# Patient Record
Sex: Male | Born: 1935
Health system: Southern US, Community
[De-identification: ages and names within clinical notes are randomized; demographics above are authoritative.]

## PROBLEM LIST (undated history)

## (undated) DIAGNOSIS — R351 Nocturia: Secondary | ICD-10-CM

## (undated) DIAGNOSIS — M810 Age-related osteoporosis without current pathological fracture: Secondary | ICD-10-CM

## (undated) DIAGNOSIS — G8929 Other chronic pain: Secondary | ICD-10-CM

## (undated) DIAGNOSIS — N529 Male erectile dysfunction, unspecified: Secondary | ICD-10-CM

## (undated) DIAGNOSIS — R310 Gross hematuria: Secondary | ICD-10-CM

## (undated) DIAGNOSIS — I10 Essential (primary) hypertension: Secondary | ICD-10-CM

## (undated) DIAGNOSIS — I4510 Unspecified right bundle-branch block: Secondary | ICD-10-CM

## (undated) DIAGNOSIS — N400 Enlarged prostate without lower urinary tract symptoms: Secondary | ICD-10-CM

## (undated) DIAGNOSIS — M549 Dorsalgia, unspecified: Secondary | ICD-10-CM

## (undated) DIAGNOSIS — K5730 Diverticulosis of large intestine without perforation or abscess without bleeding: Secondary | ICD-10-CM

## (undated) DIAGNOSIS — K219 Gastro-esophageal reflux disease without esophagitis: Secondary | ICD-10-CM

## (undated) DIAGNOSIS — IMO0001 Reserved for inherently not codable concepts without codable children: Secondary | ICD-10-CM

## (undated) HISTORY — DX: Dorsalgia, unspecified: M54.9

## (undated) HISTORY — DX: Gross hematuria: R31.0

## (undated) HISTORY — DX: Essential (primary) hypertension: I10

## (undated) HISTORY — DX: Other chronic pain: G89.29

## (undated) HISTORY — DX: Gastro-esophageal reflux disease without esophagitis: K21.9

---

## 1999-06-16 HISTORY — PX: VERTEBROPLASTY: SHX113

## 2000-01-20 ENCOUNTER — Encounter: Payer: Self-pay | Admitting: Internal Medicine

## 2000-01-20 ENCOUNTER — Emergency Department (HOSPITAL_COMMUNITY): Admission: EM | Admit: 2000-01-20 | Discharge: 2000-01-20 | Payer: Self-pay | Admitting: Internal Medicine

## 2000-03-09 ENCOUNTER — Ambulatory Visit (HOSPITAL_COMMUNITY): Admission: RE | Admit: 2000-03-09 | Discharge: 2000-03-09 | Payer: Self-pay | Admitting: Specialist

## 2000-03-09 ENCOUNTER — Encounter: Payer: Self-pay | Admitting: Specialist

## 2000-03-10 ENCOUNTER — Encounter: Payer: Self-pay | Admitting: Specialist

## 2000-03-10 ENCOUNTER — Ambulatory Visit (HOSPITAL_COMMUNITY): Admission: RE | Admit: 2000-03-10 | Discharge: 2000-03-10 | Payer: Self-pay | Admitting: Specialist

## 2003-05-22 ENCOUNTER — Encounter: Payer: Self-pay | Admitting: Internal Medicine

## 2003-05-22 ENCOUNTER — Ambulatory Visit (HOSPITAL_COMMUNITY): Admission: RE | Admit: 2003-05-22 | Discharge: 2003-05-22 | Payer: Self-pay | Admitting: Gastroenterology

## 2005-08-11 ENCOUNTER — Encounter: Admission: RE | Admit: 2005-08-11 | Discharge: 2005-08-11 | Payer: Self-pay | Admitting: Family Medicine

## 2005-12-30 ENCOUNTER — Ambulatory Visit: Payer: Self-pay | Admitting: Family Medicine

## 2006-05-24 ENCOUNTER — Ambulatory Visit: Payer: Self-pay | Admitting: Family Medicine

## 2006-05-24 LAB — CONVERTED CEMR LAB
Cholesterol: 173 mg/dL (ref 0–200)
Creatinine, Ser: 1.2 mg/dL (ref 0.4–1.5)
Glomerular Filtration Rate, Af Am: 76 mL/min/{1.73_m2}
HDL: 45.2 mg/dL (ref >39.0)
MCHC: 32.9 g/dL (ref 30.0–36.0)
MCV: 95.6 fL (ref 78.0–100.0)
PSA: 2.67 ng/mL (ref 0.10–4.00)
Platelets: 257 10*3/uL (ref 150–400)
Potassium: 3.4 meq/L — ABNORMAL LOW (ref 3.5–5.1)
RDW: 11.5 % (ref 11.5–14.6)
Sodium: 143 meq/L (ref 135–145)
Triglyceride fasting, serum: 89 mg/dL (ref 0–149)
WBC: 7.5 10*3/uL (ref 4.5–10.5)

## 2006-06-02 ENCOUNTER — Ambulatory Visit: Payer: Self-pay | Admitting: Family Medicine

## 2006-06-25 ENCOUNTER — Ambulatory Visit: Payer: Self-pay | Admitting: Family Medicine

## 2007-05-24 ENCOUNTER — Ambulatory Visit: Payer: Self-pay | Admitting: Family Medicine

## 2007-07-12 ENCOUNTER — Telehealth (INDEPENDENT_AMBULATORY_CARE_PROVIDER_SITE_OTHER): Payer: Self-pay | Admitting: *Deleted

## 2007-07-25 ENCOUNTER — Ambulatory Visit: Payer: Self-pay | Admitting: Family Medicine

## 2007-07-25 DIAGNOSIS — N400 Enlarged prostate without lower urinary tract symptoms: Secondary | ICD-10-CM | POA: Insufficient documentation

## 2007-07-25 DIAGNOSIS — I10 Essential (primary) hypertension: Secondary | ICD-10-CM | POA: Insufficient documentation

## 2007-07-28 ENCOUNTER — Telehealth (INDEPENDENT_AMBULATORY_CARE_PROVIDER_SITE_OTHER): Payer: Self-pay | Admitting: *Deleted

## 2007-07-28 LAB — CONVERTED CEMR LAB
CO2: 30 meq/L (ref 19–32)
Calcium: 9.2 mg/dL (ref 8.4–10.5)
Creatinine, Ser: 1.2 mg/dL (ref 0.4–1.5)
GFR calc Af Amer: 76 mL/min
Glucose, Bld: 88 mg/dL (ref 70–99)
HDL: 37.7 mg/dL — ABNORMAL LOW (ref >39.0)
LDL Cholesterol: 114 mg/dL — ABNORMAL HIGH (ref 0–99)
Triglycerides: 118 mg/dL (ref 0–149)

## 2007-08-11 ENCOUNTER — Encounter (INDEPENDENT_AMBULATORY_CARE_PROVIDER_SITE_OTHER): Payer: Self-pay | Admitting: Family Medicine

## 2007-09-16 ENCOUNTER — Encounter: Payer: Self-pay | Admitting: Internal Medicine

## 2008-03-23 ENCOUNTER — Encounter: Payer: Self-pay | Admitting: Internal Medicine

## 2008-03-23 LAB — CONVERTED CEMR LAB: PSA: 2.12 ng/mL

## 2008-03-29 ENCOUNTER — Encounter: Payer: Self-pay | Admitting: Internal Medicine

## 2008-05-02 ENCOUNTER — Ambulatory Visit: Payer: Self-pay | Admitting: Family Medicine

## 2008-06-15 HISTORY — PX: CATARACT EXTRACTION W/ INTRAOCULAR LENS  IMPLANT, BILATERAL: SHX1307

## 2008-08-27 ENCOUNTER — Telehealth (INDEPENDENT_AMBULATORY_CARE_PROVIDER_SITE_OTHER): Payer: Self-pay | Admitting: *Deleted

## 2008-11-02 ENCOUNTER — Emergency Department (HOSPITAL_COMMUNITY): Admission: EM | Admit: 2008-11-02 | Discharge: 2008-11-02 | Payer: Self-pay | Admitting: Emergency Medicine

## 2008-11-07 ENCOUNTER — Telehealth (INDEPENDENT_AMBULATORY_CARE_PROVIDER_SITE_OTHER): Payer: Self-pay | Admitting: *Deleted

## 2008-12-11 ENCOUNTER — Ambulatory Visit: Payer: Self-pay | Admitting: Internal Medicine

## 2008-12-14 LAB — CONVERTED CEMR LAB
CO2: 29 meq/L (ref 19–32)
Chloride: 105 meq/L (ref 96–112)
Glucose, Bld: 101 mg/dL — ABNORMAL HIGH (ref 70–99)
Potassium: 4.1 meq/L (ref 3.5–5.1)
Sodium: 142 meq/L (ref 135–145)

## 2009-03-15 ENCOUNTER — Ambulatory Visit: Payer: Self-pay | Admitting: Internal Medicine

## 2009-05-20 ENCOUNTER — Ambulatory Visit: Payer: Self-pay | Admitting: Internal Medicine

## 2009-05-20 DIAGNOSIS — R972 Elevated prostate specific antigen [PSA]: Secondary | ICD-10-CM | POA: Insufficient documentation

## 2009-05-20 DIAGNOSIS — M1990 Unspecified osteoarthritis, unspecified site: Secondary | ICD-10-CM | POA: Insufficient documentation

## 2009-05-22 ENCOUNTER — Encounter (INDEPENDENT_AMBULATORY_CARE_PROVIDER_SITE_OTHER): Payer: Self-pay | Admitting: *Deleted

## 2009-05-23 ENCOUNTER — Telehealth (INDEPENDENT_AMBULATORY_CARE_PROVIDER_SITE_OTHER): Payer: Self-pay | Admitting: *Deleted

## 2009-05-23 ENCOUNTER — Ambulatory Visit: Payer: Self-pay | Admitting: Internal Medicine

## 2009-05-24 ENCOUNTER — Ambulatory Visit: Payer: Self-pay | Admitting: Internal Medicine

## 2009-05-24 LAB — CONVERTED CEMR LAB
BUN: 16 mg/dL (ref 6–23)
CO2: 32 meq/L (ref 19–32)
Chloride: 101 meq/L (ref 96–112)
Cholesterol: 158 mg/dL (ref 0–200)
Creatinine, Ser: 1.2 mg/dL (ref 0.4–1.5)
Eosinophils Absolute: 0.2 10*3/uL (ref 0.0–0.7)
Eosinophils Relative: 3.3 % (ref 0.0–5.0)
Glucose, Bld: 105 mg/dL — ABNORMAL HIGH (ref 70–99)
HCT: 42.3 % (ref 39.0–52.0)
LDL Cholesterol: 95 mg/dL (ref 0–99)
Lymphs Abs: 2.4 10*3/uL (ref 0.7–4.0)
MCHC: 32.9 g/dL (ref 30.0–36.0)
MCV: 88.6 fL (ref 78.0–100.0)
Monocytes Absolute: 0.7 10*3/uL (ref 0.1–1.0)
Neutrophils Relative %: 49.7 % (ref 43.0–77.0)
PSA: 2.72 ng/mL (ref 0.10–4.00)
Platelets: 222 10*3/uL (ref 150.0–400.0)
Potassium: 3.5 meq/L (ref 3.5–5.1)
Total CHOL/HDL Ratio: 4
Triglycerides: 102 mg/dL (ref 0.0–149.0)

## 2009-05-31 ENCOUNTER — Ambulatory Visit: Payer: Self-pay | Admitting: Internal Medicine

## 2009-06-04 ENCOUNTER — Encounter (INDEPENDENT_AMBULATORY_CARE_PROVIDER_SITE_OTHER): Payer: Self-pay | Admitting: *Deleted

## 2009-06-04 LAB — CONVERTED CEMR LAB: Fecal Occult Bld: NEGATIVE

## 2010-03-14 ENCOUNTER — Ambulatory Visit: Payer: Self-pay | Admitting: Internal Medicine

## 2010-03-24 ENCOUNTER — Emergency Department (HOSPITAL_COMMUNITY): Admission: EM | Admit: 2010-03-24 | Discharge: 2010-03-24 | Payer: Self-pay | Admitting: Emergency Medicine

## 2010-03-24 ENCOUNTER — Telehealth: Payer: Self-pay | Admitting: Internal Medicine

## 2010-03-25 ENCOUNTER — Ambulatory Visit: Payer: Self-pay | Admitting: Internal Medicine

## 2010-03-25 ENCOUNTER — Encounter: Payer: Self-pay | Admitting: Internal Medicine

## 2010-04-25 ENCOUNTER — Ambulatory Visit: Payer: Self-pay | Admitting: Internal Medicine

## 2010-06-18 ENCOUNTER — Other Ambulatory Visit: Payer: Self-pay | Admitting: Internal Medicine

## 2010-06-18 ENCOUNTER — Ambulatory Visit
Admission: RE | Admit: 2010-06-18 | Discharge: 2010-06-18 | Payer: Self-pay | Source: Home / Self Care | Attending: Internal Medicine | Admitting: Internal Medicine

## 2010-06-18 DIAGNOSIS — M25519 Pain in unspecified shoulder: Secondary | ICD-10-CM | POA: Insufficient documentation

## 2010-06-18 DIAGNOSIS — L919 Hypertrophic disorder of the skin, unspecified: Secondary | ICD-10-CM

## 2010-06-18 DIAGNOSIS — L909 Atrophic disorder of skin, unspecified: Secondary | ICD-10-CM | POA: Insufficient documentation

## 2010-06-18 LAB — CBC WITH DIFFERENTIAL/PLATELET
Basophils Absolute: 0 10*3/uL (ref 0.0–0.1)
Basophils Relative: 0.2 % (ref 0.0–3.0)
Eosinophils Absolute: 0.1 10*3/uL (ref 0.0–0.7)
Eosinophils Relative: 1.9 % (ref 0.0–5.0)
HCT: 44.9 % (ref 39.0–52.0)
Hemoglobin: 15.2 g/dL (ref 13.0–17.0)
Lymphocytes Relative: 25.6 % (ref 12.0–46.0)
Lymphs Abs: 1.4 10*3/uL (ref 0.7–4.0)
MCHC: 33.9 g/dL (ref 30.0–36.0)
MCV: 96.4 fl (ref 78.0–100.0)
Monocytes Absolute: 0.5 10*3/uL (ref 0.1–1.0)
Monocytes Relative: 8.4 % (ref 3.0–12.0)
Neutro Abs: 3.5 10*3/uL (ref 1.4–7.7)
Neutrophils Relative %: 63.9 % (ref 43.0–77.0)
Platelets: 195 10*3/uL (ref 150.0–400.0)
RBC: 4.66 Mil/uL (ref 4.22–5.81)
RDW: 12.6 % (ref 11.5–14.6)
WBC: 5.5 10*3/uL (ref 4.5–10.5)

## 2010-06-18 LAB — ALT: ALT: 16 U/L (ref 0–53)

## 2010-06-18 LAB — PSA: PSA: 4.03 ng/mL — ABNORMAL HIGH (ref 0.10–4.00)

## 2010-06-18 LAB — BASIC METABOLIC PANEL
BUN: 16 mg/dL (ref 6–23)
CO2: 31 mEq/L (ref 19–32)
Calcium: 9.3 mg/dL (ref 8.4–10.5)
Chloride: 104 mEq/L (ref 96–112)
Creatinine, Ser: 1.1 mg/dL (ref 0.4–1.5)
GFR: 67.39 mL/min (ref >60.00)
Glucose, Bld: 99 mg/dL (ref 70–99)
Potassium: 3.4 mEq/L — ABNORMAL LOW (ref 3.5–5.1)
Sodium: 142 mEq/L (ref 135–145)

## 2010-06-18 LAB — LIPID PANEL
Cholesterol: 161 mg/dL (ref 0–200)
HDL: 40.3 mg/dL (ref >39.00)
LDL Cholesterol: 110 mg/dL — ABNORMAL HIGH (ref 0–99)
Total CHOL/HDL Ratio: 4
Triglycerides: 52 mg/dL (ref 0.0–149.0)
VLDL: 10.4 mg/dL (ref 0.0–40.0)

## 2010-06-18 LAB — AST: AST: 17 U/L (ref 0–37)

## 2010-06-20 ENCOUNTER — Encounter (INDEPENDENT_AMBULATORY_CARE_PROVIDER_SITE_OTHER): Payer: Self-pay | Admitting: *Deleted

## 2010-07-04 ENCOUNTER — Other Ambulatory Visit: Payer: Self-pay | Admitting: Internal Medicine

## 2010-07-04 ENCOUNTER — Ambulatory Visit
Admission: RE | Admit: 2010-07-04 | Discharge: 2010-07-04 | Payer: Self-pay | Source: Home / Self Care | Attending: Internal Medicine | Admitting: Internal Medicine

## 2010-07-04 LAB — FECAL OCCULT BLOOD, IMMUNOCHEMICAL: Fecal Occult Bld: NEGATIVE

## 2010-07-07 ENCOUNTER — Encounter (INDEPENDENT_AMBULATORY_CARE_PROVIDER_SITE_OTHER): Payer: Self-pay | Admitting: *Deleted

## 2010-07-09 ENCOUNTER — Encounter: Payer: Self-pay | Admitting: Internal Medicine

## 2010-07-15 ENCOUNTER — Encounter: Payer: Self-pay | Admitting: Internal Medicine

## 2010-07-15 NOTE — Assessment & Plan Note (Signed)
Summary: 1 MONTH FOLLOWUP///SPH   Vital Signs:  Patient profile:   77 year old male Weight:      171.50 pounds Pulse rate:   64 / minute Pulse rhythm:   regular BP sitting:   126 / 82  (left arm) Cuff size:   large  Vitals Entered By: [name] CMA (April 25, 2010 1:21 PM) CC: 1 month f/u- not fasting  Comments No complaints.  [facility] [address]   History of Present Illness: followup from the last visit No further syncope or presyncope feelings  Review of systems Feeling well Doing his normal exercises without any problems Denies chest pain, shortness of breath No fever or cough For few days had some abdominal discomfort but  specifically denies abdominal pain ( bloating?) Appetite is normal no heartburn No blood in the stools  Current Medications (verified): 1)  Hydrochlorothiazide 25 Mg  Tabs (Hydrochlorothiazide) .... Take One Tablet Daily 2)  Adult Aspirin Ec Low Strength 81 Mg  Tbec (Aspirin) .... Take One Tablet Daily 3)  Mvi .... Daily  Allergies (verified): 1)  ! * Bee Stings  Past History:  Past Medical History: Reviewed history from 05/20/2009 and no changes required. Hypertension Benign prostatic hypertrophy, elevated PSA  chronic LBP   Past Surgical History: Reviewed history from 12/11/2008 and no changes required. Cataract extraction B , last 2010 Spine Fx after a injury per pt, Vertebroplasty 2001  (reports several DEXAs in the past, results?)  Social History: Retired Married > 50 years, lost wife aprox 5-11. to get married again in few days Never Smoked Alcohol use-no Drug use-no Regular exercise-yes  Physical Exam  General:  alert, well-developed, and well-nourished.   Lungs:  normal respiratory effort, no intercostal retractions, no accessory muscle use, and normal breath sounds.   Heart:  normal rate, regular rhythm, and no murmur.   Abdomen:  soft, non-tender, no distention, no masses, no guarding, and no rigidity.     Extremities:  no edema Psych:  Oriented X3, memory intact for recent and remote, normally interactive, good eye contact, not anxious appearing, and not depressed appearing.     Impression & Recommendations:  Problem # 1:  SYNCOPE AND COLLAPSE (ICD-780.2) resolved asx   Problem # 2:  dyspepsia observation, will call if anything changes  Problem # 3:  due for a  physical, will come back January 2012  Complete Medication List: 1)  Hydrochlorothiazide 25 Mg Tabs (Hydrochlorothiazide) .... Take one tablet daily 2)  Adult Aspirin Ec Low Strength 81 Mg Tbec (Aspirin) .... Take one tablet daily 3)  Mvi  .... Daily  Patient Instructions: 1)  Please schedule a follow-up appointment in 2 months.    Orders Added: 1)  Est. Patient Level III [16109]

## 2010-07-15 NOTE — Assessment & Plan Note (Signed)
Summary: near syncope//fd   Vital Signs:  Patient profile:   77 year old male Height:      66.5 inches Weight:      173.13 pounds BMI:     27.62 Pulse rate:   74 / minute Pulse rhythm:   regular BP sitting:   128 / 82  (left arm) Cuff size:   large  Vitals Entered By: [name] CMA (March 25, 2010 2:57 PM) CC: Pt here went to ER yesterday for chest tightness and dizziness.  Comments had a "black out" spell Sunday. Feeling better today Pharm- [facility] [address]    History of Present Illness: 3 days ago , after a busy day, he felt extremely weak and "about to pass out". Symptoms lasted few minutes and went away without any action. His face was red, his vision was  slightly blurred and he felt slightly nauseous.  He was sitting in his car. this is the first time something like this happens.  he went to the emergency room yesterday, records reviewed: cardiac enzymes negative x2 urinalysis negative Potassium 3.5, creatinine 1.1,blood  sugar 114 CBC showed a white count 7.5, hemoglobin 15.9, platelets 203 Chest x-ray unremarkable EKG or note from the physician --->  not found   review of systems since then episode  3 days ago,  he is feeling essentially well Denies chest pain , palpitations  no vomiting or diarrhea Very mild  headache located at the maxillary sinuses Denies a slurred speech, motor deficits Is not taking any new medicines, he got a flu shot 10 days ago He lost his wife few months ago to cancer, he is still dealing with that problem emotionally but is not extremely anxious or depressed   Current Medications (verified): 1)  Hydrochlorothiazide 25 Mg  Tabs (Hydrochlorothiazide) .... Take One Tablet Daily 2)  Adult Aspirin Ec Low Strength 81 Mg  Tbec (Aspirin) .... Take One Tablet Daily 3)  Mvi .... Daily  Allergies (verified): 1)  ! * Bee Stings  Past History:  Past Medical History: Reviewed history from 05/20/2009 and no changes  required. Hypertension Benign prostatic hypertrophy, elevated PSA  chronic LBP   Past Surgical History: Reviewed history from 12/11/2008 and no changes required. Cataract extraction B , last 2010 Spine Fx after a injury per pt, Vertebroplasty 2001  (reports several DEXAs in the past, results?)  Social History: Reviewed history from 12/11/2008 and no changes required. Retired Married > 50 years, lost wife aprox 5-11 Never Smoked Alcohol use-no Drug use-no Regular exercise-yes  Physical Exam  General:  alert, well-developed, and well-nourished.   Neck:  no masses and normal carotid upstroke.   Lungs:  normal respiratory effort, no intercostal retractions, no accessory muscle use, and normal breath sounds.   Heart:  normal rate, regular rhythm, and no murmur.   Abdomen:  soft, non-tender, no distention, no masses, no guarding, and no rigidity.  no bruit Extremities:  lower extremities without edema, calves symmetric and nontender   Impression & Recommendations:  Problem # 1:  SYNCOPE AND COLLAPSE (ICD-780.2) near syncope few days ago Etiology unclear, ddx includes a TIA, vaso-vagal episode, arrhythmia, etc. EKG today showing sinus rhythm with a RBBB which is not new plan: Echo Carotid ultrasound MRI/MRA Cards referral TSH for completeness ADDENDUM:  states he likes to "wait and see", in no unclear terms I told the patient I disagree and that in my opinion he needs further w/u; he may have a MI-stroke in the future and  we could potentially find out something that we could treat if he goes for further w/u . also told the patient that the fact that his cardiac enzymes were negative at the ER is not a  reassurance that " the heart is okay".  He states he understood but still likes to wait .I asked him to call me if something changes.  Orders: Venipuncture (13086) TLB-TSH (Thyroid Stimulating Hormone) (84443-TSH) EKG w/ Interpretation (93000)  Complete Medication List: 1)   Hydrochlorothiazide 25 Mg Tabs (Hydrochlorothiazide) .... Take one tablet daily 2)  Adult Aspirin Ec Low Strength 81 Mg Tbec (Aspirin) .... Take one tablet daily 3)  Mvi  .... Daily  Patient Instructions: 1)  Please schedule a follow-up appointment in 2 months.

## 2010-07-15 NOTE — Assessment & Plan Note (Signed)
Summary: FLU SHOT///SPH  Nurse Visit   Allergies: 1)  ! * Bee Stings  Orders Added: 1)  Flu Vaccine 39yrs + MEDICARE PATIENTS [Q2039] 2)  Administration Flu vaccine - MCR [G0008] Flu Vaccine Consent Questions     Do you have a history of severe allergic reactions to this vaccine? no    Any prior history of allergic reactions to egg and/or gelatin? no    Do you have a sensitivity to the preservative Thimersol? no    Do you have a past history of Guillan-Barre Syndrome? no    Do you currently have an acute febrile illness? no    Have you ever had a severe reaction to latex? no    Vaccine information given and explained to patient? yes    Are you currently pregnant? no    Lot Number:[id number]   Exp Date:12/13/2010   Site Given  Left Deltoid IM

## 2010-07-15 NOTE — Progress Notes (Signed)
Summary: [facility] REFUSED ED  Phone Note Call from Patient Call back at Home Phone [phone]   Caller: Patient Summary of Call: Pt called c/o of chest tightness and states that it is not a emergency but would like to see dr Drue Novel. Pt denies any sob, numbness or tingling. pt advise ED but refused would prefer to see dr Drue Novel. PT has pending appt for 2:40pm tomorrow. pt advises if symptoms worsen or change in any way he needs to be seen in ED prior to appt ......................Marland KitchenFelecia Deloach CMA  March 24, 2010 11:31 AM   left message to call office to have pt come in today for appt at 2pm.........Marland KitchenFelecia Deloach CMA  March 24, 2010 12:05 PM  left message to call office....................Marland KitchenFelecia Deloach CMA  March 24, 2010 2:40 PM   Follow-up for Phone Call        Pt return call and states that he will keep pending appt for tomorrow...............Marland KitchenFelecia Deloach CMA  March 24, 2010 4:13 PM

## 2010-07-15 NOTE — Miscellaneous (Signed)
Summary: Living Will  Living Will   Imported By: Lanelle Bal 07/25/2009 15:28:35  _____________________________________________________________________  External Attachment:    Type:   Image     Comment:   External Document

## 2010-07-17 NOTE — Letter (Signed)
Summary: [facility]: Immunoassay Fecal Occult Blood (iFOB) Order Form  [facility] [place]  [address] [place], [place] [address]   Phone: [phone]  Fax: [phone]      [facility]: Immunoassay Fecal Occult Blood (iFOB) Order Form   June 20, 2010 MRN: [id number]   Cory Zavala 1931/03/25   Physicican Name:___jose paz,md______________________  Diagnosis Code:______v76.51____________________      [name] CMA

## 2010-07-17 NOTE — Letter (Signed)
Summary: Results Follow up Letter  [facility] [place]  [address] [place], [place] [address]   Phone: [phone]  Fax: [phone]    07/07/2010 MRN: [id number]  Cory Zavala [address] [place], [place]  [address]  Dear Cory Zavala,  The following are the results of your recent test(s):  Test         Result    Pap Smear:        Normal _____  Not Normal _____ Comments: ______________________________________________________ Cholesterol: LDL(Bad cholesterol):         Your goal is less than:         HDL (Good cholesterol):       Your goal is more than: Comments:  ______________________________________________________ Mammogram:        Normal _____  Not Normal _____ Comments:  ___________________________________________________________________ Hemoccult:        Normal __X___  Not normal _______ Comments:    _____________________________________________________________________ Other Tests:    We routinely do not discuss normal results over the telephone.  If you desire a copy of the results, or you have any questions about this information we can discuss them at your next office visit.   Sincerely,

## 2010-07-17 NOTE — Assessment & Plan Note (Signed)
Summary: CPX AND FASTING LABS///SPH   Vital Signs:  Patient profile:   77 year old male Height:      66.5 inches Weight:      167.50 pounds Pulse rate:   79 / minute Pulse rhythm:   regular BP sitting:   126 / 82  (left arm) Cuff size:   large  Vitals Entered By: [name] CMA (June 18, 2010 8:50 AM) CC: CPX, fasting. no complaints  Comments [facility] [address]    History of Present Illness: Here for Medicare AWV:  1.   Risk factors based on Past M, S, F history: reviewed  2.   Physical Activities: walks 2 miles a day, yard work 3.   Depression/mood: denies , no problems noted  4.   Hearing: normal per patient, no problems noted  5.   ADL's: totally independent  6.   Fall Risk: low risk, no h/o falls  7.   Home Safety: does feel safe at home 8.   Height, weight, &visual acuity: see VS, due for yearly eye check next month, h/o cataract                surgery  9.   Counseling: yes 10.   Labs ordered based on risk factors: yes  11.           Referral Coordination, if needed  12.           Care Plan, see a/p  13.            Cognitive Assessment: cognition, motor skills and memory seem appropriate  in addition, we discussed the following  has several skin tags, likes referal to Dr Terri Piedra L shoulder "cracks", some pain, x 6-8 months  Hypertension-- ambulatory BPs wnl , states he checks regulalrly  Benign prostatic hypertrophy, elevated PSA -- asx , has not seen urology since 2009    Preventive Screening-Counseling & Management  Caffeine-Diet-Exercise     Times/week: 7  Current Medications (verified): 1)  Hydrochlorothiazide 25 Mg  Tabs (Hydrochlorothiazide) .... Take One Tablet Daily 2)  Adult Aspirin Ec Low Strength 81 Mg  Tbec (Aspirin) .... Take One Tablet Daily 3)  Mvi .... Daily  Allergies (verified): 1)  ! * Bee Stings  Past History:  Past Medical History: Hypertension Benign prostatic hypertrophy, elevated PSA , last visit to Dr Vernie Ammons 2009 chronic  LBP   Past Surgical History: Reviewed history from 12/11/2008 and no changes required. Cataract extraction B , last 2010 Spine Fx after a injury per pt, Vertebroplasty 2001  (reports several DEXAs in the past, results?)  Family History: Reviewed history from 12/11/2008 and no changes required. colon ca-- no prostate ca--no MI--no   Social History: Retired Married > 50 years, lost wife aprox 5-11. remarried  patient is a Optician, dispensing , Musician Never Smoked Alcohol use-no Drug use-no Regular exercise-yes  Review of Systems CV:  Denies chest pain or discomfort, near fainting, and swelling of feet. Resp:  Denies cough and shortness of breath. GI:  Denies bloody stools, diarrhea, nausea, and vomiting. GU:  Denies dysuria, hematuria, urinary frequency, and urinary hesitancy.  Physical Exam  General:  alert, well-developed, and well-nourished.   Neck:  no masses and no thyromegaly.   Lungs:  normal respiratory effort, no intercostal retractions, no accessory muscle use, and normal breath sounds.   Heart:  normal rate, regular rhythm, and no murmur.   Abdomen:  soft, non-tender, no distention, no masses, no guarding, and no rigidity.  Rectal:  + ext hemorrhoid . Normal sphincter tone. No rectal masses or tenderness. brown stools,hemocult neg  Prostate:  prostate gland  is not tender,it is symmetrically enlarged without nodules. Msk:  right shoulder normal Left shoulder slightly tender at the a.c. joint, range of motion not limited Extremities:  no edema Skin:  several skin tags, has a large wound in the anterior left chest and a small one closed with a left eye   Impression & Recommendations:  Problem # 1:  PREVENTIVE HEALTH CARE (ICD-V70.0)  Td 2001 pneumonia shot 1998 and 05-2009 had a flu shot  shingles shot-- had it already   Cscope 2004 per patient, was told normal, repeat in 2014 Hemoccult negative today provided iFOB  discussed DEXA---not interested   diet  exercise discussed  Orders: Medicare -1st Annual Wellness Visit [phone])  Problem # 2:  SKIN TAG (ICD-701.9)  skin tags Refer to dermatology dr Terri Piedra  Orders: Dermatology Referral (Derma)  Problem # 3:  SHOULDER PAIN (ICD-719.41)  Recommend low dose of Advil or Motrin, GI side effects discussed, as instructions. refer to ortho   His updated medication list for this problem includes:    Adult Aspirin Ec Low Strength 81 Mg Tbec (Aspirin) .Marland Kitchen... Take one tablet daily  Orders: Orthopedic Referral (Ortho)  Problem # 4:  BENIGN PROSTATIC HYPERTROPHY (ICD-600.00)  asymptomatic DRE consistent with enlarged prostate. Labs  Orders: TLB-PSA (Prostate Specific Antigen) (84153-PSA) Specimen Handling (28413)  Problem # 5:  HYPERTENSION (ICD-401.9) at goal  His updated medication list for this problem includes:    Hydrochlorothiazide 25 Mg Tabs (Hydrochlorothiazide) .Marland Kitchen... Take one tablet daily  BP today: 126/82 Prior BP: 126/82 (04/25/2010)  Labs Reviewed: K+: 3.5 (05/23/2009) Creat: : 1.2 (05/23/2009)   Chol: 158 (05/23/2009)   HDL: 42.70 (05/23/2009)   LDL: 95 (05/23/2009)   TG: 102.0 (05/23/2009)  Orders: Venipuncture (24401) TLB-BMP (Basic Metabolic Panel-BMET) (80048-METABOL) TLB-CBC Platelet - w/Differential (85025-CBCD) TLB-ALT (SGPT) (84460-ALT) TLB-AST (SGOT) (84450-SGOT) Specimen Handling (02725)  Complete Medication List: 1)  Hydrochlorothiazide 25 Mg Tabs (Hydrochlorothiazide) .... Take one tablet daily 2)  Adult Aspirin Ec Low Strength 81 Mg Tbec (Aspirin) .... Take one tablet daily 3)  Mvi  .... Daily  Other Orders: TLB-Lipid Panel (80061-LIPID)  Patient Instructions: 1)  ibuprofen 200 mg one or 2 tablets every 6 hours as needed for shoulder pain. Watch for GI side effects. Take it with food 2)  Please schedule a follow-up appointment in 6 months .    Orders Added: 1)  Venipuncture [36415] 2)  TLB-BMP (Basic Metabolic Panel-BMET) [80048-METABOL] 3)   TLB-CBC Platelet - w/Differential [85025-CBCD] 4)  TLB-Lipid Panel [80061-LIPID] 5)  TLB-PSA (Prostate Specific Antigen) [84153-PSA] 6)  TLB-ALT (SGPT) [84460-ALT] 7)  TLB-AST (SGOT) [84450-SGOT] 8)  Specimen Handling [99000] 9)  Dermatology Referral [Derma] 10)  Est. Patient Level III [36644] 11)  Orthopedic Referral [Ortho] 12)  Medicare -1st Annual Wellness Visit [G0438]     Risk Factors:  Exercise:  yes    Times per week:  7

## 2010-08-06 NOTE — Letter (Signed)
Summary: trial rapaflo-levitra[facility]  [facility]   Imported By: Maryln Gottron 07/21/2010 11:05:19  _____________________________________________________________________  External Attachment:    Type:   Image     Comment:   External Document

## 2010-08-12 NOTE — Consult Note (Signed)
Summary: [facility]  [facility]   Imported By: Maryln Gottron 08/06/2010 13:04:12  _____________________________________________________________________  External Attachment:    Type:   Image     Comment:   External Document

## 2010-08-28 LAB — CBC
Platelets: 203 10*3/uL (ref 150–400)
RBC: 4.95 MIL/uL (ref 4.22–5.81)
RDW: 13.6 % (ref 11.5–15.5)
WBC: 7.5 10*3/uL (ref 4.0–10.5)

## 2010-08-28 LAB — POCT CARDIAC MARKERS
CKMB, poc: <1 ng/mL — ABNORMAL LOW (ref 1.0–8.0)
CKMB, poc: <1 ng/mL — ABNORMAL LOW (ref 1.0–8.0)
Myoglobin, poc: 91.1 ng/mL (ref 12–200)
Troponin i, poc: <0.05 ng/mL (ref 0.00–0.09)

## 2010-08-28 LAB — URINALYSIS, ROUTINE W REFLEX MICROSCOPIC
Bilirubin Urine: NEGATIVE
Ketones, ur: NEGATIVE mg/dL
Nitrite: NEGATIVE
Protein, ur: NEGATIVE mg/dL
Urobilinogen, UA: 1 mg/dL (ref 0.0–1.0)

## 2010-08-28 LAB — DIFFERENTIAL
Basophils Absolute: 0 10*3/uL (ref 0.0–0.1)
Lymphocytes Relative: 24 % (ref 12–46)
Lymphs Abs: 1.8 10*3/uL (ref 0.7–4.0)
Neutro Abs: 4.9 10*3/uL (ref 1.7–7.7)

## 2010-08-28 LAB — BASIC METABOLIC PANEL
BUN: 18 mg/dL (ref 6–23)
Chloride: 105 mEq/L (ref 96–112)
Creatinine, Ser: 1.14 mg/dL (ref 0.4–1.5)
GFR calc Af Amer: >60 mL/min (ref >60)
GFR calc non Af Amer: >60 mL/min (ref >60)
Potassium: 3.5 mEq/L (ref 3.5–5.1)

## 2010-09-23 LAB — TROPONIN I: Troponin I: <0.01 ng/mL (ref 0.00–0.06)

## 2010-09-23 LAB — DIFFERENTIAL
Basophils Relative: 0 % (ref 0–1)
Eosinophils Absolute: 0.1 10*3/uL (ref 0.0–0.7)
Lymphs Abs: 1.7 10*3/uL (ref 0.7–4.0)
Neutrophils Relative %: 68 % (ref 43–77)

## 2010-09-23 LAB — CBC
HCT: 41.4 % (ref 39.0–52.0)
Hemoglobin: 13.5 g/dL (ref 13.0–17.0)
MCHC: 32.5 g/dL (ref 30.0–36.0)
RBC: 4.9 MIL/uL (ref 4.22–5.81)

## 2010-09-23 LAB — CK TOTAL AND CKMB (NOT AT ARMC): CK, MB: 1.8 ng/mL (ref 0.3–4.0)

## 2010-09-23 LAB — COMPREHENSIVE METABOLIC PANEL
ALT: 18 U/L (ref 0–53)
Alkaline Phosphatase: 62 U/L (ref 39–117)
BUN: 17 mg/dL (ref 6–23)
CO2: 27 mEq/L (ref 19–32)
Calcium: 9.4 mg/dL (ref 8.4–10.5)
GFR calc non Af Amer: >60 mL/min (ref >60)
Glucose, Bld: 117 mg/dL — ABNORMAL HIGH (ref 70–99)
Sodium: 140 mEq/L (ref 135–145)

## 2010-09-23 LAB — PROTIME-INR
INR: 1.1 (ref 0.00–1.49)
Prothrombin Time: 14.3 seconds (ref 11.6–15.2)

## 2010-09-23 LAB — POCT I-STAT, CHEM 8
BUN: 19 mg/dL (ref 6–23)
Calcium, Ion: 1.17 mmol/L (ref 1.12–1.32)
Chloride: 105 mEq/L (ref 96–112)
Glucose, Bld: 114 mg/dL — ABNORMAL HIGH (ref 70–99)

## 2010-10-28 NOTE — Consult Note (Signed)
NAMEHENDRICKS, SCHWANDT NO.:  [id number]   MEDICAL RECORD NO.:  [id number]          PATIENT TYPE:  EMS   LOCATION:  ED                           FACILITY:  [facility]   PHYSICIAN:  Antony Contras, MD     DATE OF BIRTH:  June 03, 1931   DATE OF CONSULTATION:  11/02/2008  DATE OF DISCHARGE:                                 CONSULTATION   CHIEF COMPLAINT:  Oral bleeding.   HISTORY OF PRESENT ILLNESS:  Patient is a 77 year old white male who  woke up 2 nights ago with blood on his pillow and blood in his mouth.  He has had 2 occasions per night for the last couple of nights as well.  Bleeding is not severe and stops with cold-water rinsing.  He does not  sense any blood in his throat or in his nose when this occurs.  It seems  to be occurring from the mouth.  It has occurred here in the emergency  department.  He denies throat, nose, or mouth pain.   PAST MEDICAL HISTORY:  Hypertension.   PAST SURGICAL HISTORY:  Cataract surgery recently.   FAMILY HISTORY:  None.   SOCIAL HISTORY:  Nonsmoker, nondrinker.  He is a Programmer, multimedia.   REVIEW OF SYSTEMS:  Negative except as listed above.   MEDICATIONS:  Aspirin 81 mg daily.   ALLERGIES:  No known drug allergies.   PHYSICAL EXAMINATION:  Temperature 98.2, blood pressure 176/106, pulse  77, respirations 18.  GENERAL:  Patient is in no acute distress.  He is pleasant and  cooperative.  EYES:  Extraocular movements are intact.  Pupils are equal, round and  reactive to light.  EARS:  External ears are normal.  External canals are patent.  Tympanic  membranes are intact.  Middle ears are aerated.  NOSE:  External nose is normal.  Nasal passages are patent.  The septum  deviates to the right anteriorly at the caudal septum.  There is no dry  blood or fresh blood in the nasal passages anteriorly.  ORAL CAVITY/OROPHARYNX:  The lips are normal.  The teeth are in decent  shape.  There is receding gum of the inferior incisors with  significant  plaque at the root.  There was a little bit of clotted blood around the  bottom of each tooth that was removed.  There is no obvious site of  bleeding.  The tongue and floor of the mouth are normal.  The buccal  mucosa is normal.  The oropharynx is normal.  No evidence of bleeding or  bleeding source.  VOICE:  Normal.  NECK:  Normal to palpation with no mass or tenderness.  LYMPHATIC:  Cervical lymph nodes are normal.  THYROID:  Normal to palpation.  SALIVARY GLANDS:  Normal to palpation.  Cranial nerves II-XII are grossly intact.   LABS:  Hemoglobin is 13.5, PT is 14.3.  PTT is 29.   ASSESSMENT:  Oral cavity bleeding.   PLAN:  I do not see an obvious source of bleeding today.  The patient is  convinced that bleeding is coming from  his oral cavity as opposed to  from his throat or his nose, and this is reasonable.  Since the bleeding  stops so readily with ice water rinses, I recommend continuing that if  it were to recur.  I recommended a dental followup next to evaluate the  teeth more carefully.  I will have the patient follow up with me in 1 to  2 weeks also to see if bleeding continues, and if it does, we may  proceed with fiberoptic laryngopharyngoscopy.      Antony Contras, MD  Electronically Signed     DDB/MEDQ  D:  11/02/2008  T:  11/02/2008  Job:  [id number]

## 2010-10-31 NOTE — Op Note (Signed)
Cory Zavala, Cory Zavala                          ACCOUNT NO.:  [id number]   MEDICAL RECORD NO.:  [id number]                   PATIENT TYPE:  AMB   LOCATION:  ENDO                                 FACILITY:  [facility]   PHYSICIAN:  Graylin Shiver, M.D.                DATE OF BIRTH:  [birth year]   DATE OF PROCEDURE:  05/22/2003  DATE OF DISCHARGE:                                 OPERATIVE REPORT   PROCEDURE:  Screening colonoscopy.   INDICATIONS FOR PROCEDURE:  Screening.   Informed consent was obtained after explanation of the risks of bleeding,  infection and perforation.   PREMEDICATION:  Fentanyl 50 mcg IV, Versed 4 mg IV.   DESCRIPTION OF PROCEDURE:  With the patient in the left lateral decubitus  position, a rectal exam was performed and no masses were felt. The Olympus  colonoscope was inserted into the rectum and advanced around the colon to  the cecum. Cecal landmarks were identified. The cecum and ascending colon  were normal. The transverse colon was normal.  The descending colon showed a  few scattered diverticula.  The sigmoid showed numerous diverticula, the  rectum was normal.  He tolerated the procedure well without complications.   IMPRESSION:  Diverticulosis of the left colon otherwise normal colonoscopy  to the cecum.                                               Graylin Shiver, M.D.    SFG/MEDQ  D:  05/22/2003  T:  05/22/2003  Job:  [id number]   cc:   Thelma Barge P. Modesto Charon, M.D.  [address]  [place]  [place] [address]  Fax: [phone]

## 2010-12-18 ENCOUNTER — Other Ambulatory Visit: Payer: Self-pay | Admitting: Internal Medicine

## 2010-12-24 ENCOUNTER — Emergency Department (HOSPITAL_COMMUNITY)
Admission: EM | Admit: 2010-12-24 | Discharge: 2010-12-24 | Disposition: A | Discharge status: Home / Self Care | Payer: Medicare Other | Attending: Emergency Medicine | Admitting: Emergency Medicine

## 2010-12-24 DIAGNOSIS — T63461A Toxic effect of venom of wasps, accidental (unintentional), initial encounter: Secondary | ICD-10-CM | POA: Insufficient documentation

## 2010-12-24 DIAGNOSIS — T6391XA Toxic effect of contact with unspecified venomous animal, accidental (unintentional), initial encounter: Secondary | ICD-10-CM | POA: Insufficient documentation

## 2010-12-24 DIAGNOSIS — L298 Other pruritus: Secondary | ICD-10-CM | POA: Insufficient documentation

## 2010-12-24 DIAGNOSIS — L2989 Other pruritus: Secondary | ICD-10-CM | POA: Insufficient documentation

## 2010-12-24 DIAGNOSIS — R21 Rash and other nonspecific skin eruption: Secondary | ICD-10-CM | POA: Insufficient documentation

## 2010-12-24 DIAGNOSIS — I10 Essential (primary) hypertension: Secondary | ICD-10-CM | POA: Insufficient documentation

## 2011-03-18 ENCOUNTER — Ambulatory Visit (INDEPENDENT_AMBULATORY_CARE_PROVIDER_SITE_OTHER): Payer: Medicare Other

## 2011-03-18 DIAGNOSIS — Z23 Encounter for immunization: Secondary | ICD-10-CM

## 2011-06-13 ENCOUNTER — Other Ambulatory Visit: Payer: Self-pay | Admitting: Internal Medicine

## 2011-09-01 ENCOUNTER — Encounter: Payer: Self-pay | Admitting: Internal Medicine

## 2011-09-01 ENCOUNTER — Ambulatory Visit (INDEPENDENT_AMBULATORY_CARE_PROVIDER_SITE_OTHER): Payer: Medicare Other | Admitting: Internal Medicine

## 2011-09-01 VITALS — BP 142/86 | HR 60 | Temp 98.0°F | Ht 69.0 in | Wt 168.0 lb

## 2011-09-01 DIAGNOSIS — N529 Male erectile dysfunction, unspecified: Secondary | ICD-10-CM

## 2011-09-01 DIAGNOSIS — Z23 Encounter for immunization: Secondary | ICD-10-CM | POA: Diagnosis not present

## 2011-09-01 DIAGNOSIS — I10 Essential (primary) hypertension: Secondary | ICD-10-CM | POA: Diagnosis not present

## 2011-09-01 DIAGNOSIS — Z0000 Encounter for general adult medical examination without abnormal findings: Secondary | ICD-10-CM

## 2011-09-01 DIAGNOSIS — N400 Enlarged prostate without lower urinary tract symptoms: Secondary | ICD-10-CM

## 2011-09-01 LAB — LIPID PANEL
Cholesterol: 165 mg/dL (ref 0–200)
HDL: 47.6 mg/dL (ref >39.00)
Total CHOL/HDL Ratio: 3
Triglycerides: 64 mg/dL (ref 0.0–149.0)

## 2011-09-01 LAB — BASIC METABOLIC PANEL
CO2: 32 mEq/L (ref 19–32)
Calcium: 9.4 mg/dL (ref 8.4–10.5)
Chloride: 103 mEq/L (ref 96–112)
Sodium: 143 mEq/L (ref 135–145)

## 2011-09-01 NOTE — Assessment & Plan Note (Addendum)
Patient is intolerant to Viagra, not interested on trying other medications. We discussed a vacuum device, not interested either. He plans to discuss further with his urologist. Request a Testosterone level ADDENDUM: at the end liked to try Cialis, a sample of 20 mg provided, rec 1/2 tab qod, do not keep taking if s/e.

## 2011-09-01 NOTE — Progress Notes (Signed)
  Subjective:    Patient ID: Cory Zavala, male    DOB: 1930-07-01, 78 y.o.   MRN: [id number]  HPI Here for Medicare AWV: 1. Risk factors based on Past M, S, F history: reviewed  2. Physical Activities: walks 2 miles a day, yard work 3. Depression/mood: denies , no problems noted  4. Hearing: normal per patient, no problems noted  5. ADL's: totally independent  6. Fall Risk: low risk, prevention discussed  7. Home Safety: does feel safe at home 8. Height, weight, &visual acuity: see VS, h/o cataract surgery , vision good  9. Counseling: yes 10. Labs ordered based on risk factors: yes  11.           Referral Coordination, if needed  12.           Care Plan, see a/p  13.            Cognitive Assessment: cognition, motor skills and memory seem appropriate  in addition, we discussed the following  ED--tried Viagra before, had visual (color) disturbances. He would like his testosterone checked Hypertension-- ambulatory BPs wnl , states he checks regulalrly  Benign prostatic hypertrophy, saw urology 06-2010  , request a PSA  Past Medical History: Hypertension Benign prostatic hypertrophy, elevated PSA , last visit to Dr Vernie Ammons 2009 chronic LBP   Past Surgical History: Cataract extraction B , last 2010 Spine Fx after a injury per pt, Vertebroplasty 2001  (reports several DEXAs in the past)  Family History: colon ca-- no prostate ca--no MI--no  DM-- no Strokes-- no Social History: Retired, patient was a Optician, dispensing still Camera operator, Musician Married > 50 years, lost wife aprox 5-11. remarried  Never Smoked Alcohol use-no Drug use-no Regular exercise-yes  Review of Systems No chest pain or shortness of breath No nausea, vomiting, diarrhea or blood in the stools No difficulty urinating, dysuria or gross hematuria.     Objective:   Physical Exam  General:  alert, well-developed, and well-nourished.   Neck:  no masses and no thyromegaly.  Normal carotid pulses Lungs:   normal respiratory effort, no intercostal retractions, no accessory muscle use, and normal breath sounds.   Heart:  normal rate, regular rhythm, and no murmur.   Abdomen:  soft, non-tender, no distention, no masses, no guarding, and no rigidity.   Extremities:  Trace edema bilaterally, left is about 1 cm larger than the right, a chronic finding according to the patient. Neurologic-- alert & oriented X3 and strength normal in all extremities. Psych-- Cognition and judgment appear intact. Alert and cooperative with normal attention span and concentration.  not anxious appearing and not depressed appearing.       Assessment & Plan:

## 2011-09-01 NOTE — Assessment & Plan Note (Signed)
Labs, no change  

## 2011-09-01 NOTE — Patient Instructions (Signed)
Please see Dr Phoebe Perch, urology , at your earliest convenience

## 2011-09-01 NOTE — Assessment & Plan Note (Signed)
Plans to see Dr Phoebe Perch, will check a PSA per pt request and forward to urology

## 2011-09-01 NOTE — Assessment & Plan Note (Addendum)
Td 2001 and today pneumonia shot 1998 and 05-2009 shingles shot-- 2010  Cscope 2004 per patient, was told normal, repeat in 2014 Neg  iFOB 2-12 , iFOB provided today  H/o a vertebroplasty, discussed DEXA---not interested  At this time  diet exercise discussed

## 2011-09-02 ENCOUNTER — Telehealth: Payer: Self-pay | Admitting: *Deleted

## 2011-09-02 LAB — TESTOSTERONE, FREE, TOTAL, SHBG
Sex Hormone Binding: 41 nmol/L (ref 13–71)
Testosterone-% Free: 1.7 % (ref 1.6–2.9)

## 2011-09-04 MED ORDER — POTASSIUM CHLORIDE ER 10 MEQ PO TBCR: 10.0000 meq | EXTENDED_RELEASE_TABLET | Freq: Two times a day (BID) | ORAL | Status: DC

## 2011-09-04 NOTE — Progress Notes (Signed)
Addended by: Edwena Felty T on: 09/04/2011 04:57 PM   Modules accepted: Orders

## 2011-09-12 ENCOUNTER — Other Ambulatory Visit: Payer: Self-pay | Admitting: Internal Medicine

## 2011-09-14 NOTE — Telephone Encounter (Signed)
Refill done.  

## 2011-09-30 ENCOUNTER — Ambulatory Visit: Payer: Medicare Other | Admitting: *Deleted

## 2011-09-30 DIAGNOSIS — K921 Melena: Secondary | ICD-10-CM

## 2011-09-30 DIAGNOSIS — Z1211 Encounter for screening for malignant neoplasm of colon: Secondary | ICD-10-CM

## 2011-09-30 LAB — FECAL OCCULT BLOOD, IMMUNOCHEMICAL: Fecal Occult Bld: POSITIVE

## 2011-10-20 NOTE — Telephone Encounter (Signed)
Error

## 2011-10-26 ENCOUNTER — Other Ambulatory Visit (INDEPENDENT_AMBULATORY_CARE_PROVIDER_SITE_OTHER): Payer: PRIVATE HEALTH INSURANCE

## 2011-10-26 DIAGNOSIS — I10 Essential (primary) hypertension: Secondary | ICD-10-CM | POA: Diagnosis not present

## 2011-10-26 LAB — POTASSIUM: Potassium: 3.2 mEq/L — ABNORMAL LOW (ref 3.5–5.1)

## 2011-10-26 NOTE — Progress Notes (Signed)
Labs only

## 2011-10-27 ENCOUNTER — Telehealth: Payer: Self-pay

## 2011-10-27 NOTE — Telephone Encounter (Signed)
Left message on voicemail for patient to return call, copy of labs mailed to patient

## 2011-10-27 NOTE — Telephone Encounter (Signed)
Message copied by Maurice Small on Tue Oct 27, 2011  4:31 PM ------      Message from: Cory Zavala      Created: Mon Oct 26, 2011  5:10 PM       Increase KCl from 10 mEq to 20 meq--->  1 by mouth daily #30 and 5 refills.      Check a potassium in 6 weeks, dx htn

## 2011-10-28 MED ORDER — POTASSIUM CHLORIDE CRYS ER 20 MEQ PO TBCR: 20.0000 meq | EXTENDED_RELEASE_TABLET | Freq: Every day | ORAL | Status: DC

## 2011-10-28 NOTE — Telephone Encounter (Signed)
Refill done. Mailed pt lab results.

## 2011-12-02 DIAGNOSIS — H26499 Other secondary cataract, unspecified eye: Secondary | ICD-10-CM | POA: Diagnosis not present

## 2011-12-02 DIAGNOSIS — H43819 Vitreous degeneration, unspecified eye: Secondary | ICD-10-CM | POA: Diagnosis not present

## 2011-12-09 DIAGNOSIS — H43819 Vitreous degeneration, unspecified eye: Secondary | ICD-10-CM | POA: Diagnosis not present

## 2011-12-15 ENCOUNTER — Other Ambulatory Visit: Payer: Self-pay | Admitting: Internal Medicine

## 2011-12-15 NOTE — Telephone Encounter (Signed)
Refill done.  

## 2011-12-18 DIAGNOSIS — R972 Elevated prostate specific antigen [PSA]: Secondary | ICD-10-CM | POA: Diagnosis not present

## 2011-12-24 DIAGNOSIS — R972 Elevated prostate specific antigen [PSA]: Secondary | ICD-10-CM | POA: Diagnosis not present

## 2011-12-24 DIAGNOSIS — N138 Other obstructive and reflux uropathy: Secondary | ICD-10-CM | POA: Diagnosis not present

## 2011-12-24 DIAGNOSIS — N401 Enlarged prostate with lower urinary tract symptoms: Secondary | ICD-10-CM | POA: Diagnosis not present

## 2011-12-24 DIAGNOSIS — N529 Male erectile dysfunction, unspecified: Secondary | ICD-10-CM | POA: Diagnosis not present

## 2011-12-29 ENCOUNTER — Encounter: Payer: Self-pay | Admitting: Internal Medicine

## 2011-12-29 ENCOUNTER — Ambulatory Visit (INDEPENDENT_AMBULATORY_CARE_PROVIDER_SITE_OTHER): Payer: Medicare Other | Admitting: Internal Medicine

## 2011-12-29 VITALS — BP 138/76 | HR 72 | Wt 164.0 lb

## 2011-12-29 DIAGNOSIS — I10 Essential (primary) hypertension: Secondary | ICD-10-CM | POA: Diagnosis not present

## 2011-12-29 DIAGNOSIS — R109 Unspecified abdominal pain: Secondary | ICD-10-CM | POA: Diagnosis not present

## 2011-12-29 NOTE — Assessment & Plan Note (Signed)
Potassium has been low, I thought we increase KCl from 10 mEq to 20 mEq however he was already   taking 10 mEq twice a day. Plan: Check another potassium level, consider switch to Maxzide

## 2011-12-29 NOTE — Assessment & Plan Note (Addendum)
Pain is most likely muscular in nature; he does have a midline supraumbilical hernia but that seems to be asymptomatic. Plan is observation. We discussed the symptoms of hernia incarceration.

## 2011-12-29 NOTE — Progress Notes (Signed)
  Subjective:    Patient ID: Cory Zavala, male    DOB: 11-Jul-1930, 78 y.o.   MRN: [id number]  HPI Here for the evaluation of abdominal pain. The last 3 times he uses a push lawnmower, he developed pain at the left side of the abdomen. The pain starts 30 minutes into the job and stops after he finishes. There is no bulging area at the site of pain. Also, likes his potassium rechecked.  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 remarried   Never Smoked Alcohol use-no Drug use-no Regular exercise-yes   Review of Systems No nausea, vomiting, diarrhea. No thoracic of lumbar spine pain. No testicular pain    Objective:   Physical Exam  Constitutional: He is oriented to person, place, and time. He appears well-developed and well-nourished. No distress.  Abdominal:         Abdomen is soft, not distended, not tender, good bowel sounds. He has a supraumbilical midline hernia which is large but reducible. The area of the perceived pain is normal to palpation. There is no inguinal hernias.  Musculoskeletal: He exhibits no edema.  Neurological: He is alert and oriented to person, place, and time.  Skin: He is not diaphoretic.  Psychiatric: He has a normal mood and affect. His behavior is normal. Judgment and thought content normal.       Assessment & Plan:

## 2012-01-04 MED ORDER — TRIAMTERENE-HCTZ 37.5-25 MG PO TABS: 1.0000 | ORAL_TABLET | Freq: Every day | ORAL | Status: DC

## 2012-01-04 NOTE — Addendum Note (Signed)
Addended by: Edwena Felty T on: 01/04/2012 02:48 PM   Modules accepted: Orders

## 2012-01-11 DIAGNOSIS — H26499 Other secondary cataract, unspecified eye: Secondary | ICD-10-CM | POA: Diagnosis not present

## 2012-01-11 DIAGNOSIS — H43819 Vitreous degeneration, unspecified eye: Secondary | ICD-10-CM | POA: Diagnosis not present

## 2012-02-11 ENCOUNTER — Telehealth: Payer: Self-pay | Admitting: Internal Medicine

## 2012-02-11 NOTE — Telephone Encounter (Signed)
Pt called stated he has been having terrible pain in his back and wanted to know whom you would recommend him to. I advised pt with medicare he would probably need a referral and he would have to have been seen within the past six months for that particular condition. Can you recommend someone or will pt need to come in here first?  CB [phone]

## 2012-02-12 ENCOUNTER — Ambulatory Visit (INDEPENDENT_AMBULATORY_CARE_PROVIDER_SITE_OTHER): Payer: Medicare Other | Admitting: Internal Medicine

## 2012-02-12 ENCOUNTER — Other Ambulatory Visit: Payer: Self-pay | Admitting: Internal Medicine

## 2012-02-12 ENCOUNTER — Ambulatory Visit (INDEPENDENT_AMBULATORY_CARE_PROVIDER_SITE_OTHER)
Admission: RE | Admit: 2012-02-12 | Discharge: 2012-02-12 | Disposition: A | Discharge status: Home / Self Care | Payer: Medicare Other | Source: Ambulatory Visit | Attending: Internal Medicine | Admitting: Internal Medicine

## 2012-02-12 VITALS — BP 118/72 | HR 58 | Temp 98.1°F | Wt 164.0 lb

## 2012-02-12 DIAGNOSIS — M549 Dorsalgia, unspecified: Secondary | ICD-10-CM

## 2012-02-12 DIAGNOSIS — I10 Essential (primary) hypertension: Secondary | ICD-10-CM

## 2012-02-12 DIAGNOSIS — M5137 Other intervertebral disc degeneration, lumbosacral region: Secondary | ICD-10-CM | POA: Diagnosis not present

## 2012-02-12 LAB — BASIC METABOLIC PANEL
BUN: 21 mg/dL (ref 6–23)
Calcium: 9.2 mg/dL (ref 8.4–10.5)
Creatinine, Ser: 1.3 mg/dL (ref 0.4–1.5)
GFR: 56.5 mL/min — ABNORMAL LOW (ref >60.00)
Glucose, Bld: 118 mg/dL — ABNORMAL HIGH (ref 70–99)
Potassium: 3.7 mEq/L (ref 3.5–5.1)

## 2012-02-12 MED ORDER — PREDNISONE 10 MG PO TABS: ORAL_TABLET | ORAL | Status: DC

## 2012-02-12 MED ORDER — HYDROCODONE-ACETAMINOPHEN 7.5-750 MG PO TABS: 1.0000 | ORAL_TABLET | Freq: Three times a day (TID) | ORAL | Status: AC | PRN

## 2012-02-12 NOTE — Progress Notes (Signed)
  Subjective:    Patient ID: Cory Zavala, male    DOB: November 19, 1930, 78 y.o.   MRN: [id number]  HPI Acute visit 5 days ago he was trying to crank up his lawnmower and he had an acute onset of back pain, since then the pain is on and off, sometimes as severe as 10/10. No radiation, Tylenol is not helping much. He denies any fever or chills. No bladder or bowel incontinence or tingling in his toes He reports that on a recent urology checkup was told his PSA was okay and he needed to followup as needed.  Also, we recently changed his BP medications do to hypoK+, currently on Maxzide, good compliance and tolerance, reports normal ambulatory BPs. Due for a BMP.  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 remarried   Never Smoked Alcohol use-no Drug use-no Regular exercise-yes   Review of Systems See HPI     Objective:   Physical Exam  Musculoskeletal:       Arms:  General -- alert, well-developed, antalgic posture are noted particularly when he tries to lay down on the table  Abdomen--soft, non-tender, no distention, no masses, no HSM, no guarding, and no rigidity.   Extremities-- no pretibial edema bilaterally  Neurologic-- alert & oriented X3 ; motor strength: Slightly decreased leg elevation, I think related to pain. Knee jerks symmetric, ankle jerk slightly decreased on the left. Straight leg test negative. Psych-- Cognition and judgment appear intact. Alert and cooperative with normal attention span and concentration.  not anxious appearing and not  depressed appearing.      Assessment & Plan:

## 2012-02-12 NOTE — Patient Instructions (Addendum)
Please get your x-ray at the other [facility]  office located at: [address] [place], across from [facility].  Please go to the basement, this is a walk-in facility, they are open from 8:30 to 5:30 PM. Phone number [phone]. ---- Prednisone as prescribed  For pain use Tylenol 500 mg 2 tablets 3 times a day. If the pain is severe, instead of Tylenol take Vicodin (which has Tylenol on it already) Call anytime if you get worse Call in 10 days if you're not improving.

## 2012-02-12 NOTE — Assessment & Plan Note (Addendum)
due to hypoK+, we discontinue potassium supplements and HCTZ, currently on Maxzide, seems to be well-controlled Plan: Continue Maxzide, BMP

## 2012-02-12 NOTE — Assessment & Plan Note (Addendum)
Acute onset of back pain in a 78 year old gentleman with a history of a vertebral compression fracture. Neurological exam show a slightly decreased left ankle jerk but fortunately he denies bladder or bowel incontinence or fevers. Reports that recent PSA has been okay. DDX includes sprain versus vertebral fracture versus others. Plan: antiinflammatory treatment with prednisone Pain control with Tylenol or hydrocodone. Warned about side effects. X-rays If not better, will need further eval.

## 2012-02-17 DIAGNOSIS — M5137 Other intervertebral disc degeneration, lumbosacral region: Secondary | ICD-10-CM | POA: Diagnosis not present

## 2012-02-17 DIAGNOSIS — M999 Biomechanical lesion, unspecified: Secondary | ICD-10-CM | POA: Diagnosis not present

## 2012-02-18 DIAGNOSIS — M5137 Other intervertebral disc degeneration, lumbosacral region: Secondary | ICD-10-CM | POA: Diagnosis not present

## 2012-02-18 DIAGNOSIS — M999 Biomechanical lesion, unspecified: Secondary | ICD-10-CM | POA: Diagnosis not present

## 2012-02-22 DIAGNOSIS — M5137 Other intervertebral disc degeneration, lumbosacral region: Secondary | ICD-10-CM | POA: Diagnosis not present

## 2012-02-22 DIAGNOSIS — M999 Biomechanical lesion, unspecified: Secondary | ICD-10-CM | POA: Diagnosis not present

## 2012-02-23 DIAGNOSIS — M999 Biomechanical lesion, unspecified: Secondary | ICD-10-CM | POA: Diagnosis not present

## 2012-02-23 DIAGNOSIS — M5137 Other intervertebral disc degeneration, lumbosacral region: Secondary | ICD-10-CM | POA: Diagnosis not present

## 2012-02-24 DIAGNOSIS — M999 Biomechanical lesion, unspecified: Secondary | ICD-10-CM | POA: Diagnosis not present

## 2012-02-24 DIAGNOSIS — M5137 Other intervertebral disc degeneration, lumbosacral region: Secondary | ICD-10-CM | POA: Diagnosis not present

## 2012-02-29 DIAGNOSIS — M5137 Other intervertebral disc degeneration, lumbosacral region: Secondary | ICD-10-CM | POA: Diagnosis not present

## 2012-02-29 DIAGNOSIS — M999 Biomechanical lesion, unspecified: Secondary | ICD-10-CM | POA: Diagnosis not present

## 2012-03-01 DIAGNOSIS — M5137 Other intervertebral disc degeneration, lumbosacral region: Secondary | ICD-10-CM | POA: Diagnosis not present

## 2012-03-01 DIAGNOSIS — M999 Biomechanical lesion, unspecified: Secondary | ICD-10-CM | POA: Diagnosis not present

## 2012-03-02 DIAGNOSIS — M999 Biomechanical lesion, unspecified: Secondary | ICD-10-CM | POA: Diagnosis not present

## 2012-03-02 DIAGNOSIS — M5137 Other intervertebral disc degeneration, lumbosacral region: Secondary | ICD-10-CM | POA: Diagnosis not present

## 2012-03-07 DIAGNOSIS — M5137 Other intervertebral disc degeneration, lumbosacral region: Secondary | ICD-10-CM | POA: Diagnosis not present

## 2012-03-07 DIAGNOSIS — M999 Biomechanical lesion, unspecified: Secondary | ICD-10-CM | POA: Diagnosis not present

## 2012-03-08 DIAGNOSIS — M5137 Other intervertebral disc degeneration, lumbosacral region: Secondary | ICD-10-CM | POA: Diagnosis not present

## 2012-03-08 DIAGNOSIS — M999 Biomechanical lesion, unspecified: Secondary | ICD-10-CM | POA: Diagnosis not present

## 2012-03-09 DIAGNOSIS — M5137 Other intervertebral disc degeneration, lumbosacral region: Secondary | ICD-10-CM | POA: Diagnosis not present

## 2012-03-09 DIAGNOSIS — M999 Biomechanical lesion, unspecified: Secondary | ICD-10-CM | POA: Diagnosis not present

## 2012-03-14 DIAGNOSIS — M5137 Other intervertebral disc degeneration, lumbosacral region: Secondary | ICD-10-CM | POA: Diagnosis not present

## 2012-03-14 DIAGNOSIS — M999 Biomechanical lesion, unspecified: Secondary | ICD-10-CM | POA: Diagnosis not present

## 2012-03-15 DIAGNOSIS — M999 Biomechanical lesion, unspecified: Secondary | ICD-10-CM | POA: Diagnosis not present

## 2012-03-15 DIAGNOSIS — M5137 Other intervertebral disc degeneration, lumbosacral region: Secondary | ICD-10-CM | POA: Diagnosis not present

## 2012-03-16 DIAGNOSIS — M5137 Other intervertebral disc degeneration, lumbosacral region: Secondary | ICD-10-CM | POA: Diagnosis not present

## 2012-03-16 DIAGNOSIS — M999 Biomechanical lesion, unspecified: Secondary | ICD-10-CM | POA: Diagnosis not present

## 2012-03-17 ENCOUNTER — Telehealth: Payer: Self-pay

## 2012-03-17 ENCOUNTER — Ambulatory Visit (INDEPENDENT_AMBULATORY_CARE_PROVIDER_SITE_OTHER): Payer: Medicare Other

## 2012-03-17 DIAGNOSIS — Z23 Encounter for immunization: Secondary | ICD-10-CM

## 2012-03-17 NOTE — Telephone Encounter (Signed)
While pt was here for flu shot today during flu clinic he asked lab results and xrays to be mailed. I am mailing them per request.    MW

## 2012-03-22 DIAGNOSIS — M9981 Other biomechanical lesions of cervical region: Secondary | ICD-10-CM | POA: Diagnosis not present

## 2012-03-23 DIAGNOSIS — M9981 Other biomechanical lesions of cervical region: Secondary | ICD-10-CM | POA: Diagnosis not present

## 2012-03-28 ENCOUNTER — Other Ambulatory Visit: Payer: Self-pay | Admitting: Internal Medicine

## 2012-03-28 DIAGNOSIS — M9981 Other biomechanical lesions of cervical region: Secondary | ICD-10-CM | POA: Diagnosis not present

## 2012-03-28 NOTE — Telephone Encounter (Signed)
Refill done.  

## 2012-03-30 DIAGNOSIS — M9981 Other biomechanical lesions of cervical region: Secondary | ICD-10-CM | POA: Diagnosis not present

## 2012-04-04 DIAGNOSIS — M9981 Other biomechanical lesions of cervical region: Secondary | ICD-10-CM | POA: Diagnosis not present

## 2012-04-12 DIAGNOSIS — M9981 Other biomechanical lesions of cervical region: Secondary | ICD-10-CM | POA: Diagnosis not present

## 2012-04-19 DIAGNOSIS — M9981 Other biomechanical lesions of cervical region: Secondary | ICD-10-CM | POA: Diagnosis not present

## 2012-05-02 ENCOUNTER — Other Ambulatory Visit: Payer: Self-pay | Admitting: Internal Medicine

## 2012-05-02 NOTE — Telephone Encounter (Signed)
Spoke with pharmacy & pt still has refills left on file. Refill request sent in error.

## 2012-05-03 DIAGNOSIS — M9981 Other biomechanical lesions of cervical region: Secondary | ICD-10-CM | POA: Diagnosis not present

## 2012-05-17 DIAGNOSIS — M9981 Other biomechanical lesions of cervical region: Secondary | ICD-10-CM | POA: Diagnosis not present

## 2012-05-31 DIAGNOSIS — A088 Other specified intestinal infections: Secondary | ICD-10-CM | POA: Diagnosis not present

## 2012-06-29 DIAGNOSIS — M9981 Other biomechanical lesions of cervical region: Secondary | ICD-10-CM | POA: Diagnosis not present

## 2012-09-15 ENCOUNTER — Encounter: Payer: Self-pay | Admitting: Internal Medicine

## 2012-09-15 ENCOUNTER — Ambulatory Visit (INDEPENDENT_AMBULATORY_CARE_PROVIDER_SITE_OTHER): Payer: Medicare Other | Admitting: Internal Medicine

## 2012-09-15 VITALS — BP 112/70 | HR 61 | Temp 98.0°F | Ht 68.5 in | Wt 163.0 lb

## 2012-09-15 DIAGNOSIS — N400 Enlarged prostate without lower urinary tract symptoms: Secondary | ICD-10-CM | POA: Diagnosis not present

## 2012-09-15 DIAGNOSIS — Z0000 Encounter for general adult medical examination without abnormal findings: Secondary | ICD-10-CM

## 2012-09-15 DIAGNOSIS — I10 Essential (primary) hypertension: Secondary | ICD-10-CM | POA: Diagnosis not present

## 2012-09-15 DIAGNOSIS — R739 Hyperglycemia, unspecified: Secondary | ICD-10-CM

## 2012-09-15 DIAGNOSIS — R7309 Other abnormal glucose: Secondary | ICD-10-CM

## 2012-09-15 DIAGNOSIS — K219 Gastro-esophageal reflux disease without esophagitis: Secondary | ICD-10-CM

## 2012-09-15 DIAGNOSIS — R972 Elevated prostate specific antigen [PSA]: Secondary | ICD-10-CM | POA: Diagnosis not present

## 2012-09-15 DIAGNOSIS — R079 Chest pain, unspecified: Secondary | ICD-10-CM | POA: Diagnosis not present

## 2012-09-15 HISTORY — DX: Gastro-esophageal reflux disease without esophagitis: K21.9

## 2012-09-15 LAB — CBC WITH DIFFERENTIAL/PLATELET
Basophils Absolute: 0 10*3/uL (ref 0.0–0.1)
HCT: 45.8 % (ref 39.0–52.0)
Hemoglobin: 15.4 g/dL (ref 13.0–17.0)
Lymphs Abs: 2 10*3/uL (ref 0.7–4.0)
MCHC: 33.5 g/dL (ref 30.0–36.0)
MCV: 94.3 fl (ref 78.0–100.0)
Monocytes Absolute: 0.4 10*3/uL (ref 0.1–1.0)
Monocytes Relative: 7.5 % (ref 3.0–12.0)
Neutro Abs: 3.4 10*3/uL (ref 1.4–7.7)
Platelets: 203 10*3/uL (ref 150.0–400.0)
RDW: 12.9 % (ref 11.5–14.6)

## 2012-09-15 LAB — LIPID PANEL
Cholesterol: 160 mg/dL (ref 0–200)
HDL: 41.6 mg/dL (ref >39.00)
Triglycerides: 65 mg/dL (ref 0.0–149.0)

## 2012-09-15 LAB — HEMOGLOBIN A1C: Hgb A1c MFr Bld: 5.9 % (ref 4.6–6.5)

## 2012-09-15 LAB — BASIC METABOLIC PANEL
CO2: 28 mEq/L (ref 19–32)
Calcium: 9.1 mg/dL (ref 8.4–10.5)
Creatinine, Ser: 1.1 mg/dL (ref 0.4–1.5)
GFR: 66.32 mL/min (ref >60.00)
Sodium: 137 mEq/L (ref 135–145)

## 2012-09-15 MED ORDER — NITROGLYCERIN 0.4 MG SL SUBL: 0.4000 mg | SUBLINGUAL_TABLET | SUBLINGUAL | Status: DC | PRN

## 2012-09-15 NOTE — Assessment & Plan Note (Addendum)
Td 2013 pneumonia shot 1998 and 05-2009 shingles shot-- 2010  Cscope 2004 per patient, was told normal  + iFOB last year was referred to GI, did not go ("i don't think there is anything wrong"), explained patient that a + IFOB may be due to colon cancer: plan to send him to GI again once cardiac eval is completed, see CP H/o a vertebroplasty, will discuss after cards eval and GI eval  Mild hyperglycemia per chart review--- check a A1C diet exercise discussed

## 2012-09-15 NOTE — Assessment & Plan Note (Signed)
Well-controlled,  check a BMP 

## 2012-09-15 NOTE — Assessment & Plan Note (Addendum)
Chest pain for one year, needs further evaluation likely a stress test and echocardiogram given presyncopal episode. EKG today: RBBB, not new. Plan:  Refer to cardiology. If chest visit here were last more than 5 minutes some nitroglycerin and go to the ER. Pt quite reluctant to have further eval, eventually agreed to se cards

## 2012-09-15 NOTE — Assessment & Plan Note (Signed)
Asx, labs  

## 2012-09-15 NOTE — Patient Instructions (Signed)
If chest pain take nitroglyderine up to 3 times every 5 minutes If the pain is severe or not better with nitro, cal 911   Fall Prevention and Home Safety Falls cause injuries and can affect all age groups. It is possible to use preventive measures to significantly decrease the likelihood of falls. There are many simple measures which can make your home safer and prevent falls. OUTDOORS  Repair cracks and edges of walkways and driveways.  Remove high doorway thresholds.  Trim shrubbery on the main path into your home.  Have good outside lighting.  Clear walkways of tools, rocks, debris, and clutter.  Check that handrails are not broken and are securely fastened. Both sides of steps should have handrails.  Have leaves, snow, and ice cleared regularly.  Use sand or salt on walkways during winter months.  In the garage, clean up grease or oil spills. BATHROOM  Install night lights.  Install grab bars by the toilet and in the tub and shower.  Use non-skid mats or decals in the tub or shower.  Place a plastic non-slip stool in the shower to sit on, if needed.  Keep floors dry and clean up all water on the floor immediately.  Remove soap buildup in the tub or shower on a regular basis.  Secure bath mats with non-slip, double-sided rug tape.  Remove throw rugs and tripping hazards from the floors. BEDROOMS  Install night lights.  Make sure a bedside light is easy to reach.  Do not use oversized bedding.  Keep a telephone by your bedside.  Have a firm chair with side arms to use for getting dressed.  Remove throw rugs and tripping hazards from the floor. KITCHEN  Keep handles on pots and pans turned toward the center of the stove. Use back burners when possible.  Clean up spills quickly and allow time for drying.  Avoid walking on wet floors.  Avoid hot utensils and knives.  Position shelves so they are not too high or low.  Place commonly used objects within  easy reach.  If necessary, use a sturdy step stool with a grab bar when reaching.  Keep electrical cables out of the way.  Do not use floor polish or wax that makes floors slippery. If you must use wax, use non-skid floor wax.  Remove throw rugs and tripping hazards from the floor. STAIRWAYS  Never leave objects on stairs.  Place handrails on both sides of stairways and use them. Fix any loose handrails. Make sure handrails on both sides of the stairways are as long as the stairs.  Check carpeting to make sure it is firmly attached along stairs. Make repairs to worn or loose carpet promptly.  Avoid placing throw rugs at the top or bottom of stairways, or properly secure the rug with carpet tape to prevent slippage. Get rid of throw rugs, if possible.  Have an electrician put in a light switch at the top and bottom of the stairs. OTHER FALL PREVENTION TIPS  Wear low-heel or rubber-soled shoes that are supportive and fit well. Wear closed toe shoes.  When using a stepladder, make sure it is fully opened and both spreaders are firmly locked. Do not climb a closed stepladder.  Add color or contrast paint or tape to grab bars and handrails in your home. Place contrasting color strips on first and last steps.  Learn and use mobility aids as needed. Install an electrical emergency response system.  Turn on lights to avoid dark  areas. Replace light bulbs that burn out immediately. Get light switches that glow.  Arrange furniture to create clear pathways. Keep furniture in the same place.  Firmly attach carpet with non-skid or double-sided tape.  Eliminate uneven floor surfaces.  Select a carpet pattern that does not visually hide the edge of steps.  Be aware of all pets. OTHER HOME SAFETY TIPS  Set the water temperature for 120 F (48.8 C).  Keep emergency numbers on or near the telephone.  Keep smoke detectors on every level of the home and near sleeping areas. Document  Released: 05/22/2002 Document Revised: 12/01/2011 Document Reviewed: 08/21/2011 [facility] Patient Information 2013 [place], [place].

## 2012-09-15 NOTE — Progress Notes (Signed)
Subjective:    Patient ID: Cory Zavala, male    DOB: 09-Oct-1930, 79 y.o.   MRN: [id number]  HPI  Here for Medicare AWV: 1.         Risk factors based on Past M, S, F history: reviewed   2.         Physical Activities: walks 2 miles a day, yard work 3.         Depression/mood: (-) screening   4.         Hearing: normal per patient, no problems noted   5.         ADL's: totally independent   6.         Fall Risk: low risk, prevention discussed , see instructions   7.         Home Safety: does feel safe at home 8.         Height, weight, &visual acuity: see VS, h/o cataract surgery , vision good   9.         Counseling: yes 10.       Labs ordered based on risk factors: yes   11.       Referral Coordination, if needed   12.       Care Plan, see a/p   13.        Cognitive Assessment: cognition, motor skills and memory seem appropriate  in addition, we discussed the following Hypertension, good medication compliance, ambulatory BPs around 117/77. BPH, asymptomatic, denies dysuria, difficulty urinating or hematuria. Back pain, not a major issue at this point, sees a chiropractor from time to time. He also reports chest pain: Pain is described as a tightness in the left anterior chest, symptoms going on for one year, happened on average every 2 weeks, usually late at night, no associated shortness or breath, no radiation, last a few minutes. Otherwise able to take walks w/o CP. Also, a month ago had episode of near-syncope: Was sitting, talking with a friend, he fell he "was about to pass out"; did not experience slurred speech, facial numbness, chest pain, palpitations. Symptoms lasted 20 seconds, he stood up started walking and felt better.    Past Medical History  Diagnosis Date  . HTN (hypertension)   . BPH (benign prostatic hyperplasia)   . Back pain, chronic   . GERD (gastroesophageal reflux disease) 09/15/2012   Past Surgical History  Procedure Laterality Date  . Cataract  extraction Bilateral 2010  . Vertebroplasty  2001   History   Social History  . Marital Status: Married    Spouse Name: N/A    Number of Children: 2  . Years of Education: N/A   Occupational History  . pastor, semi retired     Social History Main Topics  . Smoking status: Never Smoker   . Smokeless tobacco: Never Used  . Alcohol Use: No  . Drug Use: No  . Sexually Active: Not on file   Other Topics Concern  . Not on file   Social History Narrative   Retired, patient was a Optician, dispensing still preachs, Musician   Married > 50 years, lost wife aprox 5-11. remarried         Family History  Problem Relation Age of Onset  . CAD Neg Hx   . Diabetes Neg Hx   . Colon cancer Neg Hx   . Prostate cancer Neg Hx     Review of Systems Denies fever, chills. No cough or wheezing. GERD  symptoms are mild, he takes Prilosec daily and is essentially asymptomatic. No nausea, vomiting, diarrhea or blood in the stools.     Objective:   Physical Exam BP 112/70  Pulse 61  Temp(Src) 98 F (36.7 C) (Oral)  Ht 5' 8.5" (1.74 m)  Wt 163 lb (73.936 kg)  BMI 24.42 kg/m2  SpO2 94%  General -- alert, well-developed, no apparent distress.  Not pale or jaundice. Neck --no thyromegaly , normal carotid pulse.  Lungs -- normal respiratory effort, no intercostal retractions, no accessory muscle use, and normal breath sounds.   Heart-- normal rate, regular rhythm, no murmur, and no gallop.   Abdomen--soft, non-tender, no distention, no masses, no HSM, no guarding, and no rigidity.  No bruit Extremities-- no pretibial edema bilaterally Rectal-- No external abnormalities noted. Normal sphincter tone. No rectal masses or tenderness. No stools found Prostate:  Prostate gland firm and smooth, definitely enlarged, no nodularity or tenderness. Neurologic-- alert & oriented X3 and strength normal in all extremities. Psych-- Cognition and judgment appear intact. Alert and cooperative with normal attention  span and concentration.  not anxious appearing and not depressed appearing.      Assessment & Plan:

## 2012-10-01 ENCOUNTER — Other Ambulatory Visit: Payer: Self-pay | Admitting: Internal Medicine

## 2012-10-03 NOTE — Telephone Encounter (Signed)
Refill done.  

## 2012-10-04 ENCOUNTER — Ambulatory Visit: Payer: 59 | Admitting: Cardiovascular Disease

## 2012-10-12 ENCOUNTER — Encounter: Payer: Self-pay | Admitting: Cardiovascular Disease

## 2012-10-12 ENCOUNTER — Encounter: Payer: Self-pay | Admitting: *Deleted

## 2012-10-13 ENCOUNTER — Encounter: Payer: Self-pay | Admitting: Cardiovascular Disease

## 2012-10-13 ENCOUNTER — Ambulatory Visit (INDEPENDENT_AMBULATORY_CARE_PROVIDER_SITE_OTHER): Payer: Medicare Other | Admitting: Cardiovascular Disease

## 2012-10-13 VITALS — BP 148/86 | HR 61 | Wt 164.0 lb

## 2012-10-13 DIAGNOSIS — I10 Essential (primary) hypertension: Secondary | ICD-10-CM | POA: Diagnosis not present

## 2012-10-13 DIAGNOSIS — R079 Chest pain, unspecified: Secondary | ICD-10-CM | POA: Diagnosis not present

## 2012-10-13 NOTE — Assessment & Plan Note (Signed)
Well controlled.  Continue current medications and low sodium Dash type diet.    

## 2012-10-13 NOTE — Patient Instructions (Signed)

## 2012-10-13 NOTE — Assessment & Plan Note (Signed)
Atypical normal exam chronic RBBB  F/U ETT

## 2012-10-13 NOTE — Progress Notes (Signed)
Patient ID: Cory Zavala, male   DOB: March 25, 1931, 79 y.o.   MRN: [id number] 79 yo with HTN referred by Dr Drue Novel for chest pain  Pain is atypical only a few episodes last few weeks Has some gi overtones with burnign ? Indigestion He walks over 3 miles daily with no pain. No history of heart disease. 2 months ago felt lightheaded sitting in a wheel chair visiting a NH. Actually felt normal standing up. No other focal neuro signs Had normal stress test 20 years ago or so.  No palpitations, dyspnea, arthritis . No recent change in meds. Denies excess ETOH  ROS: Denies fever, malais, weight loss, blurry vision, decreased visual acuity, cough, sputum, SOB, hemoptysis, pleuritic pain, palpitaitons, heartburn, abdominal pain, melena, lower extremity edema, claudication, or rash.  All other systems reviewed and negative   General: Affect appropriate Healthy:  appears stated age HEENT: normal Neck supple with no adenopathy JVP normal no bruits no thyromegaly Lungs clear with no wheezing and good diaphragmatic motion Heart:  S1/S2 no murmur,rub, gallop or click PMI normal Abdomen: benighn, BS positve, no tenderness, no AAA no bruit.  No HSM or HJR Distal pulses intact with no bruits No edema Neuro non-focal Skin warm and dry No muscular weakness  Medications Current Outpatient Prescriptions  Medication Sig Dispense Refill  . aspirin 81 MG tablet Take 81 mg by mouth daily.      . Multiple Vitamin (MULTIVITAMIN) tablet Take 1 tablet by mouth daily.      . nitroGLYCERIN (NITROSTAT) 0.4 MG SL tablet Place 1 tablet (0.4 mg total) under the tongue every 5 (five) minutes as needed for chest pain.  30 tablet  1  . omeprazole (PRILOSEC OTC) 20 MG tablet Take 20 mg by mouth daily.      Marland Kitchen triamterene-hydrochlorothiazide (MAXZIDE-25) 37.5-25 MG per tablet TAKE 1 TABLET BY MOUTH EVERY DAY  90 tablet  1   No current facility-administered medications for this visit.    Allergies Review of patient's allergies  indicates no known allergies.  Family History: Family History  Problem Relation Age of Onset  . CAD Neg Hx   . Diabetes Neg Hx   . Colon cancer Neg Hx   . Prostate cancer Neg Hx     Social History: History   Social History  . Marital Status: Married    Spouse Name: N/A    Number of Children: 2  . Years of Education: N/A   Occupational History  . pastor, semi retired     Social History Main Topics  . Smoking status: Never Smoker   . Smokeless tobacco: Never Used  . Alcohol Use: No  . Drug Use: No  . Sexually Active: Not on file   Other Topics Concern  . Not on file   Social History Narrative   Retired, patient was a Optician, dispensing still preachs, Musician   Married > 50 years, lost wife aprox 5-11. remarried          Electrocardiogram:  4/3  SR chronic RBBB otherwise normal  Assessment and Plan

## 2012-10-27 ENCOUNTER — Encounter: Payer: 59 | Admitting: Physician Assistant

## 2012-11-04 ENCOUNTER — Ambulatory Visit (INDEPENDENT_AMBULATORY_CARE_PROVIDER_SITE_OTHER): Payer: Medicare Other | Admitting: Physician Assistant

## 2012-11-04 DIAGNOSIS — R079 Chest pain, unspecified: Secondary | ICD-10-CM

## 2012-11-04 NOTE — Progress Notes (Signed)
Exercise Treadmill Test  Pre-Exercise Testing Evaluation Rhythm: normal sinus  Rate: 67     Test  Exercise Tolerance Test Ordering MD: Charlton Haws, MD  Interpreting MD: Tereso Newcomer, PA-C  Unique Test No: 1  Treadmill:  1  Indication for ETT: chest pain - rule out ischemia  Contraindication to ETT: No   Stress Modality: exercise - treadmill  Cardiac Imaging Performed: non   Protocol: standard Bruce - maximal  Max BP:  197/114  Max MPHR (bpm):  140 85% MPR (bpm):  119  MPHR obtained (bpm):  142 % MPHR obtained:  101  Reached 85% MPHR (min:sec):  3:19 Total Exercise Time (min-sec):  6:00  Workload in METS:  7.0 Borg Scale: 11  Reason ETT Terminated:  exaggerated hypertensive response    ST Segment Analysis At Rest: normal ST segments - no evidence of significant ST depression With Exercise: non-specific ST changes  Other Information Arrhythmia:  Yes Angina during ETT:  absent (0) Quality of ETT:  diagnostic  ETT Interpretation:  normal - no evidence of ischemia by ST analysis  Comments: Good exercise tolerance. No chest pain. Hypertensive BP response to exercise. Baseline RBBB.  No significant ST-T changes from baseline to suggest ischemia (up-sloping ST depression).  Patient had several short bursts of SVT (3-4 beats) during exercise.  No symptoms.  No sustained arrhythmias.   Recommendations: F/u with Dr. Charlton Haws as directed. Signed,  Tereso Newcomer, PA-C   11/04/2012 10:51 AM

## 2012-11-16 ENCOUNTER — Encounter: Payer: Self-pay | Admitting: Internal Medicine

## 2012-11-16 ENCOUNTER — Ambulatory Visit (INDEPENDENT_AMBULATORY_CARE_PROVIDER_SITE_OTHER): Payer: Medicare Other | Admitting: Internal Medicine

## 2012-11-16 VITALS — BP 124/80 | HR 56 | Temp 98.4°F | Wt 164.0 lb

## 2012-11-16 DIAGNOSIS — I10 Essential (primary) hypertension: Secondary | ICD-10-CM

## 2012-11-16 DIAGNOSIS — R079 Chest pain, unspecified: Secondary | ICD-10-CM | POA: Diagnosis not present

## 2012-11-16 NOTE — Patient Instructions (Addendum)
Stretching 3 times a day Use ice at night Call if the heel pain is not better --- Check the  blood pressure 2 or 3 times a week, be sure it is between 110/60 and 140/85. If it is consistently higher or lower, let me know --- Next visit by October 2014 -- Plantar Fasciitis Plantar fasciitis is a common condition that causes foot pain. It is soreness (inflammation) of the band of tough fibrous tissue on the bottom of the foot that runs from the heel bone (calcaneus) to the ball of the foot. The cause of this soreness may be from excessive standing, poor fitting shoes, running on hard surfaces, being overweight, having an abnormal walk, or overuse (this is common in runners) of the painful foot or feet. It is also common in aerobic exercise dancers and ballet dancers. SYMPTOMS  Most people with plantar fasciitis complain of:  Severe pain in the morning on the bottom of their foot especially when taking the first steps out of bed. This pain recedes after a few minutes of walking.  Severe pain is experienced also during walking following a long period of inactivity.  Pain is worse when walking barefoot or up stairs DIAGNOSIS   Your caregiver will diagnose this condition by examining and feeling your foot.  Special tests such as X-rays of your foot, are usually not needed. PREVENTION   Consult a sports medicine professional before beginning a new exercise program.  Walking programs offer a good workout. With walking there is a lower chance of overuse injuries common to runners. There is less impact and less jarring of the joints.  Begin all new exercise programs slowly. If problems or pain develop, decrease the amount of time or distance until you are at a comfortable level.  Wear good shoes and replace them regularly.  Stretch your foot and the heel cords at the back of the ankle (Achilles tendon) both before and after exercise.  Run or exercise on even surfaces that are not hard. For  example, asphalt is better than pavement.  Do not run barefoot on hard surfaces.  If using a treadmill, vary the incline.  Do not continue to workout if you have foot or joint problems. Seek professional help if they do not improve. HOME CARE INSTRUCTIONS   Avoid activities that cause you pain until you recover.  Use ice or cold packs on the problem or painful areas after working out.  Only take over-the-counter or prescription medicines for pain, discomfort, or fever as directed by your caregiver.  Soft shoe inserts or athletic shoes with air or gel sole cushions may be helpful.  If problems continue or become more severe, consult a sports medicine caregiver or your own health care provider. Cortisone is a potent anti-inflammatory medication that may be injected into the painful area. You can discuss this treatment with your caregiver. MAKE SURE YOU:   Understand these instructions.  Will watch your condition.  Will get help right away if you are not doing well or get worse. Document Released: 02/24/2001 Document Revised: 08/24/2011 Document Reviewed: 04/25/2008 [facility] Patient Information 2014 [place], [place].

## 2012-11-16 NOTE — Progress Notes (Signed)
  Subjective:    Patient ID: Cory Zavala, male    DOB: 1930-07-01, 79 y.o.   MRN: [id number]  HPI Acute visit Several weeks history of pain in the left heel, worse after he walked on the treadmill. The pain is sharp and triggered by putting pressure on the heel, walking .   Past Medical History  Diagnosis Date  . HTN (hypertension)   . BPH (benign prostatic hyperplasia)   . Back pain, chronic   . GERD (gastroesophageal reflux disease) 09/15/2012   Past Surgical History  Procedure Laterality Date  . Cataract extraction Bilateral 2010  . Vertebroplasty  2001    History   Social History  . Marital Status: Married    Spouse Name: N/A    Number of Children: 2  . Years of Education: N/A   Occupational History  . pastor, semi retired     Social History Main Topics  . Smoking status: Never Smoker   . Smokeless tobacco: Never Used  . Alcohol Use: No  . Drug Use: No  . Sexually Active: Not on file   Other Topics Concern  . Not on file   Social History Narrative   Retired, patient was a Optician, dispensing still preachs, Musician   Married > 50 years, lost wife aprox 5-11. remarried           Review of Systems Denies any heel injury or swelling. Has a callus at the second right toe that cause some discomfort from time to time. Was seen recently with chest pain, no further symptoms. Ambulatory BPs reportedly normal    Objective:   Physical Exam General -- alert, well-developed, NAD.   Marland Kitchen   Extremities--  no pretibial edema bilaterally Normal pedal pulses bilaterally. Left heel tender to palpation at the plantar aspect. Medial aspect of the second right toe has a callus with no redness swelling or tenderness. Psych-- Cognition and judgment appear intact. Alert and cooperative with normal attention span and concentration.  not anxious appearing and not depressed appearing.       Assessment & Plan:   Heel pain, Likely plantar fasciitis, discuss stretching 3 times a day and  icing the area at night. He  has already has a shoe  insole, continue using it.  Let me know if he is not improving gradually. Callous at 2th R toe, use a OTC cushion, podiatry if no better

## 2012-11-16 NOTE — Assessment & Plan Note (Addendum)
Since the last time he was here, had a stress test ----> was negative except for elevated BP and a short burst of SVT .(d/w cards, short burst of SVT --> no further eval needed) He is now asymptomatic, ambulatory BPs well-controlled. ----- Treadmill test 11/04/2012. Good exercise tolerance.  No chest pain.  Hypertensive BP response to exercise.  Baseline RBBB. No significant ST-T changes from baseline to suggest ischemia (up-sloping ST depression).  Patient had several short bursts of SVT (3-4 beats) during exercise. No symptoms. No sustained arrhythmias.

## 2012-11-16 NOTE — Assessment & Plan Note (Signed)
Well-controlled in the ambulatory setting, continue checking ambulatory BPs.  BP was noted to be elevated during the stress test.

## 2012-12-22 DIAGNOSIS — H26499 Other secondary cataract, unspecified eye: Secondary | ICD-10-CM | POA: Diagnosis not present

## 2012-12-22 DIAGNOSIS — H35369 Drusen (degenerative) of macula, unspecified eye: Secondary | ICD-10-CM | POA: Diagnosis not present

## 2012-12-22 DIAGNOSIS — H11159 Pinguecula, unspecified eye: Secondary | ICD-10-CM | POA: Diagnosis not present

## 2012-12-22 DIAGNOSIS — H43819 Vitreous degeneration, unspecified eye: Secondary | ICD-10-CM | POA: Diagnosis not present

## 2013-03-03 DIAGNOSIS — R350 Frequency of micturition: Secondary | ICD-10-CM | POA: Diagnosis not present

## 2013-03-13 DIAGNOSIS — N3941 Urge incontinence: Secondary | ICD-10-CM | POA: Diagnosis not present

## 2013-03-13 DIAGNOSIS — R350 Frequency of micturition: Secondary | ICD-10-CM | POA: Diagnosis not present

## 2013-03-13 DIAGNOSIS — N318 Other neuromuscular dysfunction of bladder: Secondary | ICD-10-CM | POA: Diagnosis not present

## 2013-03-20 ENCOUNTER — Encounter: Payer: Self-pay | Admitting: Internal Medicine

## 2013-03-20 ENCOUNTER — Ambulatory Visit (INDEPENDENT_AMBULATORY_CARE_PROVIDER_SITE_OTHER): Payer: Medicare Other | Admitting: Internal Medicine

## 2013-03-20 VITALS — BP 158/78 | HR 80 | Temp 98.5°F | Wt 164.0 lb

## 2013-03-20 DIAGNOSIS — Z13820 Encounter for screening for osteoporosis: Secondary | ICD-10-CM | POA: Diagnosis not present

## 2013-03-20 DIAGNOSIS — N400 Enlarged prostate without lower urinary tract symptoms: Secondary | ICD-10-CM | POA: Diagnosis not present

## 2013-03-20 DIAGNOSIS — I10 Essential (primary) hypertension: Secondary | ICD-10-CM

## 2013-03-20 DIAGNOSIS — Z0000 Encounter for general adult medical examination without abnormal findings: Secondary | ICD-10-CM

## 2013-03-20 DIAGNOSIS — Z23 Encounter for immunization: Secondary | ICD-10-CM | POA: Diagnosis not present

## 2013-03-20 NOTE — Progress Notes (Signed)
  Subjective:    Patient ID: Cory Zavala, male    DOB: [birth year], 79 y.o.   MRN: [id number]  HPI Routine office visit History of positive Hemoccult, here to discuss GI referral History of vertebroplasty remotely, no recent bone density test History of chest pain, symptoms resolved Since the last time he was here he had nocturia, taking 2 new medications, see medication list, f/u by urology  Past Medical History  Diagnosis Date  . HTN (hypertension)   . BPH (benign prostatic hyperplasia)   . Back pain, chronic   . GERD (gastroesophageal reflux disease) 09/15/2012   Past Surgical History  Procedure Laterality Date  . Cataract extraction Bilateral 2010  . Vertebroplasty  2001   History   Social History  . Marital Status: Married    Spouse Name: N/A    Number of Children: 2  . Years of Education: N/A   Occupational History  . pastor, semi retired     Social History Main Topics  . Smoking status: Never Smoker   . Smokeless tobacco: Never Used  . Alcohol Use: No  . Drug Use: No  . Sexual Activity: Not on file   Other Topics Concern  . Not on file   Social History Narrative   Retired, patient was a Optician, dispensing still preachs, Musician   Married > 50 years, lost wife aprox 5-11. remarried           Review of Systems Very active, feeling well. Nausea, vomiting, diarrhea or blood in the stools. No abdominal pain.    Objective:   Physical Exam  BP 158/78  Pulse 80  Temp(Src) 98.5 F (36.9 C)  Wt 164 lb (74.39 kg)  BMI 24.57 kg/m2  SpO2 98% General -- alert, well-developed, NAD.   Lungs -- normal respiratory effort, no intercostal retractions, no accessory muscle use, and normal breath sounds.  Heart-- normal rate, regular rhythm, no murmur.   Psych-- Cognition and judgment appear intact. Cooperative with normal attention span and concentration. No anxious appearing , no depressed appearing.        Assessment & Plan:

## 2013-03-20 NOTE — Assessment & Plan Note (Addendum)
Recently saw urology due to nocturia. Doing a trial w/ Myberteq and rapaflo

## 2013-03-20 NOTE — Assessment & Plan Note (Signed)
Systolic BP slightly elevated today but usually okay, no change

## 2013-03-20 NOTE — Assessment & Plan Note (Signed)
History of + iFOB, quite reluctant to be refer to GI, explained that that test could be an early sign of cancer. Patient likes to repeat the iFOB, a new kit  was provided  History of vertebroplasty, testosterone in the last year normal, check a bone density test

## 2013-04-03 ENCOUNTER — Other Ambulatory Visit (INDEPENDENT_AMBULATORY_CARE_PROVIDER_SITE_OTHER): Payer: Medicare Other

## 2013-04-03 DIAGNOSIS — Z0000 Encounter for general adult medical examination without abnormal findings: Secondary | ICD-10-CM | POA: Diagnosis not present

## 2013-04-04 DIAGNOSIS — N401 Enlarged prostate with lower urinary tract symptoms: Secondary | ICD-10-CM | POA: Diagnosis not present

## 2013-04-04 DIAGNOSIS — N3941 Urge incontinence: Secondary | ICD-10-CM | POA: Diagnosis not present

## 2013-04-04 DIAGNOSIS — N138 Other obstructive and reflux uropathy: Secondary | ICD-10-CM | POA: Diagnosis not present

## 2013-04-05 ENCOUNTER — Ambulatory Visit (INDEPENDENT_AMBULATORY_CARE_PROVIDER_SITE_OTHER)
Admission: RE | Admit: 2013-04-05 | Discharge: 2013-04-05 | Disposition: A | Discharge status: Home / Self Care | Payer: Medicare Other | Source: Ambulatory Visit | Attending: Internal Medicine | Admitting: Internal Medicine

## 2013-04-05 ENCOUNTER — Other Ambulatory Visit: Payer: Self-pay | Admitting: Internal Medicine

## 2013-04-05 DIAGNOSIS — Z8781 Personal history of (healed) traumatic fracture: Secondary | ICD-10-CM | POA: Diagnosis not present

## 2013-04-05 DIAGNOSIS — Z13820 Encounter for screening for osteoporosis: Secondary | ICD-10-CM | POA: Diagnosis not present

## 2013-04-28 DIAGNOSIS — N138 Other obstructive and reflux uropathy: Secondary | ICD-10-CM | POA: Diagnosis not present

## 2013-04-28 DIAGNOSIS — N139 Obstructive and reflux uropathy, unspecified: Secondary | ICD-10-CM | POA: Diagnosis not present

## 2013-04-28 DIAGNOSIS — N3941 Urge incontinence: Secondary | ICD-10-CM | POA: Diagnosis not present

## 2013-04-28 DIAGNOSIS — N401 Enlarged prostate with lower urinary tract symptoms: Secondary | ICD-10-CM | POA: Diagnosis not present

## 2013-05-12 ENCOUNTER — Telehealth: Payer: Self-pay | Admitting: Internal Medicine

## 2013-05-12 NOTE — Telephone Encounter (Signed)
Bone density test with T score -3.3. Abnormality at L3-4 (X-rays last year shows severe DJD in that area).  Advise patient,  has severe osteoporosis, recommend to start medication. There is many alternatives, please arrange a office visit to discuss. Will also rx a repeated x-ray of his back when he comes back.

## 2013-05-12 NOTE — Telephone Encounter (Signed)
lmovm

## 2013-05-15 NOTE — Telephone Encounter (Signed)
Pt notified. DJR  

## 2013-05-15 NOTE — Telephone Encounter (Signed)
Patient calling back about results. Please follow-up.

## 2013-05-29 ENCOUNTER — Ambulatory Visit: Payer: Medicare Other | Admitting: Internal Medicine

## 2013-06-02 ENCOUNTER — Ambulatory Visit (INDEPENDENT_AMBULATORY_CARE_PROVIDER_SITE_OTHER): Payer: Medicare Other | Admitting: Internal Medicine

## 2013-06-02 ENCOUNTER — Encounter: Payer: Self-pay | Admitting: Internal Medicine

## 2013-06-02 VITALS — BP 144/83 | HR 65 | Temp 98.0°F | Wt 164.0 lb

## 2013-06-02 DIAGNOSIS — R109 Unspecified abdominal pain: Secondary | ICD-10-CM | POA: Diagnosis not present

## 2013-06-02 DIAGNOSIS — I10 Essential (primary) hypertension: Secondary | ICD-10-CM

## 2013-06-02 DIAGNOSIS — M810 Age-related osteoporosis without current pathological fracture: Secondary | ICD-10-CM

## 2013-06-02 MED ORDER — LOSARTAN POTASSIUM 50 MG PO TABS: 50.0000 mg | ORAL_TABLET | Freq: Every day | ORAL | Status: DC

## 2013-06-02 NOTE — Assessment & Plan Note (Signed)
Bone density test November 2014 --->  T score -3.3 Has a history of  vertebroplasty. The diagnosis of osteoporosis was discussed, risks, treatment options (from oral to sq to  IV discussed) Patient declined all medication except calcium and vitamin D. we'll reassess periodically

## 2013-06-02 NOTE — Assessment & Plan Note (Signed)
Blood pressure is well-controlled but he strongly request to discontinue Maxzide, he is certain may affect his osteoporosis, I'm not convinced.  we will start losartan, BMP in 2 week

## 2013-06-02 NOTE — Patient Instructions (Signed)
Stop Maxzide, dhe diuretic Start losartan 50 mg one tablet daily Arrange for a lab to be done in  2 or 3 weeks from today: BMP--- hypertension  Check the  blood pressure 2 or 3 times a   week be sure it is between 110/60 and 140/85. Ideal blood pressure is 120/80. If it is consistently higher or lower, let me know  Take calcium 1 g and vitamin D 3000 units every day Stay active

## 2013-06-02 NOTE — Progress Notes (Signed)
Pre visit review using our clinic review tool, if applicable. No additional management support is needed unless otherwise documented below in the visit note. 

## 2013-06-02 NOTE — Progress Notes (Signed)
   Subjective:    Patient ID: Cory Zavala, male    DOB: 1931-01-04, 79 y.o.   MRN: [id number]  HPI We discussed the following Osteoporosis, see assessment and plan One week ago developed abdominal pain, left-sided, sharp like a knife, now is much better. Worse with torso movement?Marland Kitchen No lump Hypertension--likes to stop the diuretic, he feels that that may be affecting his osteoporosis. Definitely likes to try something different.  Past Medical History  Diagnosis Date  . HTN (hypertension)   . BPH (benign prostatic hyperplasia)   . Back pain, chronic   . GERD (gastroesophageal reflux disease) 09/15/2012   Past Surgical History  Procedure Laterality Date  . Cataract extraction Bilateral 2010  . Vertebroplasty  2001    Review of Systems Denies nausea, vomiting, diarrhea or blood in the stools. No fever or chills. No rash in the abdomen.     Objective:   Physical Exam  Abdominal:     BP 144/83  Pulse 65  Temp(Src) 98 F (36.7 C)  Wt 164 lb (74.39 kg)  SpO2 97% General -- alert, well-developed, NAD.    Abdomen-- Not distended, good bowel sounds,soft, non-tender. No rebound or rigidity. No mass,organomegaly. No abd wall hernia Psych-- Cognition and judgment appear intact. Cooperative with normal attention span and concentration. No anxious appearing , no depressed appearing.       Assessment & Plan:   Abdominal pain, abdominal wall pain? Plan: Observation

## 2013-06-19 ENCOUNTER — Other Ambulatory Visit (INDEPENDENT_AMBULATORY_CARE_PROVIDER_SITE_OTHER): Payer: Medicare Other

## 2013-06-19 DIAGNOSIS — I10 Essential (primary) hypertension: Secondary | ICD-10-CM

## 2013-06-19 LAB — BASIC METABOLIC PANEL
BUN: 16 mg/dL (ref 6–23)
CO2: 31 meq/L (ref 19–32)
Calcium: 9 mg/dL (ref 8.4–10.5)
Chloride: 106 mEq/L (ref 96–112)
Creatinine, Ser: 1.1 mg/dL (ref 0.4–1.5)
GFR: 65.53 mL/min (ref >60.00)
Glucose, Bld: 103 mg/dL — ABNORMAL HIGH (ref 70–99)
POTASSIUM: 4 meq/L (ref 3.5–5.1)
SODIUM: 141 meq/L (ref 135–145)

## 2013-06-21 ENCOUNTER — Encounter: Payer: Self-pay | Admitting: *Deleted

## 2013-06-30 ENCOUNTER — Other Ambulatory Visit: Payer: Self-pay | Admitting: Internal Medicine

## 2013-06-30 NOTE — Telephone Encounter (Signed)
Losartan refilled per protocol. JG//CMA 

## 2013-08-28 DIAGNOSIS — H1130 Conjunctival hemorrhage, unspecified eye: Secondary | ICD-10-CM | POA: Diagnosis not present

## 2013-08-28 DIAGNOSIS — H26499 Other secondary cataract, unspecified eye: Secondary | ICD-10-CM | POA: Diagnosis not present

## 2013-08-28 DIAGNOSIS — H43819 Vitreous degeneration, unspecified eye: Secondary | ICD-10-CM | POA: Diagnosis not present

## 2013-11-30 DIAGNOSIS — R310 Gross hematuria: Secondary | ICD-10-CM | POA: Diagnosis not present

## 2013-11-30 DIAGNOSIS — R972 Elevated prostate specific antigen [PSA]: Secondary | ICD-10-CM | POA: Diagnosis not present

## 2013-11-30 DIAGNOSIS — N390 Urinary tract infection, site not specified: Secondary | ICD-10-CM | POA: Diagnosis not present

## 2013-12-01 DIAGNOSIS — C680 Malignant neoplasm of urethra: Secondary | ICD-10-CM | POA: Diagnosis not present

## 2013-12-01 DIAGNOSIS — R310 Gross hematuria: Secondary | ICD-10-CM | POA: Diagnosis not present

## 2013-12-12 ENCOUNTER — Other Ambulatory Visit: Payer: Self-pay | Admitting: Urology

## 2013-12-12 DIAGNOSIS — R310 Gross hematuria: Secondary | ICD-10-CM | POA: Diagnosis not present

## 2013-12-12 DIAGNOSIS — N138 Other obstructive and reflux uropathy: Secondary | ICD-10-CM | POA: Diagnosis not present

## 2013-12-12 DIAGNOSIS — Q619 Cystic kidney disease, unspecified: Secondary | ICD-10-CM | POA: Diagnosis not present

## 2013-12-12 DIAGNOSIS — N401 Enlarged prostate with lower urinary tract symptoms: Secondary | ICD-10-CM | POA: Diagnosis not present

## 2013-12-12 DIAGNOSIS — D4959 Neoplasm of unspecified behavior of other genitourinary organ: Secondary | ICD-10-CM | POA: Diagnosis not present

## 2013-12-19 ENCOUNTER — Encounter (HOSPITAL_BASED_OUTPATIENT_CLINIC_OR_DEPARTMENT_OTHER): Payer: Self-pay | Admitting: *Deleted

## 2013-12-20 ENCOUNTER — Encounter (HOSPITAL_BASED_OUTPATIENT_CLINIC_OR_DEPARTMENT_OTHER): Payer: Self-pay | Admitting: *Deleted

## 2013-12-20 NOTE — Progress Notes (Signed)
NPO AFTER MN. ARRIVE AT 0900. NEEDS ISTAT AND EKG. WILL TAKE COZAAR AND PRILOSEC AM DOS W/ SIPS OF WATER.

## 2013-12-25 ENCOUNTER — Other Ambulatory Visit: Payer: Self-pay

## 2013-12-25 ENCOUNTER — Encounter (HOSPITAL_BASED_OUTPATIENT_CLINIC_OR_DEPARTMENT_OTHER): Payer: Medicare Other | Admitting: Anesthesiology

## 2013-12-25 ENCOUNTER — Encounter (HOSPITAL_BASED_OUTPATIENT_CLINIC_OR_DEPARTMENT_OTHER)
Admission: RE | Disposition: A | Discharge status: Home / Self Care | Payer: Self-pay | Source: Ambulatory Visit | Attending: Urology

## 2013-12-25 ENCOUNTER — Ambulatory Visit (HOSPITAL_BASED_OUTPATIENT_CLINIC_OR_DEPARTMENT_OTHER)
Admission: RE | Admit: 2013-12-25 | Discharge: 2013-12-25 | Disposition: A | Discharge status: Home / Self Care | Payer: Medicare Other | Source: Ambulatory Visit | Attending: Urology | Admitting: Urology

## 2013-12-25 ENCOUNTER — Ambulatory Visit (HOSPITAL_BASED_OUTPATIENT_CLINIC_OR_DEPARTMENT_OTHER): Payer: Medicare Other | Admitting: Anesthesiology

## 2013-12-25 ENCOUNTER — Encounter (HOSPITAL_BASED_OUTPATIENT_CLINIC_OR_DEPARTMENT_OTHER): Payer: Self-pay | Admitting: *Deleted

## 2013-12-25 DIAGNOSIS — R319 Hematuria, unspecified: Secondary | ICD-10-CM | POA: Diagnosis not present

## 2013-12-25 DIAGNOSIS — N138 Other obstructive and reflux uropathy: Secondary | ICD-10-CM | POA: Diagnosis not present

## 2013-12-25 DIAGNOSIS — N401 Enlarged prostate with lower urinary tract symptoms: Secondary | ICD-10-CM | POA: Diagnosis not present

## 2013-12-25 DIAGNOSIS — D303 Benign neoplasm of bladder: Secondary | ICD-10-CM | POA: Diagnosis not present

## 2013-12-25 DIAGNOSIS — N139 Obstructive and reflux uropathy, unspecified: Secondary | ICD-10-CM | POA: Diagnosis not present

## 2013-12-25 DIAGNOSIS — Z79899 Other long term (current) drug therapy: Secondary | ICD-10-CM | POA: Insufficient documentation

## 2013-12-25 DIAGNOSIS — R310 Gross hematuria: Secondary | ICD-10-CM | POA: Diagnosis not present

## 2013-12-25 DIAGNOSIS — N320 Bladder-neck obstruction: Secondary | ICD-10-CM | POA: Insufficient documentation

## 2013-12-25 DIAGNOSIS — N329 Bladder disorder, unspecified: Secondary | ICD-10-CM | POA: Diagnosis not present

## 2013-12-25 DIAGNOSIS — N318 Other neuromuscular dysfunction of bladder: Secondary | ICD-10-CM | POA: Diagnosis not present

## 2013-12-25 DIAGNOSIS — K219 Gastro-esophageal reflux disease without esophagitis: Secondary | ICD-10-CM | POA: Diagnosis not present

## 2013-12-25 DIAGNOSIS — I10 Essential (primary) hypertension: Secondary | ICD-10-CM | POA: Insufficient documentation

## 2013-12-25 DIAGNOSIS — N529 Male erectile dysfunction, unspecified: Secondary | ICD-10-CM | POA: Diagnosis not present

## 2013-12-25 DIAGNOSIS — D4100 Neoplasm of uncertain behavior of unspecified kidney: Secondary | ICD-10-CM | POA: Diagnosis not present

## 2013-12-25 DIAGNOSIS — Z7982 Long term (current) use of aspirin: Secondary | ICD-10-CM | POA: Insufficient documentation

## 2013-12-25 HISTORY — DX: Age-related osteoporosis without current pathological fracture: M81.0

## 2013-12-25 HISTORY — DX: Nocturia: R35.1

## 2013-12-25 HISTORY — DX: Unspecified right bundle-branch block: I45.10

## 2013-12-25 HISTORY — DX: Benign prostatic hyperplasia without lower urinary tract symptoms: N40.0

## 2013-12-25 HISTORY — DX: Diverticulosis of large intestine without perforation or abscess without bleeding: K57.30

## 2013-12-25 HISTORY — DX: Reserved for inherently not codable concepts without codable children: IMO0001

## 2013-12-25 HISTORY — DX: Male erectile dysfunction, unspecified: N52.9

## 2013-12-25 HISTORY — PX: CYSTOSCOPY WITH RETROGRADE PYELOGRAM, URETEROSCOPY AND STENT PLACEMENT: SHX5789

## 2013-12-25 LAB — POCT I-STAT 4, (NA,K, GLUC, HGB,HCT)
Glucose, Bld: 108 mg/dL — ABNORMAL HIGH (ref 70–99)
HCT: 42 % (ref 39.0–52.0)
Hemoglobin: 14.3 g/dL (ref 13.0–17.0)
Potassium: 3.9 mEq/L (ref 3.7–5.3)
Sodium: 142 mEq/L (ref 137–147)

## 2013-12-25 SURGERY — CYSTOURETEROSCOPY, WITH RETROGRADE PYELOGRAM AND STENT INSERTION
Anesthesia: General | Site: Ureter | Laterality: Right

## 2013-12-25 MED ORDER — LIDOCAINE HCL (CARDIAC) 20 MG/ML IV SOLN
INTRAVENOUS | Status: DC | PRN
  Administered 2013-12-25: 70 mg via INTRAVENOUS

## 2013-12-25 MED ORDER — PHENAZOPYRIDINE HCL 200 MG PO TABS
200.0000 mg | ORAL_TABLET | Freq: Once | ORAL | Status: AC
  Administered 2013-12-25: 200 mg via ORAL
  Filled 2013-12-25: qty 1

## 2013-12-25 MED ORDER — TAMSULOSIN HCL 0.4 MG PO CAPS
0.4000 mg | ORAL_CAPSULE | Freq: Once | ORAL | Status: AC
  Administered 2013-12-25: 0.4 mg via ORAL
  Filled 2013-12-25: qty 1

## 2013-12-25 MED ORDER — PHENAZOPYRIDINE HCL 200 MG PO TABS: 200.0000 mg | ORAL_TABLET | Freq: Three times a day (TID) | ORAL | Status: DC | PRN

## 2013-12-25 MED ORDER — BELLADONNA ALKALOIDS-OPIUM 16.2-60 MG RE SUPP
RECTAL | Status: AC
  Filled 2013-12-25: qty 1

## 2013-12-25 MED ORDER — ONDANSETRON HCL 4 MG/2ML IJ SOLN
INTRAMUSCULAR | Status: DC | PRN
  Administered 2013-12-25: 4 mg via INTRAVENOUS

## 2013-12-25 MED ORDER — FENTANYL CITRATE 0.05 MG/ML IJ SOLN
INTRAMUSCULAR | Status: AC
  Filled 2013-12-25: qty 2

## 2013-12-25 MED ORDER — LACTATED RINGERS IV SOLN
INTRAVENOUS | Status: DC
  Administered 2013-12-25: 09:00:00 via INTRAVENOUS
  Filled 2013-12-25: qty 1000

## 2013-12-25 MED ORDER — FENTANYL CITRATE 0.05 MG/ML IJ SOLN
INTRAMUSCULAR | Status: AC
  Filled 2013-12-25: qty 4

## 2013-12-25 MED ORDER — MEPERIDINE HCL 25 MG/ML IJ SOLN
6.2500 mg | INTRAMUSCULAR | Status: DC | PRN
  Filled 2013-12-25: qty 1

## 2013-12-25 MED ORDER — PHENAZOPYRIDINE HCL 100 MG PO TABS
ORAL_TABLET | ORAL | Status: AC
  Filled 2013-12-25: qty 2

## 2013-12-25 MED ORDER — ACETAMINOPHEN 10 MG/ML IV SOLN
INTRAVENOUS | Status: DC | PRN
  Administered 2013-12-25: 1000 mg via INTRAVENOUS

## 2013-12-25 MED ORDER — PROMETHAZINE HCL 25 MG/ML IJ SOLN
6.2500 mg | INTRAMUSCULAR | Status: DC | PRN
  Filled 2013-12-25: qty 1

## 2013-12-25 MED ORDER — PROPOFOL 10 MG/ML IV BOLUS
INTRAVENOUS | Status: DC | PRN
  Administered 2013-12-25: 150 mg via INTRAVENOUS

## 2013-12-25 MED ORDER — HYDROCODONE-ACETAMINOPHEN 5-325 MG PO TABS
1.0000 | ORAL_TABLET | Freq: Four times a day (QID) | ORAL | Status: AC | PRN
  Administered 2013-12-25: 1 via ORAL
  Filled 2013-12-25: qty 1

## 2013-12-25 MED ORDER — BELLADONNA ALKALOIDS-OPIUM 16.2-60 MG RE SUPP
1.0000 | Freq: Every day | RECTAL | Status: DC
  Administered 2013-12-25: 1 via RECTAL
  Filled 2013-12-25: qty 1

## 2013-12-25 MED ORDER — IOHEXOL 350 MG/ML SOLN
INTRAVENOUS | Status: DC | PRN
  Administered 2013-12-25: 10 mL

## 2013-12-25 MED ORDER — CIPROFLOXACIN IN D5W 200 MG/100ML IV SOLN
200.0000 mg | INTRAVENOUS | Status: AC
  Administered 2013-12-25: 200 mg via INTRAVENOUS
  Filled 2013-12-25: qty 100

## 2013-12-25 MED ORDER — FENTANYL CITRATE 0.05 MG/ML IJ SOLN
INTRAMUSCULAR | Status: DC | PRN
  Administered 2013-12-25: 50 ug via INTRAVENOUS
  Administered 2013-12-25: 25 ug via INTRAVENOUS
  Administered 2013-12-25 (×2): 12.5 ug via INTRAVENOUS

## 2013-12-25 MED ORDER — DEXAMETHASONE SODIUM PHOSPHATE 4 MG/ML IJ SOLN
INTRAMUSCULAR | Status: DC | PRN
  Administered 2013-12-25: 10 mg via INTRAVENOUS

## 2013-12-25 MED ORDER — HYDROCODONE-ACETAMINOPHEN 5-325 MG PO TABS
ORAL_TABLET | ORAL | Status: AC
  Filled 2013-12-25: qty 1

## 2013-12-25 MED ORDER — FENTANYL CITRATE 0.05 MG/ML IJ SOLN
25.0000 ug | INTRAMUSCULAR | Status: DC | PRN
  Administered 2013-12-25 (×3): 25 ug via INTRAVENOUS
  Filled 2013-12-25: qty 1

## 2013-12-25 MED ORDER — SODIUM CHLORIDE 0.9 % IR SOLN
Status: DC | PRN
  Administered 2013-12-25: 4000 mL

## 2013-12-25 MED ORDER — HYDROCODONE-ACETAMINOPHEN 10-325 MG PO TABS: 1.0000 | ORAL_TABLET | ORAL | Status: DC | PRN

## 2013-12-25 SURGICAL SUPPLY — 39 items
ADAPTER CATH URET PLST 4-6FR (CATHETERS) ×2 IMPLANT
BAG DRAIN URO-CYSTO SKYTR STRL (DRAIN) ×2 IMPLANT
BASKET LASER NITINOL 1.9FR (BASKET) IMPLANT
BASKET STNLS GEMINI 4WIRE 3FR (BASKET) IMPLANT
BASKET ZERO TIP NITINOL 2.4FR (BASKET) IMPLANT
CANISTER SUCT LVC 12 LTR MEDI- (MISCELLANEOUS) ×2 IMPLANT
CATH CLEAR GEL 3F BACKSTOP (CATHETERS) IMPLANT
CATH INTERMIT  6FR 70CM (CATHETERS) IMPLANT
CATH URET 5FR 28IN CONE TIP (BALLOONS)
CATH URET 5FR 70CM CONE TIP (BALLOONS) IMPLANT
CLOTH BEACON ORANGE TIMEOUT ST (SAFETY) ×2 IMPLANT
DRAPE CAMERA CLOSED 9X96 (DRAPES) ×2 IMPLANT
ELECT REM PT RETURN 9FT ADLT (ELECTROSURGICAL)
ELECTRODE REM PT RTRN 9FT ADLT (ELECTROSURGICAL) IMPLANT
FIBER LASER FLEXIVA 200 (UROLOGICAL SUPPLIES) IMPLANT
FIBER LASER FLEXIVA 365 (UROLOGICAL SUPPLIES) IMPLANT
FIBER LASER FLEXIVA 550 (UROLOGICAL SUPPLIES) IMPLANT
GLOVE BIO SURGEON STRL SZ8 (GLOVE) ×2 IMPLANT
GLOVE SURG SS PI 7.5 STRL IVOR (GLOVE) ×4 IMPLANT
GOWN PREVENTION PLUS LG XLONG (DISPOSABLE) IMPLANT
GOWN STRL REIN XL XLG (GOWN DISPOSABLE) IMPLANT
GOWN STRL REUS W/ TWL XL LVL3 (GOWN DISPOSABLE) ×1 IMPLANT
GOWN STRL REUS W/TWL XL LVL3 (GOWN DISPOSABLE) ×3 IMPLANT
GUIDEWIRE 0.038 PTFE COATED (WIRE) IMPLANT
GUIDEWIRE ANG ZIPWIRE 038X150 (WIRE) IMPLANT
GUIDEWIRE STR DUAL SENSOR (WIRE) ×2 IMPLANT
IV NS 1000ML (IV SOLUTION) ×2
IV NS 1000ML BAXH (IV SOLUTION) ×2 IMPLANT
IV NS IRRIG 3000ML ARTHROMATIC (IV SOLUTION) ×2 IMPLANT
KIT BALLIN UROMAX 15FX10 (LABEL) IMPLANT
KIT BALLN UROMAX 15FX4 (MISCELLANEOUS) IMPLANT
KIT BALLN UROMAX 26 75X4 (MISCELLANEOUS)
PACK CYSTOSCOPY (CUSTOM PROCEDURE TRAY) ×2 IMPLANT
SET HIGH PRES BAL DIL (LABEL)
SHEATH ACCESS URETERAL 38CM (SHEATH) IMPLANT
SHEATH ACCESS URETERAL 54CM (SHEATH) IMPLANT
STENT ×2 IMPLANT
STENT URET 6FRX24 CONTOUR (STENTS) ×2 IMPLANT
WATER STERILE IRR 3000ML UROMA (IV SOLUTION) IMPLANT

## 2013-12-25 NOTE — OR Nursing (Signed)
Dr. Karsten Ro made aware of patients discomfort and possible need for antispasmotic. Received order for B&0 Supp.

## 2013-12-25 NOTE — Anesthesia Procedure Notes (Signed)
Procedure Name: LMA Insertion Date/Time: 12/25/2013 10:29 AM Performed by: Mechele Claude Pre-anesthesia Checklist: Patient identified, Emergency Drugs available, Suction available and Patient being monitored Patient Re-evaluated:Patient Re-evaluated prior to inductionOxygen Delivery Method: Circle System Utilized Preoxygenation: Pre-oxygenation with 100% oxygen Intubation Type: IV induction Ventilation: Mask ventilation without difficulty LMA: LMA inserted LMA Size: 4.0 Number of attempts: 1 Airway Equipment and Method: bite block Placement Confirmation: positive ETCO2 Tube secured with: Tape Dental Injury: Teeth and Oropharynx as per pre-operative assessment

## 2013-12-25 NOTE — Op Note (Signed)
PATIENT:  Cory Zavala  PRE-OPERATIVE DIAGNOSIS: 1. Right renal pelvis mass 2. Right distal ureteral abnormality 3. Bladder lesion  POST-OPERATIVE DIAGNOSIS: 1. No evidence of right renal pelvis mass 2. No evidence of right distal ureteral urothelial abnormality 3. Bladder lesion  PROCEDURE: 1. Cystoscopy with right retrograde pyelogram including interpretation. 2. Right ureteroscopy.  3. Right distal ureteral brush biopsy 4. Right double-J stent placement 5. Bladder biopsy 6. Bladder barbotage for cytology  SURGEON:  Claybon Jabs  INDICATION: Cory Zavala is a 80 year old male who experienced gross hematuria and was also having significant voiding symptoms. 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 Cystoscopically I found an erythematous region in the bladder posterior and lateral to the right ureteral orifice. He is brought to the operating room today for further evaluation and attempted tissue diagnosis.  ANESTHESIA:  General  EBL:  Minimal  DRAINS: 6 French, 24 cm double-J stent in the right ureter (no string)  LOCAL MEDICATIONS USED:  None  SPECIMEN:  1. Brush biopsy of distal right ureter 2. Barbotaged specimen from the bladder for cytology 3. Cold cup biopsies of the right wall of the bladder.  Description of procedure: After informed consent the patient was taken to the operating room and placed on the table in a supine position. General anesthesia was then administered. Once fully anesthetized the patient was moved to the dorsal lithotomy position and the genitalia were sterilely prepped and draped in standard fashion. An official timeout was then performed.  Initially the 23 French cystoscope was  passed under direct vision down the urethra which was normal and through the prostate which revealed trilobar hypertrophy. I then entered the bladder and did a full, systematic inspection and noted one plus trabeculation. The left ureteral orifice was of normal configuration and position. The right ureteral orifice did not appear abnormal. There was mild erythema just posterior to the right ureteral orifice.  A 6 French open-ended ureteral catheter was then passed through the cystoscope and into the right ureteral orifice. I then performed a right retrograde pyelogram by injecting full-strength contrast through the open ended catheter under direct fluoroscopic vision and noted some mild J. hooking of the distal right ureter but no luminal filling defects could be identified in the area of the distal ureter. The remainder of the ureter appeared normal and smooth walled. The collecting system then filled out and before collecting system and renal pelvis filled with contrast and revealed no abnormality whatsoever of the urothelium or evidence of filling defect /Mass effect.  A 0.038 inch floppy-tipped guidewire was then passed under direct fluoroscopic guidance up the right ureter and left in place. I then dilated the ureterovesical junction initially with the inner portion of a ureteral access sheath. I then passed the ureteral access sheath over the guidewire into the area of the proximal ureter and remove the guidewire as well as inner portion of the access sheath and then proceeded with ureteroscopy. A flexible, digital ureteroscope was passed through the access sheath and into the area the renal pelvis. I then did a systematic survey of each of the calyces and infundibulum and found absolutely no abnormality whatsoever of the urothelium. The renal pelvis was fully inspected and also was noted be entirely normal with  no papillary lesions, erythematous areas or other obvious abnormalities. I was able to  visualize all portions of the collecting system and found absolutely no abnormality so I advanced the scope back down the ureter under direct vision first noting no abnormality in the area the ureteropelvic junction and no abnormality of the ureter as the scope was backed all the way on down the ureter to the region of the ureterovesical junction. At this location there appeared to be some mild erythema most likely secondary to the effects of passing the access sheath but I found no papillary lesions in this location. I did perform a brush biopsy of the distal/intramural ureter and this was sent to pathology. I then passed a guidewire into the area of the renal pelvis through the ureteroscope under fluoroscopy. I removed the ureteroscope and left the guidewire in place.  I backloaded the cystoscope over the guidewire and passed the double-J stent over the guidewire into the area the renal pelvis. I then removed the guidewire noting good curl of the stent in the renal pelvis and in the bladder.  I then used saline to fill the bladder and the Toomey syringe to barbotaged the bladder thoroughly and sent this for cytology. I then passed the flexible biopsy forceps through the cystoscope and obtain cold cup biopsies of the area posterior to the ureteral orifice where I did note some mild erythema of the bladder wall and also a biopsy of the bladder distal to the ureteral orifice as well. Both of these locations were fulgurated with the [place]bee. There was no active bleeding at the end of the procedure. I therefore drained the bladder, removed the cystoscope and the patient was awakened and taken to the recovery room in stable and satisfactory condition. He tolerated procedure well no intraoperative complications.    PLAN OF CARE: Discharge to home after PACU  PATIENT DISPOSITION:  PACU - hemodynamically stable.

## 2013-12-25 NOTE — Anesthesia Preprocedure Evaluation (Addendum)
Anesthesia Evaluation  Patient identified by MRN, date of birth, ID band Patient awake    Reviewed: Allergy & Precautions, H&P , NPO status , Patient's Chart, lab work & pertinent test results  Airway Mallampati: II TM Distance: >3 FB Neck ROM: Full    Dental no notable dental hx.    Pulmonary neg pulmonary ROS,  breath sounds clear to auscultation  Pulmonary exam normal       Cardiovascular hypertension, Pt. on medications Rhythm:Regular Rate:Normal  RBBB   Neuro/Psych negative neurological ROS  negative psych ROS   GI/Hepatic Neg liver ROS, GERD-  Controlled,  Endo/Other  negative endocrine ROS  Renal/GU negative Renal ROS  negative genitourinary   Musculoskeletal negative musculoskeletal ROS (+)   Abdominal   Peds negative pediatric ROS (+)  Hematology negative hematology ROS (+)   Anesthesia Other Findings   Reproductive/Obstetrics negative OB ROS                          Anesthesia Physical Anesthesia Plan  ASA: II  Anesthesia Plan: General   Post-op Pain Management:    Induction: Intravenous  Airway Management Planned: LMA  Additional Equipment:   Intra-op Plan:   Post-operative Plan: Extubation in OR  Informed Consent: I have reviewed the patients History and Physical, chart, labs and discussed the procedure including the risks, benefits and alternatives for the proposed anesthesia with the patient or authorized representative who has indicated his/her understanding and acceptance.   Dental advisory given  Plan Discussed with: CRNA  Anesthesia Plan Comments:         Anesthesia Quick Evaluation

## 2013-12-25 NOTE — Anesthesia Postprocedure Evaluation (Signed)
  Anesthesia Post-op Note  Patient: Cory Zavala  Procedure(s) Performed: Procedure(s) (LRB): CYSTOSCOPY WITH RIGHT RETROGRADE PYELOGRAM, RIGHT URETEROSCOPY  BIOPSY AND STENT BLADDER BIOPSY (Right)  Patient Location: PACU  Anesthesia Type: General  Level of Consciousness: awake and alert   Airway and Oxygen Therapy: Patient Spontanous Breathing  Post-op Pain: mild  Post-op Assessment: Post-op Vital signs reviewed, Patient's Cardiovascular Status Stable, Respiratory Function Stable, Patent Airway and No signs of Nausea or vomiting  Last Vitals:  Filed Vitals:   12/25/13 1200  BP: 159/86  Pulse: 68  Temp:   Resp: 18    Post-op Vital Signs: stable   Complications: No apparent anesthesia complications

## 2013-12-25 NOTE — Discharge Instructions (Signed)
Post Bladder Surgery Instructions   General instructions:     Your recent bladder surgery requires very little post hospital care but some definite precautions.  Despite the fact that no skin incisions were used, the area around the bladder incisions are raw and covered with scabs to promote healing and prevent bleeding. Certain precautions are needed to insure that the scabs are not disturbed over the next 2-4 weeks while the healing proceeds.  Because the raw surface inside your bladder and the irritating effects of urine you may expect frequency of urination and/or urgency (a stronger desire to urinate) and perhaps even getting up at night more often. This will usually resolve or improve slowly over the healing period. You may see some blood in your urine over the first 6 weeks. Do not be alarmed, even if the urine was clear for a while. Get off your feet and drink lots of fluids until clearing occurs. If you start to pass clots or don't improve call us.  Catheter: (If you are discharged with a catheter.)  1. Keep your catheter secured to your leg at all times with tape or the supplied strap. 2. You may experience leakage of urine around your catheter- as long as the  catheter continues to drain, this is normal.  If your catheter stops draining  go to the ER. 3. You may also have blood in your urine, even after it has been clear for  several days; you may even pass some small blood clots or other material.  This  is normal as well.  If this happens, sit down and drink plenty of water to help  make urine to flush out your bladder.  If the blood in your urine becomes worse  after doing this, contact our office or return to the ER. 4. You may use the leg bag (small bag) during the day, but use the large bag at  night.  Diet:  You may return to your normal diet immediately. Because of the raw surface of your bladder, alcohol, spicy foods, foods high in acid and drinks with caffeine may  cause irritation or frequency and should be used in moderation. To keep your urine flowing freely and avoid constipation, drink plenty of fluids during the day (8-10 glasses). Tip: Avoid cranberry juice because it is very acidic.  Activity:  Your physical activity doesn't need to be restricted. However, if you are very active, you may see some blood in the urine. We suggest that you reduce your activity under the circumstances until the bleeding has stopped.  Bowels:  It is important to keep your bowels regular during the postoperative period. Straining with bowel movements can cause bleeding. A bowel movement every other day is reasonable. Use a mild laxative if needed, such as milk of magnesia 2-3 tablespoons, or 2 Dulcolax tablets. Call if you continue to have problems. If you had been taking narcotics for pain, before, during or after your surgery, you may be constipated. Take a laxative if necessary.    Medication:  You should resume your pre-surgery medications unless told not to. In addition you may be given an antibiotic to prevent or treat infection. Antibiotics are not always necessary. All medication should be taken as prescribed until the bottles are finished unless you are having an unusual reaction to one of the drugs.  Post stent placement instructions   Definitions:  Ureter: The duct that transports urine from the kidney to the bladder. Stent: A plastic hollow tube that  is placed into the ureter, from the kidney to the bladder to prevent the ureter from swelling shut.  General instructions:  Despite the fact that no skin incisions were used, the area around the ureter and bladder is raw and irritated. The stent is a foreign body which can further irritate the bladder wall. This irritation is manifested by increased frequency of urination, both day and night, and by an increase in the urge to urinate. In some, the urge to urinate is present almost always. Sometimes the urge  is strong enough that you may not be able to stop your self from urinating. This can often be controlled with medication but does not occur in everyone. A stent can safely be left in place for 3 months or greater.  You may see some blood in your urine while the stent is in place and a few days afterward. Do not be alarmed, even if the urine is clear for a while. Get off your feet and drink lots of fluids until clearing occurs. If you start to pass clots or don't improve, call us.  Diet:  You may return to your normal diet immediately. Because of the raw surface of your bladder, alcohol, spicy foods, foods high in acid and drinks with caffeine may cause irritation or frequency and should be used in moderation. To keep your urine flowing freely and avoid constipation, drink plenty of fluids during the day (8-10 glasses). Tip: Avoid cranberry juice because it is very acidic.  Activity:  Your physical activity doesn't need to be restricted. However, if you are very active, you may see some blood in the urine. We suggest that you reduce your activity under the circumstances until the bleeding has stopped.  Bowels:  It is important to keep your bowels regular during the postoperative period. Straining with bowel movements can cause bleeding. A bowel movement every other day is reasonable. Use a mild laxative if needed, such as milk of magnesia 2-3 tablespoons, or 2 Dulcolax tablets. Call if you continue to have problems. If you had been taking narcotics for pain, before, during or after your surgery, you may be constipated. Take a laxative if necessary.  Medication:  You should resume your pre-surgery medications unless told not to. In addition you may be given an antibiotic to prevent or treat infection. Antibiotics are not always necessary. All medication should be taken as prescribed until the bottles are finished unless you are having an unusual reaction to one of the drugs.  Problems you should  report to [place]:  a. Fever greater than 101F. b. Heavy bleeding, or clots (see notes above about blood in urine). c. Inability to urinate. d. Drug reactions (hives, rash, nausea, vomiting, diarrhea). e. Severe burning or pain with urination that is not improving.   Post Anesthesia Home Care Instructions  Activity: Get plenty of rest for the remainder of the day. A responsible adult should stay with you for 24 hours following the procedure.  For the next 24 hours, DO NOT: -Drive a car -Paediatric nurse -Drink alcoholic beverages -Take any medication unless instructed by your physician -Make any legal decisions or sign important papers.  Meals: Start with liquid foods such as gelatin or soup. Progress to regular foods as tolerated. Avoid greasy, spicy, heavy foods. If nausea and/or vomiting occur, drink only clear liquids until the nausea and/or vomiting subsides. Call your physician if vomiting continues.  Special Instructions/Symptoms: Your throat may feel dry or sore from the anesthesia or the breathing  tube placed in your throat during surgery. If this causes discomfort, gargle with warm salt water. The discomfort should disappear within 24 hours.

## 2013-12-25 NOTE — Transfer of Care (Signed)
Immediate Anesthesia Transfer of Care Note  Patient: Cory Zavala  Procedure(s) Performed: Procedure(s) (LRB): CYSTOSCOPY WITH RIGHT RETROGRADE PYELOGRAM, RIGHT URETEROSCOPY  BIOPSY AND STENT BLADDER BIOPSY (Right)  Patient Location: PACU  Anesthesia Type: General  Level of Consciousness: awake, alert  and oriented  Airway & Oxygen Therapy: Patient Spontanous Breathing and Patient connected to face mask oxygen  Post-op Assessment: Report given to PACU RN and Post -op Vital signs reviewed and stable  Post vital signs: Reviewed and stable  Complications: No apparent anesthesia complications

## 2013-12-26 ENCOUNTER — Encounter (HOSPITAL_BASED_OUTPATIENT_CLINIC_OR_DEPARTMENT_OTHER): Payer: Self-pay | Admitting: Urology

## 2014-01-01 DIAGNOSIS — N390 Urinary tract infection, site not specified: Secondary | ICD-10-CM | POA: Diagnosis not present

## 2014-01-01 DIAGNOSIS — D4100 Neoplasm of uncertain behavior of unspecified kidney: Secondary | ICD-10-CM | POA: Diagnosis not present

## 2014-01-01 DIAGNOSIS — D4120 Neoplasm of uncertain behavior of unspecified ureter: Secondary | ICD-10-CM | POA: Diagnosis not present

## 2014-01-01 DIAGNOSIS — R9389 Abnormal findings on diagnostic imaging of other specified body structures: Secondary | ICD-10-CM | POA: Diagnosis not present

## 2014-01-02 ENCOUNTER — Other Ambulatory Visit: Payer: Self-pay | Admitting: Urology

## 2014-01-02 DIAGNOSIS — D49519 Neoplasm of unspecified behavior of unspecified kidney: Secondary | ICD-10-CM

## 2014-01-02 NOTE — H&P (Signed)
History of Present Illness Bladder outlet obstruction and some bladder overactivity. I placed him on Avodart and over time he noted pretty significant improvement in his voiding symptoms but is no longer taking his medication. He gets up once or twice at night but still occasionally will have to stop what he is on the road and still has some mild hesitancy. In addition he notes that he has developed some urgency and occasionally if he is unable to get to the bathroom in time he will have some urge incontinence. This is associated with some urinary frequency.     Erectile dysfunction: This has responded to Cialis in the past.          He has a history of a slightly elevated PSA but has a really large prostate I think could possibly account for that as his DRE has been benign.     PSAs:     12/07 - 2.67     2/09 - 4.10     10/09 - 2.12     4/10 - 1.93     12/10 - 2.72     1/12 - 4.03     Gross hematuria: He had reported experiencing gross hematuria for 2 years intermittently. He had also reported some mild dysuria and had experienced some right flank pain.     Interval history: He has been doing well since I seen him last with no further hematuria. He is off aspirin now. He reports that he still having fairly significant voiding symptoms consisting of nocturia, frequency and urgency. In the past he had been given samples of Cialis 5 mg which helped and he inquired about this again. He completed his course of antibiotics for his culture proven UTI yesterday.   Past Medical History Problems  1. History of Benign prostatic hypertrophy without lower urinary tract symptoms (600.00) 2. History of Elevated prostate specific antigen (PSA) (790.93) 3. History of hypertension (V12.59) 4. History of Pain, joint, shoulder (719.41) 5. History of Skin tag (701.9)  Surgical History Problems  1. History of Back Surgery 2. History of Cataract Surgery  Current Meds 1. Adult Aspirin Low  Strength 81 MG TBDP;  Therapy: (Recorded:23Feb2009) to Recorded 2. Cialis 5 MG Oral Tablet; Take 1 tablet daily;  Therapy: [id number] to (Evaluate:14Dec2014)  Requested for: [id number]; Last  Rx:14Nov2014 Ordered 3. EpiPen 2-Pak 0.3 MG/0.3ML DEVI;  Therapy: [id number] to Recorded 4. Losartan Potassium 50 MG Oral Tablet;  Therapy: [id number] to Recorded 5. Sulfamethoxazole-TMP DS 800-160 MG Oral Tablet; Take 1 tablet twice daily;  Therapy: 21Jun2015 to (Last Rx:21Jun2015)  Requested for: 21Jun2015 Ordered 6. Triamterene-HCTZ 37.5-25 MG Oral Tablet;  Therapy: 21Apr2014 to Recorded 7. Ultra Mega Gold Oral Tablet Extended Release;  Therapy: (Recorded:23Feb2009) to Recorded  Allergies Medication  1. No Known Drug Allergies Non-Medication  2. Bee sting  Family History Problems  1. Family history of Death In The Family Father   59 2. Family history of Death In The Family Mother   44 cancer 3. Family history of Family Health Status Number Of Children   x2 sons 25. No pertinent family history : Sibling  Social History Problems  1. Denied: History of Alcohol Use 2. Caffeine Use   daily 3. Marital History - Currently Married 4. Never A Smoker 5. Occupation:   retired 20. Denied: History of Tobacco Use    Review of Systems Genitourinary, constitutional, skin, eye, otolaryngeal, hematologic/lymphatic, cardiovascular, pulmonary, endocrine, musculoskeletal, gastrointestinal, neurological and psychiatric system(s) were reviewed and pertinent findings if  present are noted.  Genitourinary: urinary frequency, feelings of urinary urgency, dysuria, post-void dribbling and erectile dysfunction.  Musculoskeletal: joint pain.     Vitals Vital Signs  Height: 5 ft 10 in Weight: 162 lb  BMI Calculated: 23.24 BSA Calculated: 1.91 Blood Pressure: 138 / 83 Heart Rate: 92  Physical Exam Constitutional: Well nourished and well developed. No acute distress.  ENT: The ears and nose are  normal in appearance.  Neck: The appearance of the neck is normal and no neck mass is present.  Pulmonary: No respiratory distress and normal respiratory rhythm and effort.  Cardiovascular: Heart rate and rhythm are normal. No peripheral edema.  Abdomen: The abdomen is soft and nontender. No masses are palpated. No CVA tenderness. No hernias are palpable. No hepatosplenomegaly noted.  Genitourinary: Examination of the penis demonstrates no discharge, no masses, no lesions and a normal meatus. . The scrotum is without lesions. The right epididymis is palpably normal and non-tender. The left epididymis is palpably normal and non-tender. The right testis is non-tender and without masses. The left testis is non-tender and without masses. Rectal exam demonstrates normal sphincter tone, no tenderness and no masses. Estimated prostate size is 3+. The prostate has no nodularity and is not tender. The left seminal vesicle is nonpalpable. The right seminal vesicle is nonpalpable. The perineum is normal on inspection.  Lymphatics: The femoral and inguinal nodes are not enlarged or tender.  Skin: Normal skin turgor, no visible rash and no visible skin lesions.  Neuro/Psych: Mood and affect are appropriate.    Results/Data  Urine  COLOR YELLOW  APPEARANCE CLEAR  SPECIFIC GRAVITY 1.020  pH 6.0  GLUCOSE NEG mg/dL BILIRUBIN NEG  KETONE NEG mg/dL BLOOD MOD  PROTEIN NEG mg/dL UROBILINOGEN 0.2 mg/dL NITRITE NEG  LEUKOCYTE ESTERASE SMALL  SQUAMOUS EPITHELIAL/HPF RARE  WBC 21-50 WBC/hpf RBC 7-10 RBC/hpf BACTERIA FEW  CRYSTALS Uric Acid crystals noted  CASTS NONE SEEN  Other MUCUS NOTED   The following images/tracing/specimen were independently visualized:  CT scan as below.  The following clinical lab reports were reviewed:  UA: Red and white cells were noted today that he has been on antibiotics.  The following radiology reports were reviewed: CT scan. Selected Results  AU CT-HEMATURIA  PROTOCOL [id number] 12:00AM Kathie Rhodes  Test Name Result Flag Reference AU CT-HEMATURIA PROTOCOL (Report)   ** RADIOLOGY REPORT BY [facility] **   CLINICAL DATA: Gross hematuria microhematuria.  EXAM: CT ABDOMEN AND PELVIS WITHOUT AND WITH CONTRAST  TECHNIQUE: Multidetector CT imaging of the abdomen and pelvis was performed following the standard protocol before and following the bolus administration of intravenous contrast.  CONTRAST: 125 mL of Isovue 300.  COMPARISON: No priors.  FINDINGS: Lung Bases: Small hiatal hernia.  Abdomen/Pelvis: There is a large amount of soft tissue fullness and heterogeneous enhancement in the right renal hilum, which has an appearance that is most compatible with a large upper tract urothelial neoplasm. This is irregular in shape and difficult to measure, but is estimated to be approximately 5.9 x 3.6 cm. Additionally, at the right ureterovesicular junction and there is some soft tissue thickening and avid enhancement, which corresponds to an area of marked narrowing of the distal right ureter on postcontrast delayed images, highly concerning for additional involvement at the right UVJ. There is mild fullness of the right ureter and right renal collecting system related to low level obstruction at the level of the right UVJ. Multifocal low-attenuation nonenhancing lesions are noted in the kidneys  bilaterally, compatible with simple cysts, largest of which is in the upper pole of the right kidney measuring 4.0 cm and in the upper pole of the left kidney measuring 3.2 cm. No urinary tract calculi.  The appearance of the liver, gallbladder, pancreas, spleen and bilateral adrenal glands is unremarkable. Atherosclerosis throughout the abdominal and pelvic vasculature, without evidence of aneurysm. No significant volume of ascites. No pneumoperitoneum. No pathologic distention of small bowel. No definite lymphadenopathy  identified within the abdomen or pelvis. Numerous colonic diverticulae are noted, without surrounding inflammatory changes to suggest an acute diverticulitis at this time. Prostate gland appears enlarged measuring 5.6 x 5.1 cm.  Musculoskeletal: Bilateral pars defects at L5. 12 mm of anterolisthesis of L5 upon S1. Partial lumbarization of S1. Vertebra plana appearance at T12, with post vertebroplasty changes noted. There are no aggressive appearing lytic or blastic lesions noted in the visualized portions of the skeleton.  IMPRESSION: 1. Findings, as above, compatible with multicentric right-sided urothelial neoplasm both at the right ureterovesicular junction causing mild obstruction, as well as a large upper tract lesion filling the majority of the right renal pelvis. 2. Multiple simple cysts in the kidneys bilaterally. 3. Colonic diverticulosis without evidence to suggest acute diverticulitis at this time. 4. Small hiatal hernia. 5. Additional incidental findings, as above.   Electronically Signed  By: Vinnie Langton M.D.  On: 12/01/2013 15:44  PSA REFLEX TO FREE [id number] 10:22AM Kathie Rhodes SPECIMEN TYPE: BLOOD  Test Name Result Flag Reference PSA 4.36 ng/mL H <=4.00 RESULT REPEATED AND VERIFIED. TEST METHODOLOGY: ECLIA PSA (ELECTROCHEMILUMINESCENCE IMMUNOASSAY) PSA, FREE 0.96 ng/mL   PSA, %FREE 22 % L > 25 PROBABILITY OF PROSTATE CANCER   (FOR MEN WITH NON-SUSPICIOUS DRE RESULTS AND PSA BETWEEN 4 AND   10 NG/ML, BY PATIENT AGE)     % FREE PSA                          PATIENT AGE                          50 TO 59 YEARS  60 TO 69 YEARS  >70 YEARS    <=10%                  49.2%           57.5%          64.5%    11 - 18%               26.9%           33.9%          40.8%    19 - 25%               18.3%           23.9%          29.7%    >25%                    9.1%           12.2%          15.8%  BUN & CREATININE [id number] 10:10AM Kathie Rhodes SPECIMEN TYPE:  BLOOD  Test Name Result Flag Reference CREATININE 1.07 mg/dL  0.50-1.50 BUN 18 mg/dL  6-23 Est GFR, African American 75 mL/min   Est GFR, NonAfrican American 65 mL/min   THE ESTIMATED GFR IS A CALCULATION  VALID FOR ADULTS (>=50 YEARS OLD) THAT USES THE CKD-EPI ALGORITHM TO ADJUST FOR AGE AND SEX. IT IS   NOT TO BE USED FOR CHILDREN, PREGNANT WOMEN, HOSPITALIZED PATIENTS,    PATIENTS ON DIALYSIS, OR WITH RAPIDLY CHANGING KIDNEY FUNCTION. ACCORDING TO THE NKDEP, EGFR >89 IS NORMAL, 60-89 SHOWS MILD IMPAIRMENT, 30-59 SHOWS MODERATE IMPAIRMENT, 15-29 SHOWS SEVERE IMPAIRMENT AND <15 IS ESRD.     Procedure  Procedure: Cystoscopy   Indication: Hematuria.  Informed Consent: Risks, benefits, and potential adverse events were discussed and informed consent was obtained from the patient.  Prep: The patient was prepped with betadine.  Anesthesia:. Local anesthesia was administered intraurethrally with 2% lidocaine jelly.  Procedure Note:  Urethral meatus:. No abnormalities.  Anterior urethra: No abnormalities.  Prostatic urethra:. There was visual obstruction of the prostatic urethra. The lateral prostatic lobes were enlarged.  Bladder: Visulization was clear. The ureteral orifices were in the normal anatomic position bilaterally and had clear efflux of urine. Examination of the bladder demonstrated erythematous mucosa located at the base of the bladder . The patient tolerated the procedure well.  Complications: None.    Assessment Assessed  1. Gross hematuria (599.71) 2. 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 Bilateral renal cysts (753.10) 4. Elevated prostate specific antigen (PSA) (790.93) 5. UTI (lower urinary tract infection) (599.0) 6. Benign prostatic hyperplasia with urinary obstruction (600.01,599.69)  Elevated PSA - his PSA was found to be 4.36/22%. It is elevated however it remained fairly stable and has shown fairly significant variation over the  years. With a good free to total ratio at 80 years of age I have recommended continued observation only.    UTI - He was found at his last visit to have a MRSA UTI. It was sensitive to Septra DS and he was placed on this with resolution of his dysuria.    Bilateral renal cysts - These were noted on his CT scan. They appeared to be simple and of no clinical significance.    Right renal pelvis mass - this appears to be a urothelial carcinoma involving a large portion of his collecting system on the right hand side. We have discussed the need for a tissue diagnosis but at this point there does not appear to be any suspicious adenopathy or evidence of metastatic spread. I am however concerned by the look of the distal ureter on that side which appears to possibly be involved with urothelial malignancy as well. There is a lot of periureteral stranding I have discussed with him the procedure of retrograde pyelogram, ureteroscopy and biopsy of the lesion with possible stent placement. We went over the procedure in detail including its risks and complications, the alternatives, probability of success and the anticipated postoperative course. All of his questions and answered to his satisfaction and he has elected to proceed.     Bladder lesion - I noted an erythematous region in the bladder posterior and lateral to the right ureteral orifice. This would correspond to the area seen on his CT scan along the course of his right ureter. I think this is what is causing some of his irritative voiding symptoms. He hasn't been helped by daily 5 mg Cialis in the past so many given some samples that.   Plan Health Maintenance  1. 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 Follow-up Schedule Surgery Office  Follow-up  Status: Hold For -  Appointment   Requested for: [phone]    1. I am going to have him hold his  low-dose daily aspirin until after his procedure.  2. He'll be scheduled for cystoscopy, right retrograde pyelogram, ureteroscopy and biopsy of his right renal pelvic lesion as well as his bladder lesion.

## 2014-01-04 ENCOUNTER — Other Ambulatory Visit: Payer: Self-pay | Admitting: Internal Medicine

## 2014-01-16 ENCOUNTER — Ambulatory Visit (HOSPITAL_COMMUNITY)
Admission: RE | Admit: 2014-01-16 | Discharge: 2014-01-16 | Disposition: A | Discharge status: Home / Self Care | Payer: Medicare Other | Source: Ambulatory Visit | Attending: Urology | Admitting: Urology

## 2014-01-16 DIAGNOSIS — R310 Gross hematuria: Secondary | ICD-10-CM | POA: Insufficient documentation

## 2014-01-16 DIAGNOSIS — K449 Diaphragmatic hernia without obstruction or gangrene: Secondary | ICD-10-CM | POA: Insufficient documentation

## 2014-01-16 DIAGNOSIS — N1330 Unspecified hydronephrosis: Secondary | ICD-10-CM | POA: Insufficient documentation

## 2014-01-16 DIAGNOSIS — D49519 Neoplasm of unspecified behavior of unspecified kidney: Secondary | ICD-10-CM

## 2014-01-16 DIAGNOSIS — N281 Cyst of kidney, acquired: Secondary | ICD-10-CM | POA: Diagnosis not present

## 2014-01-16 DIAGNOSIS — N289 Disorder of kidney and ureter, unspecified: Secondary | ICD-10-CM | POA: Diagnosis not present

## 2014-01-16 MED ORDER — GADOBENATE DIMEGLUMINE 529 MG/ML IV SOLN
15.0000 mL | Freq: Once | INTRAVENOUS | Status: AC | PRN
  Administered 2014-01-16: 15 mL via INTRAVENOUS

## 2014-03-08 ENCOUNTER — Ambulatory Visit: Payer: Medicare Other

## 2014-03-09 ENCOUNTER — Ambulatory Visit (INDEPENDENT_AMBULATORY_CARE_PROVIDER_SITE_OTHER): Payer: Medicare Other

## 2014-03-09 DIAGNOSIS — Z23 Encounter for immunization: Secondary | ICD-10-CM | POA: Diagnosis not present

## 2014-03-30 DIAGNOSIS — N400 Enlarged prostate without lower urinary tract symptoms: Secondary | ICD-10-CM | POA: Diagnosis not present

## 2014-03-30 DIAGNOSIS — R934 Abnormal findings on diagnostic imaging of urinary organs: Secondary | ICD-10-CM | POA: Diagnosis not present

## 2014-03-30 DIAGNOSIS — R300 Dysuria: Secondary | ICD-10-CM | POA: Diagnosis not present

## 2014-03-31 IMAGING — CR DG LUMBAR SPINE 2-3V
3 series · 3 of 3 positions shown · non-contrast
Comparison: 08/11/2005

CLINICAL DATA: Low back pain.

LUMBAR SPINE - 2-3 VIEW

[view not recorded (1 of 3)]
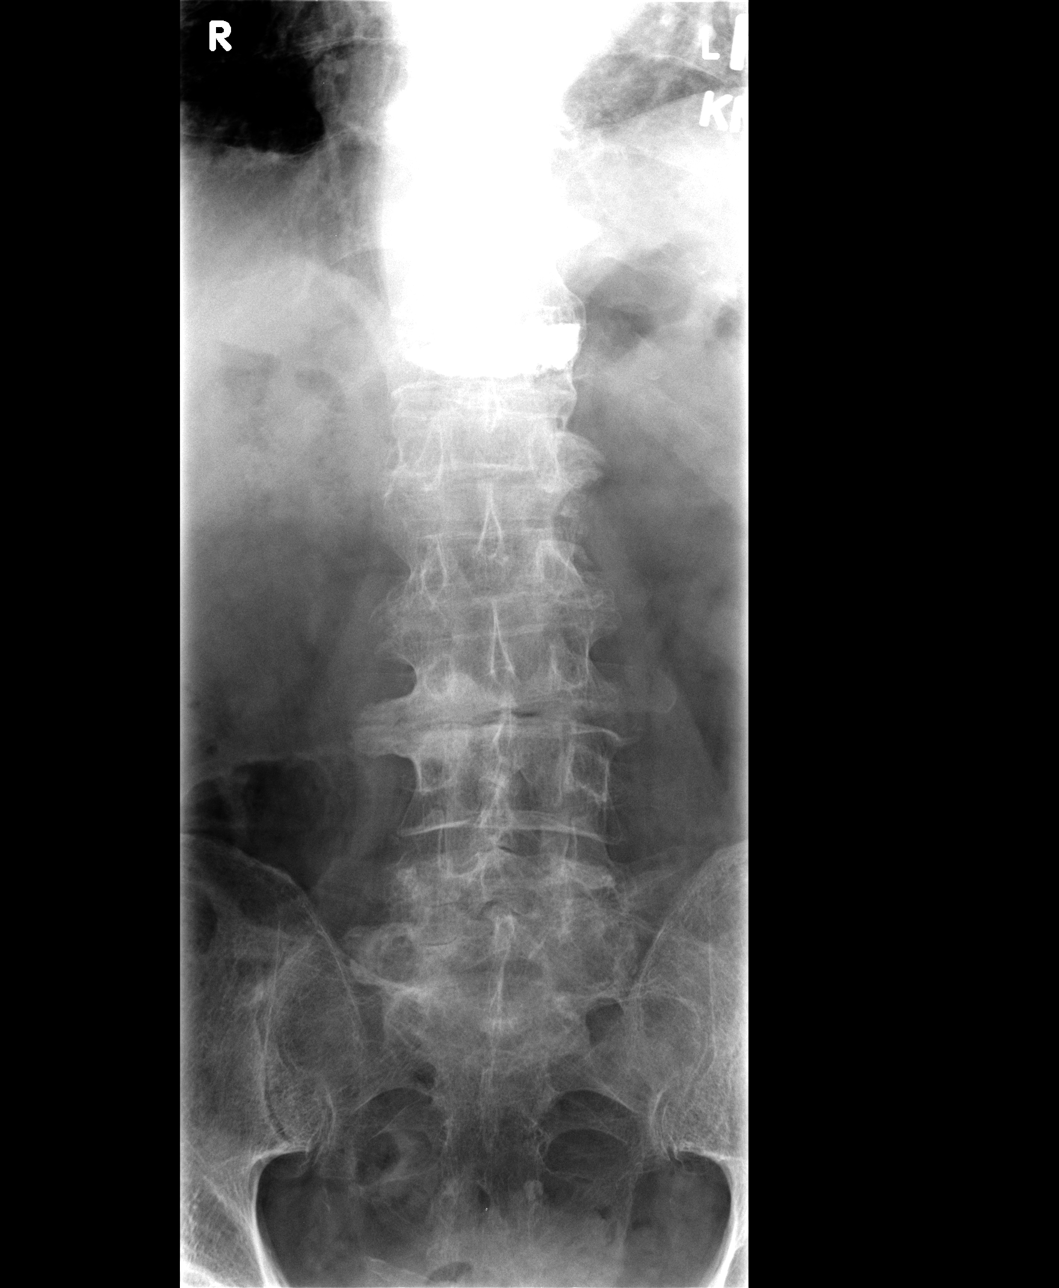

[view not recorded (2 of 3)]
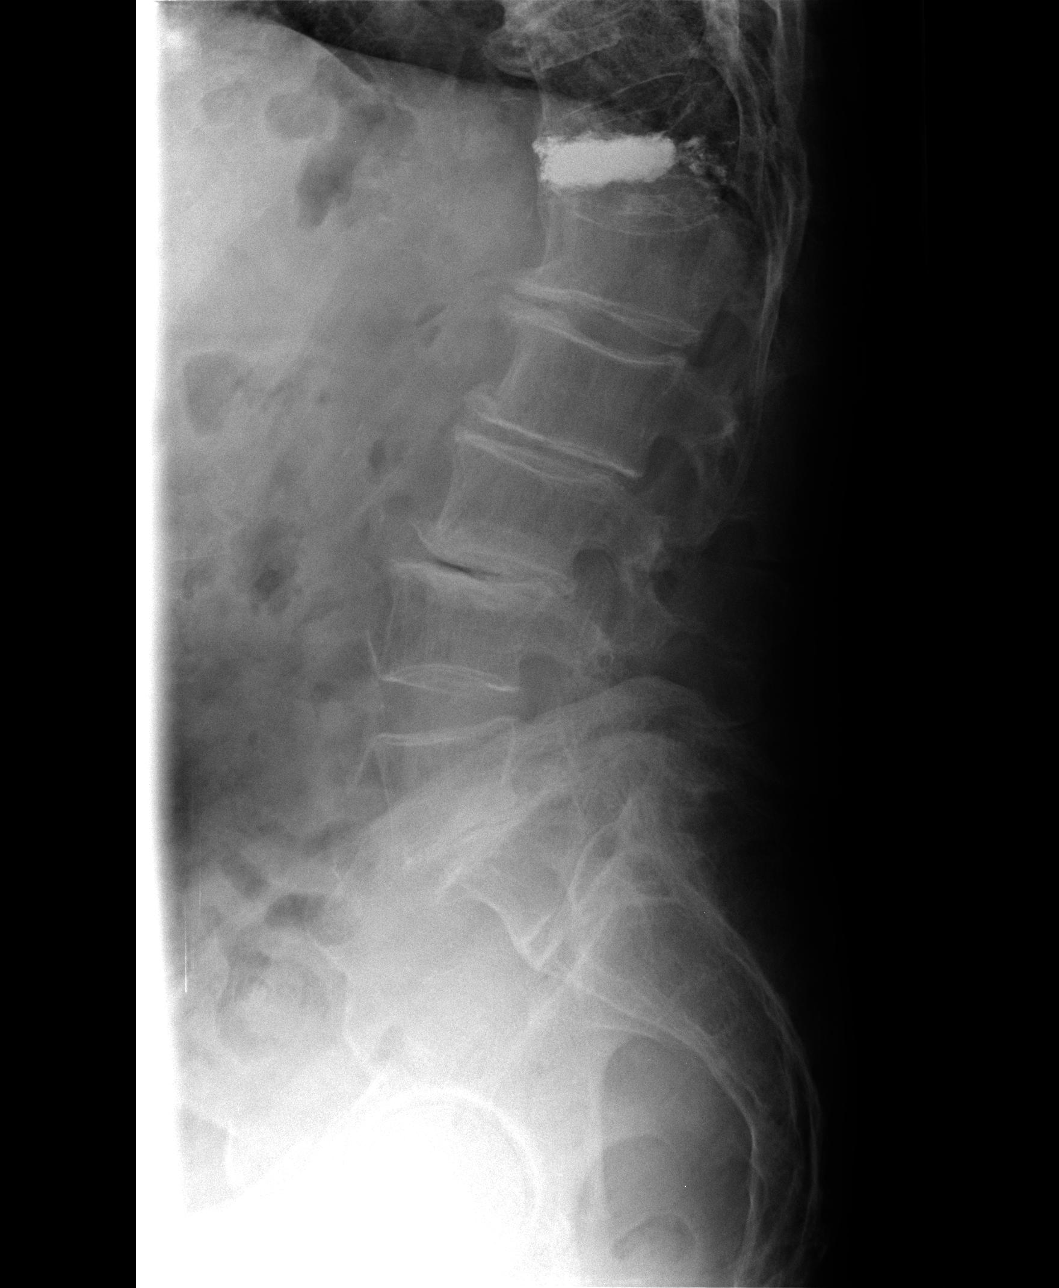

[view not recorded (3 of 3)]
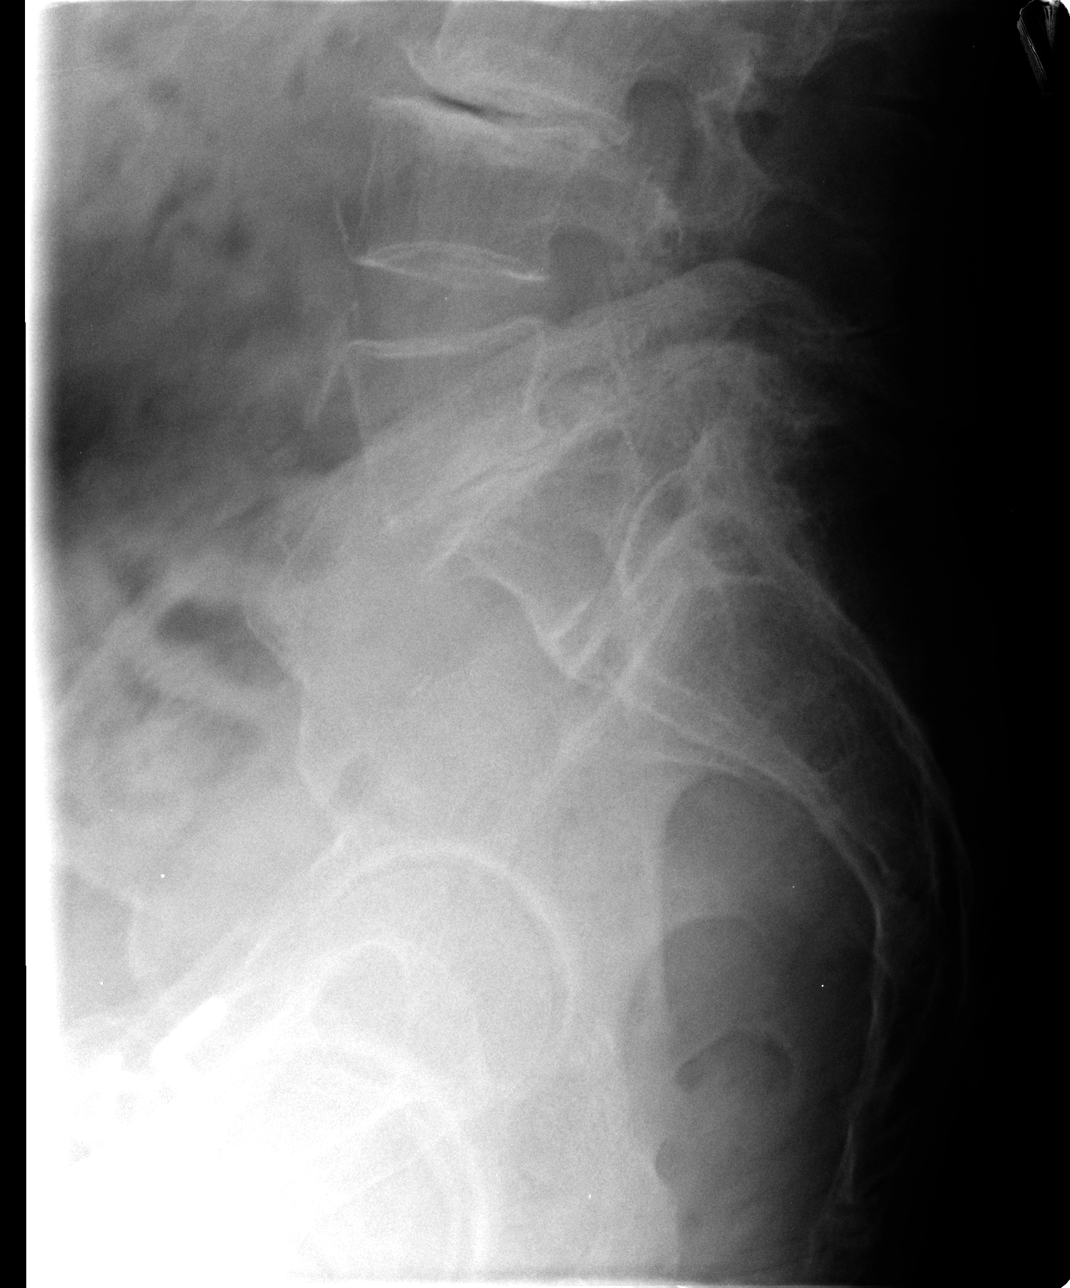

[3 of 3 positions shown; findings below may reference images not displayed]

FINDINGS: Old T12 vertebral body compression fracture with previous
vertebroplasty again noted.  No evidence of acute lumbar spine
fracture.

Severe degenerative disc disease and again seen at the L5-S1 with
grade 2 anterolisthesis measuring approximately 15 mm.  Progression
of severe degenerative disc disease seen at L3-4, with mild
retrolisthesis measuring approximately 7 mm.  Moderate severe
degenerative disc disease at L1-2 and L2-3 show no significant
change.  There is mild retrolisthesis at L2-3 measuring
approximately 7 mm.
IMPRESSION: 1.  Mild progression of severe degenerative disc disease at L3-4,
with 7 mm retrolisthesis.
2. Stable severe L5-S1 degenerative disc disease with 15 mm
anterolisthesis.  Stable L2-3 degenerative disc disease with 7 mm
retrolisthesis.
3.  Old T12 vertebral body compression fracture with previous
vertebroplasty.

## 2014-03-31 IMAGING — CR DG THORACIC SPINE 2V
3 series · 3 of 3 positions shown · non-contrast
Comparison: Chest radiographs 03/24/2010.]

CLINICAL DATA: 81-year-old male with back pain.

THORACIC SPINE - 2 VIEW

[view not recorded (1 of 3)]
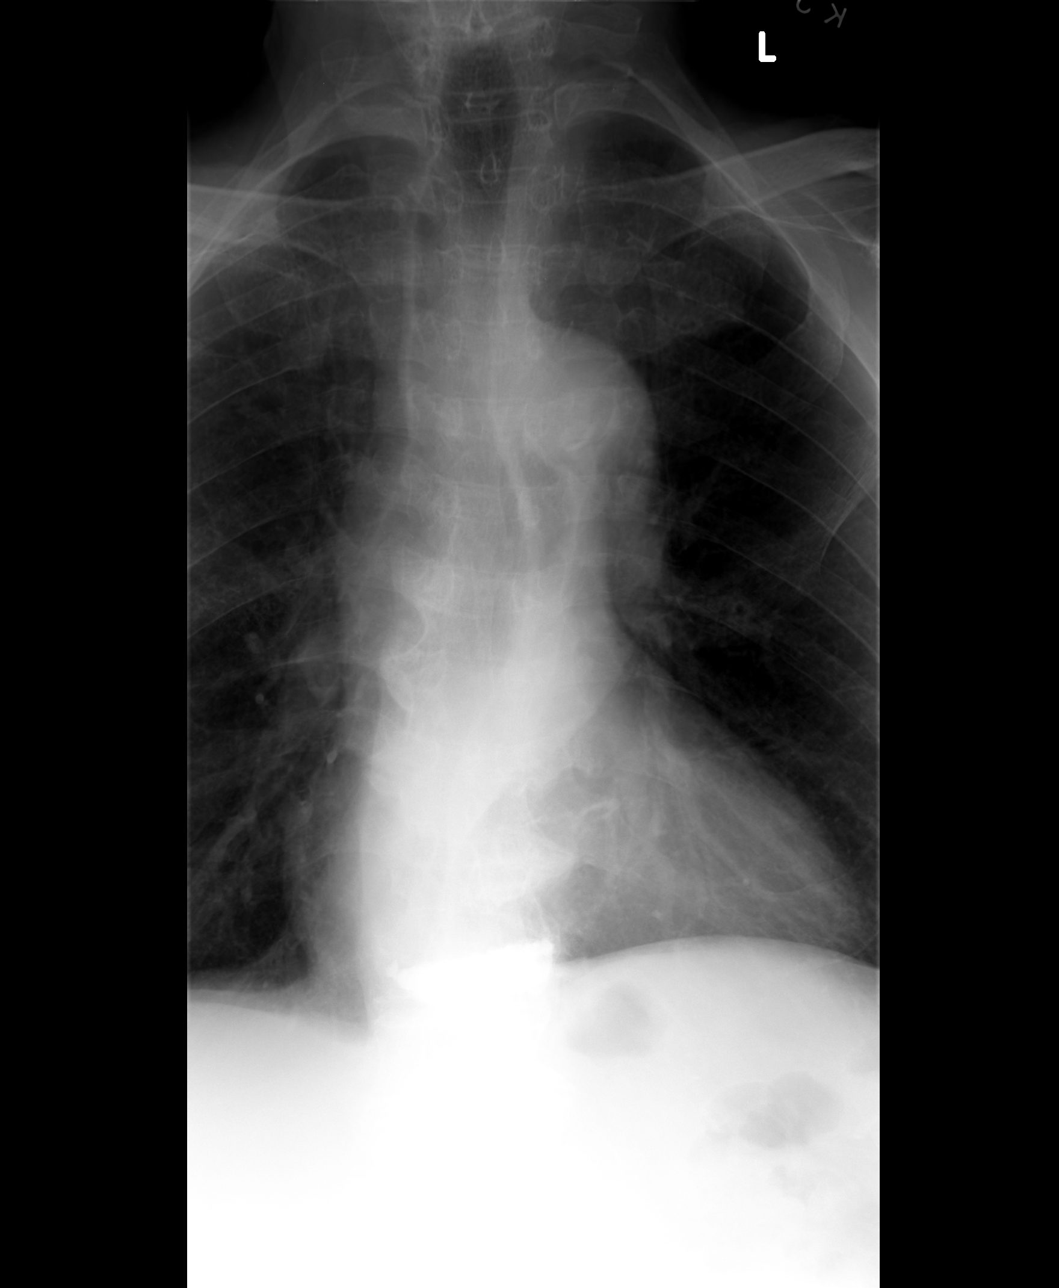

[view not recorded (2 of 3)]
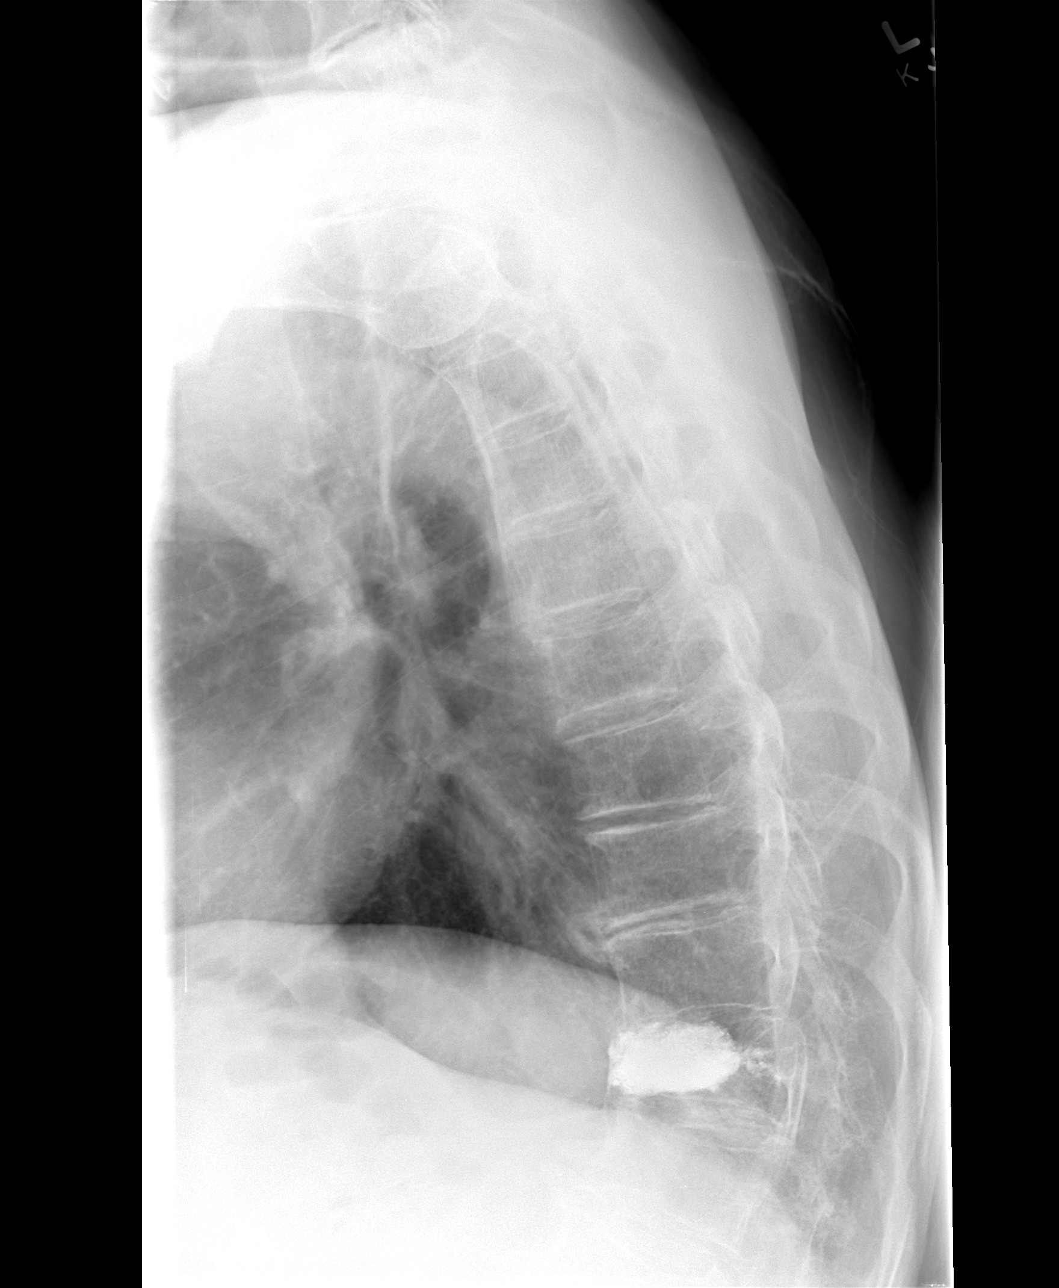

[view not recorded (3 of 3)]
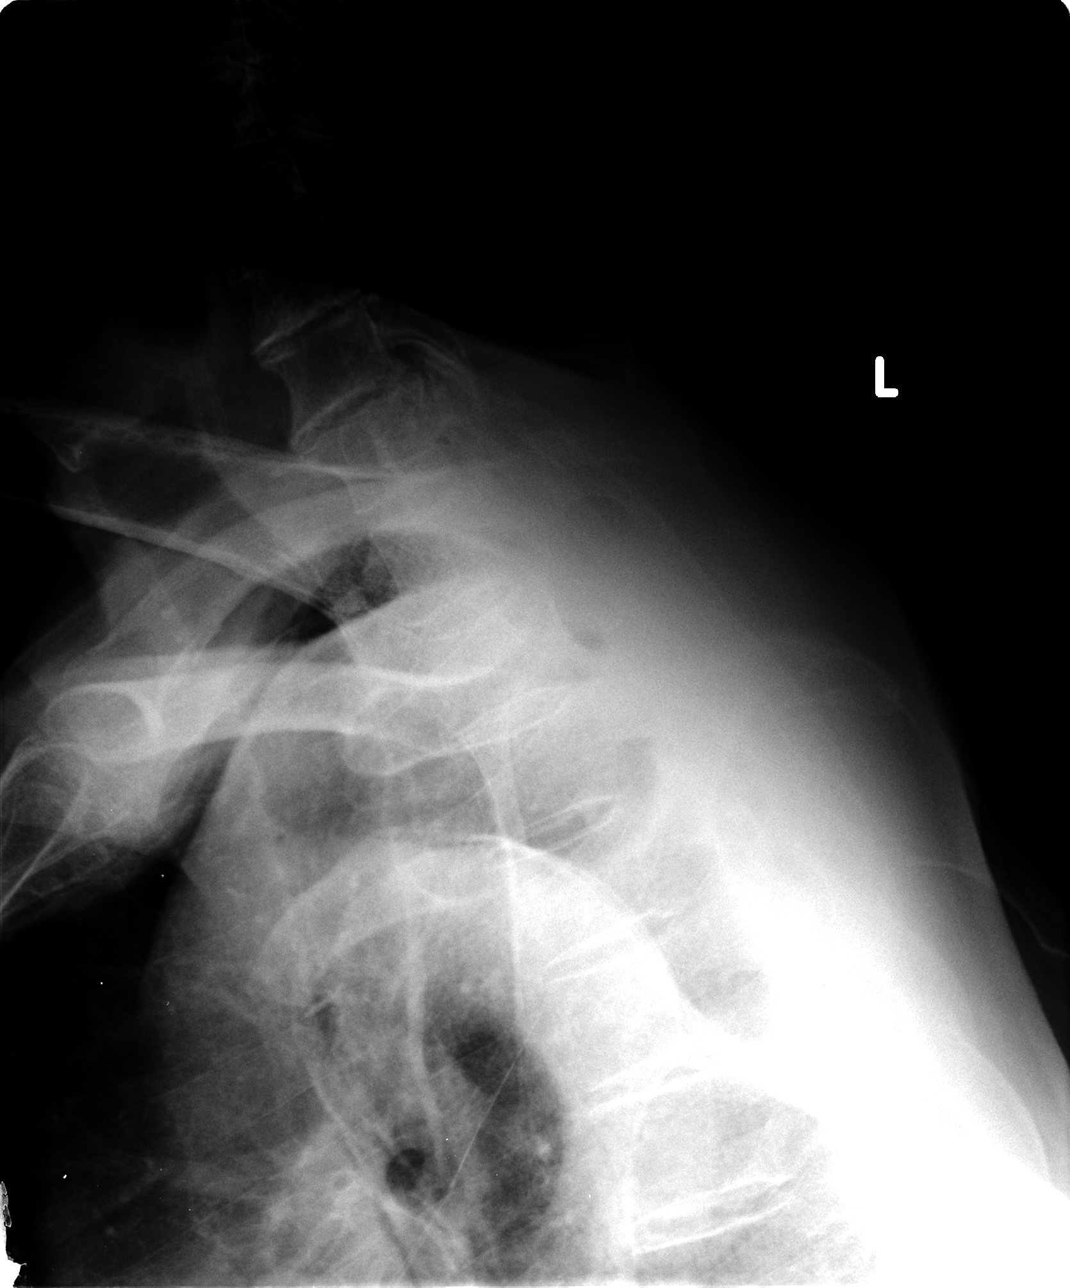

[3 of 3 positions shown; findings below may reference images not displayed]

FINDINGS: Dextroconvex scoliosis.  Previous T12 compression
fracture and augmentation with bone cement.  Stable and preserved
thoracic vertebral height and alignment otherwise.  Thoracic
segmentation is normal. Cervicothoracic junction alignment is
within normal limits.  Grossly intact posterior ribs.  Grossly
normal visualized thoracic visceral contours.}
IMPRESSION: No acute osseous abnormality in the thoracic spine.  Chronic T12
compression fracture status post augmentation.

## 2014-04-04 DIAGNOSIS — N3281 Overactive bladder: Secondary | ICD-10-CM | POA: Diagnosis not present

## 2014-04-04 DIAGNOSIS — N390 Urinary tract infection, site not specified: Secondary | ICD-10-CM | POA: Diagnosis not present

## 2014-04-04 DIAGNOSIS — R934 Abnormal findings on diagnostic imaging of urinary organs: Secondary | ICD-10-CM | POA: Diagnosis not present

## 2014-05-15 DIAGNOSIS — H538 Other visual disturbances: Secondary | ICD-10-CM | POA: Diagnosis not present

## 2014-05-15 DIAGNOSIS — H26493 Other secondary cataract, bilateral: Secondary | ICD-10-CM | POA: Diagnosis not present

## 2014-08-05 ENCOUNTER — Other Ambulatory Visit: Payer: Self-pay | Admitting: Internal Medicine

## 2014-08-07 ENCOUNTER — Encounter: Payer: Self-pay | Admitting: Internal Medicine

## 2014-08-07 ENCOUNTER — Ambulatory Visit (INDEPENDENT_AMBULATORY_CARE_PROVIDER_SITE_OTHER): Payer: Medicare Other | Admitting: Internal Medicine

## 2014-08-07 VITALS — BP 178/90 | HR 64 | Temp 98.4°F | Wt 165.1 lb

## 2014-08-07 DIAGNOSIS — R310 Gross hematuria: Secondary | ICD-10-CM

## 2014-08-07 DIAGNOSIS — R079 Chest pain, unspecified: Secondary | ICD-10-CM

## 2014-08-07 DIAGNOSIS — Z23 Encounter for immunization: Secondary | ICD-10-CM | POA: Diagnosis not present

## 2014-08-07 DIAGNOSIS — R7309 Other abnormal glucose: Secondary | ICD-10-CM | POA: Diagnosis not present

## 2014-08-07 DIAGNOSIS — R7303 Prediabetes: Secondary | ICD-10-CM

## 2014-08-07 DIAGNOSIS — M810 Age-related osteoporosis without current pathological fracture: Secondary | ICD-10-CM

## 2014-08-07 DIAGNOSIS — Z0000 Encounter for general adult medical examination without abnormal findings: Secondary | ICD-10-CM

## 2014-08-07 DIAGNOSIS — I10 Essential (primary) hypertension: Secondary | ICD-10-CM | POA: Diagnosis not present

## 2014-08-07 DIAGNOSIS — R739 Hyperglycemia, unspecified: Secondary | ICD-10-CM | POA: Insufficient documentation

## 2014-08-07 LAB — COMPREHENSIVE METABOLIC PANEL
ALK PHOS: 78 U/L (ref 39–117)
ALT: 15 U/L (ref 0–53)
AST: 17 U/L (ref 0–37)
Albumin: 4.4 g/dL (ref 3.5–5.2)
BILIRUBIN TOTAL: 0.7 mg/dL (ref 0.2–1.2)
BUN: 17 mg/dL (ref 6–23)
CO2: 31 mEq/L (ref 19–32)
Calcium: 9.6 mg/dL (ref 8.4–10.5)
Chloride: 104 mEq/L (ref 96–112)
Creatinine, Ser: 1.06 mg/dL (ref 0.40–1.50)
GFR: 71.06 mL/min (ref >60.00)
GLUCOSE: 97 mg/dL (ref 70–99)
Potassium: 3.9 mEq/L (ref 3.5–5.1)
SODIUM: 140 meq/L (ref 135–145)
Total Protein: 6.9 g/dL (ref 6.0–8.3)

## 2014-08-07 LAB — LIPID PANEL
Cholesterol: 179 mg/dL (ref 0–200)
HDL: 50 mg/dL (ref >39.00)
LDL CALC: 104 mg/dL — AB (ref 0–99)
NonHDL: 129
Total CHOL/HDL Ratio: 4
Triglycerides: 126 mg/dL (ref 0.0–149.0)
VLDL: 25.2 mg/dL (ref 0.0–40.0)

## 2014-08-07 LAB — CBC WITH DIFFERENTIAL/PLATELET
BASOS PCT: 0.3 % (ref 0.0–3.0)
Basophils Absolute: 0 10*3/uL (ref 0.0–0.1)
EOS ABS: 0.1 10*3/uL (ref 0.0–0.7)
Eosinophils Relative: 1.2 % (ref 0.0–5.0)
HCT: 48.5 % (ref 39.0–52.0)
HEMOGLOBIN: 16.5 g/dL (ref 13.0–17.0)
LYMPHS ABS: 2.2 10*3/uL (ref 0.7–4.0)
LYMPHS PCT: 25.7 % (ref 12.0–46.0)
MCHC: 34.1 g/dL (ref 30.0–36.0)
MCV: 93.6 fl (ref 78.0–100.0)
Monocytes Absolute: 0.6 10*3/uL (ref 0.1–1.0)
Monocytes Relative: 6.8 % (ref 3.0–12.0)
NEUTROS ABS: 5.6 10*3/uL (ref 1.4–7.7)
NEUTROS PCT: 66 % (ref 43.0–77.0)
Platelets: 200 10*3/uL (ref 150.0–400.0)
RBC: 5.18 Mil/uL (ref 4.22–5.81)
RDW: 13.1 % (ref 11.5–15.5)
WBC: 8.4 10*3/uL (ref 4.0–10.5)

## 2014-08-07 LAB — HEMOGLOBIN A1C: Hgb A1c MFr Bld: 5.8 % (ref 4.6–6.5)

## 2014-08-07 MED ORDER — EPINEPHRINE 0.3 MG/0.3ML IJ SOAJ: 0.3000 mg | INTRAMUSCULAR | Status: DC

## 2014-08-07 NOTE — Assessment & Plan Note (Addendum)
BP today elevated but is consistently normal when he checks at home, continue losartan, check labs

## 2014-08-07 NOTE — Assessment & Plan Note (Signed)
Borderline elevated A1c, check labs

## 2014-08-07 NOTE — Assessment & Plan Note (Signed)
Resolved

## 2014-08-07 NOTE — Assessment & Plan Note (Addendum)
Td 2013 pneumonia shot 1998 and 05-2009 prevnar today shingles shot-- 2010  Cscope 2004 per patient, was told normal  + iFOB 2013, was referred to GI but did not go, repeated test was (-) 2014 Discussed colonoscopies, given age there is no strong indication; he will call if interested in a colonoscopy, iFOB, Cologuard  Diet and exercise discussed

## 2014-08-07 NOTE — Patient Instructions (Signed)
Get your blood work before you leave   Please come back to the office in 6 months  for a routine check up (BP)    Fall Prevention and Home Safety Falls cause injuries and can affect all age groups. It is possible to use preventive measures to significantly decrease the likelihood of falls. There are many simple measures which can make your home safer and prevent falls. OUTDOORS  Repair cracks and edges of walkways and driveways.  Remove high doorway thresholds.  Trim shrubbery on the main path into your home.  Have good outside lighting.  Clear walkways of tools, rocks, debris, and clutter.  Check that handrails are not broken and are securely fastened. Both sides of steps should have handrails.  Have leaves, snow, and ice cleared regularly.  Use sand or salt on walkways during winter months.  In the garage, clean up grease or oil spills. BATHROOM  Install night lights.  Install grab bars by the toilet and in the tub and shower.  Use non-skid mats or decals in the tub or shower.  Place a plastic non-slip stool in the shower to sit on, if needed.  Keep floors dry and clean up all water on the floor immediately.  Remove soap buildup in the tub or shower on a regular basis.  Secure bath mats with non-slip, double-sided rug tape.  Remove throw rugs and tripping hazards from the floors. BEDROOMS  Install night lights.  Make sure a bedside light is easy to reach.  Do not use oversized bedding.  Keep a telephone by your bedside.  Have a firm chair with side arms to use for getting dressed.  Remove throw rugs and tripping hazards from the floor. KITCHEN  Keep handles on pots and pans turned toward the center of the stove. Use back burners when possible.  Clean up spills quickly and allow time for drying.  Avoid walking on wet floors.  Avoid hot utensils and knives.  Position shelves so they are not too high or low.  Place commonly used objects within easy  reach.  If necessary, use a sturdy step stool with a grab bar when reaching.  Keep electrical cables out of the way.  Do not use floor polish or wax that makes floors slippery. If you must use wax, use non-skid floor wax.  Remove throw rugs and tripping hazards from the floor. STAIRWAYS  Never leave objects on stairs.  Place handrails on both sides of stairways and use them. Fix any loose handrails. Make sure handrails on both sides of the stairways are as long as the stairs.  Check carpeting to make sure it is firmly attached along stairs. Make repairs to worn or loose carpet promptly.  Avoid placing throw rugs at the top or bottom of stairways, or properly secure the rug with carpet tape to prevent slippage. Get rid of throw rugs, if possible.  Have an electrician put in a light switch at the top and bottom of the stairs. OTHER FALL PREVENTION TIPS  Wear low-heel or rubber-soled shoes that are supportive and fit well. Wear closed toe shoes.  When using a stepladder, make sure it is fully opened and both spreaders are firmly locked. Do not climb a closed stepladder.  Add color or contrast paint or tape to grab bars and handrails in your home. Place contrasting color strips on first and last steps.  Learn and use mobility aids as needed. Install an electrical emergency response system.  Turn on lights to  avoid dark areas. Replace light bulbs that burn out immediately. Get light switches that glow.  Arrange furniture to create clear pathways. Keep furniture in the same place.  Firmly attach carpet with non-skid or double-sided tape.  Eliminate uneven floor surfaces.  Select a carpet pattern that does not visually hide the edge of steps.  Be aware of all pets. OTHER HOME SAFETY TIPS  Set the water temperature for 120 F (48.8 C).  Keep emergency numbers on or near the telephone.  Keep smoke detectors on every level of the home and near sleeping areas. Document Released:  05/22/2002 Document Revised: 12/01/2011 Document Reviewed: 08/21/2011 [facility] Patient Information 2015 [place], [place]. This information is not intended to replace advice given to you by your health care provider. Make sure you discuss any questions you have with your health care provider.   Preventive Care for Adults Ages 35 and over  Blood pressure check.** / Every 1 to 2 years.  Lipid and cholesterol check.**/ Every 5 years beginning at age 44.  Lung cancer screening. / Every year if you are aged 74-80 years and have a 30-pack-year history of smoking and currently smoke or have quit within the past 15 years. Yearly screening is stopped once you have quit smoking for at least 15 years or develop a health problem that would prevent you from having lung cancer treatment.  Fecal occult blood test (FOBT) of stool. / Every year beginning at age 29 and continuing until age 20. You may not have to do this test if you get a colonoscopy every 10 years.  Flexible sigmoidoscopy** or colonoscopy.** / Every 5 years for a flexible sigmoidoscopy or every 10 years for a colonoscopy beginning at age 36 and continuing until age 76.  Hepatitis C blood test.** / For all people born from [birth year] through 1963 and any individual with known risks for hepatitis C.  Abdominal aortic aneurysm (AAA) screening.** / A one-time screening for ages 46 to 88 years who are current or former smokers.  Skin self-exam. / Monthly.  Influenza vaccine. / Every year.  Tetanus, diphtheria, and acellular pertussis (Tdap/Td) vaccine.** / 1 dose of Td every 10 years.  Varicella vaccine.** / Consult your health care provider.  Zoster vaccine.** / 1 dose for adults aged 6 years or older.  Pneumococcal 13-valent conjugate (PCV13) vaccine.** / Consult your health care provider.  Pneumococcal polysaccharide (PPSV23) vaccine.** / 1 dose for all adults aged 69 years and older.  Meningococcal vaccine.** / Consult your health care  provider.  Hepatitis A vaccine.** / Consult your health care provider.  Hepatitis B vaccine.** / Consult your health care provider.  Haemophilus influenzae type b (Hib) vaccine.** / Consult your health care provider. **Family history and personal history of risk and conditions may change your health care provider's recommendations. Document Released: 07/28/2001 Document Revised: 06/06/2013 Document Reviewed: 10/27/2010 [employer] Patient Information 2015 [place], [place]. This information is not intended to replace advice given to you by your health care provider. Make sure you discuss any questions you have with your health care provider.

## 2014-08-07 NOTE — Assessment & Plan Note (Signed)
Last bone density test November 2014, T score -3.3, has declined medications other than calcium and vitamin D before, again reminded his options (po, IV, SQ meds)

## 2014-08-07 NOTE — Progress Notes (Signed)
Pre visit review using our clinic review tool, if applicable. No additional management support is needed unless otherwise documented below in the visit note. 

## 2014-08-07 NOTE — Progress Notes (Signed)
Subjective:    Patient ID: Cory Zavala, male    DOB: [birth year], 81 y.o.   MRN: [id number]  DOS:  08/07/2014 Type of visit - description :   Here for Medicare AWV: 1. Risk factors based on Past M, S, F history: reviewed   2.  Physical Activities: walks 2 miles a day (weather permit), yard work, still preaches on weekends 3. Depression/mood: (-) screening   4. Hearing: normal per patient, no problems noted   5. ADL's: totally independent, drive  6. Fall Risk: no recent problems , prevention discussed , see instructions   7. Home Safety: does feel safe at home 8. Height, weight, &visual acuity: see VS, h/o cataract surgery , vision good, had a recent check up 9. Counseling: yes 10. Labs ordered based on risk factors: yes   11. Referral Coordination, if needed   12. Care Plan, see a/p  , written plan provided  13.  Cognitive Assessment: cognition, motor skills and memory seem appropriate 14. Care team updated 15. End of care discussed , rec to get a POA and gives [place] a copy  in addition, we discussed the following Hypertension, good compliance with medication, ambulatory BPs are consistently 130, 140. History of allergic reactions, needs a EpiPen refill. Last year had episode of gross hematuria, was evaluated by urology, notes, CTs and MRIs reviewed. Patient is currently asymptomatic.    Review of Systems Other than occasionally urinary urgency review of systems is negative  Constitutional: No fever, chills. No unexplained wt changes. No unusual sweats HEENT: No dental problems, ear discharge, facial swelling, voice changes. No eye discharge, redness or intolerance to light Respiratory: No wheezing or difficulty breathing. No cough , mucus production Cardiovascular: No CP, leg swelling or palpitations GI: no nausea, vomiting, diarrhea or abdominal pain.  No blood in the stools. No dysphagia   Endocrine: No polyphagia, polyuria or polydipsia GU: No dysuria, gross hematuria,  difficulty urinating. . Musculoskeletal: No joint swellings or unusual aches or pains Skin: No change in the color of the skin, palor or rash Allergic, immunologic: No environmental allergies or food allergies Neurological: No dizziness or syncope. No headaches. No diplopia, slurred speech, motor deficits, facial numbness Hematological: No enlarged lymph nodes, easy bruising or bleeding Psychiatry: No suicidal ideas, hallucinations, behavior problems or confusion. No unusual/severe anxiety or depression.     Past Medical History  Diagnosis Date  . HTN (hypertension)   . Back pain, chronic   . GERD (gastroesophageal reflux disease) 09/15/2012  . BPH (benign prostatic hypertrophy)   . Diverticulosis of colon     LEFT  . ED (erectile dysfunction)   . Ureter neoplasm     RIGHT  . Renal neoplasm     RIGHT  . Osteoporosis   . RBBB   . Normal cardiac stress test     PER CARDIOLOGIST NOTE, DR NISHAN, APPROX.  1994  . Nocturia     Past Surgical History  Procedure Laterality Date  . Vertebroplasty  2001  . Cataract extraction w/ intraocular lens  implant, bilateral  2010  . Colonoscopy  05-22-2003  . Exercise treadmill test  11-04-2012   DR Johnsie Cancel    NORMAL-  NO EVIDENCE OF ISCHEMIA BY ST ANALYSIS  . Cystoscopy with retrograde pyelogram, ureteroscopy and stent placement Right 12/25/2013    Procedure: CYSTOSCOPY WITH RIGHT RETROGRADE PYELOGRAM, RIGHT URETEROSCOPY  BIOPSY AND STENT: BLADDER BIOPSY;  Surgeon: Claybon Jabs, MD;  Location: [facility];  Service: Urology;  Laterality: Right;    History   Social History  . Marital Status: Married    Spouse Name: N/A  . Number of Children: 2  . Years of Education: N/A   Occupational History  . pastor, semi retired     Social History Main Topics  . Smoking status: Never Smoker   . Smokeless tobacco: Never Used  . Alcohol Use: No  . Drug Use: No  . Sexual Activity: Not on file   Other Topics Concern  . Not on file    Social History Narrative   Retired, patient was a Company secretary still preachs, Educational psychologist   Married > 65 years, lost wife aprox 5-11. remarried           Family History  Problem Relation Age of Onset  . CAD Neg Hx   . Diabetes Neg Hx   . Colon cancer Neg Hx   . Prostate cancer Neg Hx        Medication List       This list is accurate as of: 08/07/14 11:59 PM.  Always use your most recent med list.               aspirin 81 MG tablet  Take 81 mg by mouth daily.     EPINEPHrine 0.3 mg/0.3 mL Soaj injection  Commonly known as:  EPI-PEN  Inject 0.3 mLs (0.3 mg total) into the muscle as directed.     losartan 50 MG tablet  Commonly known as:  COZAAR  TAKE 1 TABLET BY MOUTH EVERY DAY     multivitamin tablet  Take 1 tablet by mouth daily.     OSTEO ADVANCE PO  Take 4 tablets by mouth daily. OSTEO GOLD (BRAND)     ranitidine 75 MG tablet  Commonly known as:  ZANTAC  Take 150 mg by mouth 2 (two) times daily.     vitamin B-12 500 MCG tablet  Commonly known as:  CYANOCOBALAMIN  Take 500 mcg by mouth daily.           Objective:   Physical Exam BP 178/90 mmHg  Pulse 64  Temp(Src) 98.4 F (36.9 C) (Oral)  Wt 165 lb 2 oz (74.9 kg)  SpO2 97% General:   Well developed, well nourished . NAD.  Neck:  Full range of motion. Supple. No  thyromegaly , normal carotid pulse . HEENT:  Normocephalic . Face symmetric, atraumatic Lungs:  CTA B Normal respiratory effort, no intercostal retractions, no accessory muscle use. Heart: RRR,  no murmur.  Abdomen:  Not distended, soft, non-tender. No rebound or rigidity. No mass,organomegaly Muscle skeletal: no pretibial edema bilaterally  Skin: Exposed areas without rash. Not pale. Not jaundice Neurologic:  alert & oriented X3.  Speech normal, gait appropriate for age and unassisted Strength symmetric and appropriate for age.  Psych: Cognition and judgment appear intact.  Cooperative with normal attention span and  concentration.  Behavior appropriate. No anxious or depressed appearing.        Assessment & Plan:

## 2014-08-09 ENCOUNTER — Encounter: Payer: Self-pay | Admitting: Internal Medicine

## 2014-08-09 DIAGNOSIS — R310 Gross hematuria: Secondary | ICD-10-CM | POA: Insufficient documentation

## 2014-08-09 NOTE — Assessment & Plan Note (Signed)
Episode of gross hematuria, 2015 Initial CT show a question of urothelial cancer, subsequently a ureteroscopy was done and reportedly normal, subsequently a biopsy was negative. Follow-up MRI 01-2014 show improvement. Last visit with urology [phone]. They recommended follow-up in a year.

## 2014-11-07 ENCOUNTER — Other Ambulatory Visit: Payer: Self-pay | Admitting: Internal Medicine

## 2015-03-03 ENCOUNTER — Other Ambulatory Visit: Payer: Self-pay | Admitting: Internal Medicine

## 2015-03-04 ENCOUNTER — Other Ambulatory Visit: Payer: Self-pay | Admitting: Internal Medicine

## 2015-03-11 DIAGNOSIS — Z23 Encounter for immunization: Secondary | ICD-10-CM | POA: Diagnosis not present

## 2015-05-01 DIAGNOSIS — N281 Cyst of kidney, acquired: Secondary | ICD-10-CM | POA: Diagnosis not present

## 2015-05-01 DIAGNOSIS — R972 Elevated prostate specific antigen [PSA]: Secondary | ICD-10-CM | POA: Diagnosis not present

## 2015-05-01 DIAGNOSIS — N138 Other obstructive and reflux uropathy: Secondary | ICD-10-CM | POA: Diagnosis not present

## 2015-05-01 DIAGNOSIS — N401 Enlarged prostate with lower urinary tract symptoms: Secondary | ICD-10-CM | POA: Diagnosis not present

## 2015-05-06 ENCOUNTER — Other Ambulatory Visit: Payer: Self-pay | Admitting: Internal Medicine

## 2015-05-14 ENCOUNTER — Ambulatory Visit (INDEPENDENT_AMBULATORY_CARE_PROVIDER_SITE_OTHER): Payer: Medicare Other | Admitting: Internal Medicine

## 2015-05-14 ENCOUNTER — Encounter: Payer: Self-pay | Admitting: Internal Medicine

## 2015-05-14 VITALS — BP 128/76 | HR 59 | Temp 98.0°F | Ht 70.0 in | Wt 162.3 lb

## 2015-05-14 DIAGNOSIS — I10 Essential (primary) hypertension: Secondary | ICD-10-CM

## 2015-05-14 DIAGNOSIS — L989 Disorder of the skin and subcutaneous tissue, unspecified: Secondary | ICD-10-CM | POA: Diagnosis not present

## 2015-05-14 DIAGNOSIS — Z09 Encounter for follow-up examination after completed treatment for conditions other than malignant neoplasm: Secondary | ICD-10-CM

## 2015-05-14 DIAGNOSIS — K219 Gastro-esophageal reflux disease without esophagitis: Secondary | ICD-10-CM | POA: Diagnosis not present

## 2015-05-14 LAB — BASIC METABOLIC PANEL
BUN: 20 mg/dL (ref 6–23)
CALCIUM: 9.1 mg/dL (ref 8.4–10.5)
CHLORIDE: 106 meq/L (ref 96–112)
CO2: 29 meq/L (ref 19–32)
Creatinine, Ser: 1.06 mg/dL (ref 0.40–1.50)
GFR: 70.93 mL/min (ref >60.00)
GLUCOSE: 101 mg/dL — AB (ref 70–99)
Potassium: 4 mEq/L (ref 3.5–5.1)
SODIUM: 143 meq/L (ref 135–145)

## 2015-05-14 NOTE — Patient Instructions (Signed)
Get your blood work before you leave    Check the  blood pressure 2 or 3 times a month    Be sure your blood pressure is between 110/65 and  145/85.  if it is consistently higher or lower, let me know   Next visit  for a physical exam by February 2017 Please schedule an appointment at the front desk Please come back fasting

## 2015-05-14 NOTE — Progress Notes (Signed)
Pre visit review using our clinic review tool, if applicable. No additional management support is needed unless otherwise documented below in the visit note. 

## 2015-05-14 NOTE — Progress Notes (Signed)
Subjective:    Patient ID: Cory Zavala, male    DOB: [birth year], 81 y.o.   MRN: [id number]  DOS:  05/14/2015 Type of visit - description : routine checkup Interval history: HTN: Well controlled with current medications, no apparent side effects, ambulatory BPs very good Skin lesion at the left face, dermatology referral?. No recent change in color, size.  Review of Systems  Denies chest pain or difficulty breathing No nausea, vomiting , diarrhea   Past Medical History  Diagnosis Date  . HTN (hypertension)   . Back pain, chronic   . GERD (gastroesophageal reflux disease) 09/15/2012  . BPH (benign prostatic hypertrophy)   . Diverticulosis of colon     LEFT  . ED (erectile dysfunction)   . Gross hematuria     2015  . Osteoporosis   . RBBB   . Normal cardiac stress test     PER CARDIOLOGIST NOTE, DR NISHAN, APPROX.  1994  . Nocturia     Past Surgical History  Procedure Laterality Date  . Vertebroplasty  2001  . Cataract extraction w/ intraocular lens  implant, bilateral  2010  . Colonoscopy  05-22-2003  . Exercise treadmill test  11-04-2012   DR Johnsie Cancel    NORMAL-  NO EVIDENCE OF ISCHEMIA BY ST ANALYSIS  . Cystoscopy with retrograde pyelogram, ureteroscopy and stent placement Right 12/25/2013    Procedure: CYSTOSCOPY WITH RIGHT RETROGRADE PYELOGRAM, RIGHT URETEROSCOPY  BIOPSY AND STENT: BLADDER BIOPSY;  Surgeon: Claybon Jabs, MD;  Location: [facility];  Service: Urology;  Laterality: Right;    Social History   Social History  . Marital Status: Married    Spouse Name: N/A  . Number of Children: 2  . Years of Education: N/A   Occupational History  . pastor, semi retired     Social History Main Topics  . Smoking status: Never Smoker   . Smokeless tobacco: Never Used  . Alcohol Use: No  . Drug Use: No  . Sexual Activity: Not on file   Other Topics Concern  . Not on file   Social History Narrative   Retired, patient was a Company secretary still  preachs, Educational psychologist   Married > 51 years, lost wife aprox 5-11. remarried              Medication List       This list is accurate as of: 05/14/15 11:59 PM.  Always use your most recent med list.               aspirin 81 MG tablet  Take 81 mg by mouth daily.     EPINEPHrine 0.3 mg/0.3 mL Soaj injection  Commonly known as:  EPI-PEN  Inject 0.3 mLs (0.3 mg total) into the muscle as directed.     losartan 50 MG tablet  Commonly known as:  COZAAR  Take 1 tablet (50 mg total) by mouth daily.     multivitamin tablet  Take 1 tablet by mouth daily.     NEXIUM 20 MG capsule  Generic drug:  esomeprazole  Take 20 mg by mouth daily at 12 noon.     OSTEO ADVANCE PO  Take 4 tablets by mouth daily. OSTEO GOLD (BRAND)     vitamin B-12 500 MCG tablet  Commonly known as:  CYANOCOBALAMIN  Take 500 mcg by mouth daily.           Objective:   Physical Exam  HENT:  Head:     BP  128/76 mmHg  Pulse 59  Temp(Src) 98 F (36.7 C) (Oral)  Ht 5\' 10"  (1.778 m)  Wt 162 lb 5 oz (73.624 kg)  BMI 23.29 kg/m2  SpO2 95% General:   Well developed, well nourished . NAD.  HEENT:  Normocephalic . Face symmetric, atraumatic Lungs:  CTA B Normal respiratory effort, no intercostal retractions, no accessory muscle use. Heart: RRR,  no murmur.  No pretibial edema bilaterally  Skin: Not pale. Not jaundice Neurologic:  alert & oriented X3.  Speech normal, gait appropriate for age and unassisted Psych--  Cognition and judgment appear intact.  Cooperative with normal attention span and concentration.  Behavior appropriate. No anxious or depressed appearing.      Assessment & Plan:   Assessment > HTN GERD RBBB MSK: --Chronic back pain -- Vertebroplasty 2001 Osteoporosis T score -3.3 (2014), declined all meds GU: ---BPH ---ED ---Gross hematuria 2015,  Initial CT show a question of urothelial cancer, subsequently a ureteroscopy was done and reportedly normal, subsequently a  biopsy was negative. Follow-up MRI 01-2014 show improvement. Saw urology ~ 04-2015 Urology check PSAs Exercise stress test 2014 negative  PLAN: HTN: controlled, check a BMP GERD: pt changed to OTC Nexium , sx controlled Skin lesion: likely SK, likes to see derm, Dr Lupton>>> referral RTC 6 months CPX

## 2015-05-15 DIAGNOSIS — Z09 Encounter for follow-up examination after completed treatment for conditions other than malignant neoplasm: Secondary | ICD-10-CM | POA: Insufficient documentation

## 2015-05-15 NOTE — Assessment & Plan Note (Signed)
HTN: controlled, check a BMP GERD: pt changed to OTC Nexium , sx controlled Skin lesion: likely SK, likes to see derm, Dr Lupton>>> referral RTC 6 months CPX

## 2015-06-10 ENCOUNTER — Other Ambulatory Visit: Payer: Self-pay | Admitting: Internal Medicine

## 2015-08-21 ENCOUNTER — Other Ambulatory Visit: Payer: Self-pay | Admitting: Internal Medicine

## 2015-09-25 ENCOUNTER — Other Ambulatory Visit: Payer: Self-pay | Admitting: Internal Medicine

## 2015-09-25 ENCOUNTER — Telehealth: Payer: Self-pay | Admitting: Internal Medicine

## 2015-09-25 MED ORDER — LOSARTAN POTASSIUM 50 MG PO TABS: 50.0000 mg | ORAL_TABLET | Freq: Every day | ORAL | Status: DC

## 2015-09-25 NOTE — Telephone Encounter (Signed)
Rx sent 

## 2015-09-25 NOTE — Telephone Encounter (Signed)
Can be reached: [phone] Pharmacy: [id number] - [place], [place]  Reason for call: pt called for refill on losartan. He has 1 left. Scheduled for appt 4/17 10:45

## 2015-09-30 ENCOUNTER — Ambulatory Visit (INDEPENDENT_AMBULATORY_CARE_PROVIDER_SITE_OTHER): Payer: Medicare Other | Admitting: Internal Medicine

## 2015-09-30 ENCOUNTER — Encounter: Payer: Self-pay | Admitting: Internal Medicine

## 2015-09-30 VITALS — BP 124/72 | HR 67 | Temp 98.3°F | Ht 70.0 in | Wt 166.0 lb

## 2015-09-30 DIAGNOSIS — Z09 Encounter for follow-up examination after completed treatment for conditions other than malignant neoplasm: Secondary | ICD-10-CM | POA: Diagnosis not present

## 2015-09-30 DIAGNOSIS — J302 Other seasonal allergic rhinitis: Secondary | ICD-10-CM | POA: Insufficient documentation

## 2015-09-30 DIAGNOSIS — I10 Essential (primary) hypertension: Secondary | ICD-10-CM | POA: Diagnosis not present

## 2015-09-30 DIAGNOSIS — K219 Gastro-esophageal reflux disease without esophagitis: Secondary | ICD-10-CM

## 2015-09-30 MED ORDER — LOSARTAN POTASSIUM 50 MG PO TABS: 50.0000 mg | ORAL_TABLET | Freq: Every day | ORAL | Status: DC

## 2015-09-30 MED ORDER — AZELASTINE HCL 0.1 % NA SOLN: 2.0000 | Freq: Every evening | NASAL | Status: DC | PRN

## 2015-09-30 NOTE — Progress Notes (Signed)
Subjective:    Patient ID: Cory Zavala, male    DOB: [birth year], 82 y.o.   MRN: [id number]  DOS:  09/30/2015 Type of visit - description : Follow-up Interval history: HTN: On losartan, needs a refill, BP today is very good, ambulatory BPs when checked normal. Also 4-5 weeks history of runny nose, wide nasal discharge. Denies sinus pain but has some sinus congestion. GERD: Well controlled on Nexium.   Review of Systems  No fever or chills No itchy eyes or nose, some sneezing, very little cough  Past Medical History  Diagnosis Date  . HTN (hypertension)   . Back pain, chronic   . GERD (gastroesophageal reflux disease) 09/15/2012  . BPH (benign prostatic hypertrophy)   . Diverticulosis of colon     LEFT  . ED (erectile dysfunction)   . Gross hematuria     2015  . Osteoporosis   . RBBB   . Normal cardiac stress test     PER CARDIOLOGIST NOTE, DR NISHAN, APPROX.  1994  . Nocturia     Past Surgical History  Procedure Laterality Date  . Vertebroplasty  2001  . Cataract extraction w/ intraocular lens  implant, bilateral  2010  . Colonoscopy  05-22-2003  . Exercise treadmill test  11-04-2012   DR Johnsie Cancel    NORMAL-  NO EVIDENCE OF ISCHEMIA BY ST ANALYSIS  . Cystoscopy with retrograde pyelogram, ureteroscopy and stent placement Right 12/25/2013    Procedure: CYSTOSCOPY WITH RIGHT RETROGRADE PYELOGRAM, RIGHT URETEROSCOPY  BIOPSY AND STENT: BLADDER BIOPSY;  Surgeon: Claybon Jabs, MD;  Location: [facility];  Service: Urology;  Laterality: Right;    Social History   Social History  . Marital Status: Married    Spouse Name: N/A  . Number of Children: 2  . Years of Education: N/A   Occupational History  . pastor, semi retired     Social History Main Topics  . Smoking status: Never Smoker   . Smokeless tobacco: Never Used  . Alcohol Use: No  . Drug Use: No  . Sexual Activity: Not on file   Other Topics Concern  . Not on file   Social History Narrative    Retired, patient was a Company secretary still preachs, Educational psychologist   Married > 14 years, lost wife aprox 5-11. remarried              Medication List       This list is accurate as of: 09/30/15  4:47 PM.  Always use your most recent med list.               aspirin 81 MG tablet  Take 81 mg by mouth daily.     azelastine 0.1 % nasal spray  Commonly known as:  ASTELIN  Place 2 sprays into both nostrils at bedtime as needed for rhinitis. Use in each nostril as directed     EPINEPHrine 0.3 mg/0.3 mL Soaj injection  Commonly known as:  EPI-PEN  Inject 0.3 mLs (0.3 mg total) into the muscle as directed.     losartan 50 MG tablet  Commonly known as:  COZAAR  Take 1 tablet (50 mg total) by mouth daily.     multivitamin tablet  Take 1 tablet by mouth daily.     NEXIUM 20 MG capsule  Generic drug:  esomeprazole  Take 20 mg by mouth daily at 12 noon.     OSTEO ADVANCE PO  Take 4 tablets by mouth daily. OSTEO  GOLD (BRAND)     vitamin B-12 500 MCG tablet  Commonly known as:  CYANOCOBALAMIN  Take 500 mcg by mouth daily.           Objective:   Physical Exam BP 124/72 mmHg  Pulse 67  Temp(Src) 98.3 F (36.8 C) (Oral)  Ht 5\' 10"  (1.778 m)  Wt 166 lb (75.297 kg)  BMI 23.82 kg/m2  SpO2 96% General:   Well developed, well nourished . NAD.  HEENT:  Normocephalic . Face symmetric, atraumatic Nose: Congested Sinuses no TTP TMs normal Throat symmetric, no red Lungs:  CTA B Normal respiratory effort, no intercostal retractions, no accessory muscle use. Heart: RRR,  no murmur.  No pretibial edema bilaterally  Skin: Not pale. Not jaundice Neurologic:  alert & oriented X3.  Speech normal, gait appropriate for age and unassisted Psych--  Cognition and judgment appear intact.  Cooperative with normal attention span and concentration.  Behavior appropriate. No anxious or depressed appearing.      Assessment & Plan:   Assessment > HTN GERD RBBB MSK: --Chronic back  pain -- Vertebroplasty 2001 Osteoporosis T score -3.3 (2014), declined all meds GU: ---BPH ---ED ---Gross hematuria 2015,  Initial CT show a question of urothelial cancer, subsequently a ureteroscopy was done and reportedly normal, subsequently a biopsy was negative. Follow-up MRI 01-2014 show improvement. Saw urology ~ 04-2015 Urology check PSAs Exercise stress test 2014 negative  PLAN: HTN: On losartan, refill send, last BMP satisfactory GERD: Well controlled on  PPIs Allergies: Upper respiratory symptoms likely due to allergies, dc Claritin and Afrin nasal spray, see instructions. RTC 3 months, CPX

## 2015-09-30 NOTE — Patient Instructions (Addendum)
Change Claritin to Zyrtec 10 mg one tablet every night  Stop Afrin  Use Flonase (OTC)  2 sprays on each side of the nose every morning Use Astelin (prescription sent) 2 sprays on each side of the nose every night  If you are not better in few days let me know  Once allergy season is over ok to stop meds   Come back in 3 months, fasting for general checkup

## 2015-09-30 NOTE — Assessment & Plan Note (Signed)
HTN: On losartan, refill send, last BMP satisfactory GERD: Well controlled on  PPIs Allergies: Upper respiratory symptoms likely due to allergies, dc Claritin and Afrin nasal spray, see instructions. RTC 3 months, CPX

## 2015-09-30 NOTE — Progress Notes (Signed)
Pre visit review using our clinic review tool, if applicable. No additional management support is needed unless otherwise documented below in the visit note. 

## 2015-10-08 ENCOUNTER — Other Ambulatory Visit: Payer: Self-pay | Admitting: Internal Medicine

## 2015-10-08 NOTE — Telephone Encounter (Signed)
Refill sent per [facility] refill protocol/SLS  

## 2015-10-15 DIAGNOSIS — L570 Actinic keratosis: Secondary | ICD-10-CM | POA: Diagnosis not present

## 2015-10-15 DIAGNOSIS — L820 Inflamed seborrheic keratosis: Secondary | ICD-10-CM | POA: Diagnosis not present

## 2015-12-31 ENCOUNTER — Encounter: Payer: Self-pay | Admitting: Internal Medicine

## 2015-12-31 ENCOUNTER — Ambulatory Visit (INDEPENDENT_AMBULATORY_CARE_PROVIDER_SITE_OTHER): Payer: Medicare Other | Admitting: Internal Medicine

## 2015-12-31 VITALS — BP 118/72 | HR 51 | Temp 98.0°F | Ht 70.0 in | Wt 161.0 lb

## 2015-12-31 DIAGNOSIS — R1013 Epigastric pain: Secondary | ICD-10-CM | POA: Diagnosis not present

## 2015-12-31 DIAGNOSIS — R739 Hyperglycemia, unspecified: Secondary | ICD-10-CM | POA: Diagnosis not present

## 2015-12-31 DIAGNOSIS — I10 Essential (primary) hypertension: Secondary | ICD-10-CM

## 2015-12-31 LAB — CBC WITH DIFFERENTIAL/PLATELET
BASOS ABS: 0 10*3/uL (ref 0.0–0.1)
Basophils Relative: 0.3 % (ref 0.0–3.0)
Eosinophils Absolute: 0.1 10*3/uL (ref 0.0–0.7)
Eosinophils Relative: 1.7 % (ref 0.0–5.0)
HEMATOCRIT: 44.7 % (ref 39.0–52.0)
Hemoglobin: 15.1 g/dL (ref 13.0–17.0)
LYMPHS PCT: 34 % (ref 12.0–46.0)
Lymphs Abs: 2.2 10*3/uL (ref 0.7–4.0)
MCHC: 33.9 g/dL (ref 30.0–36.0)
MCV: 93.9 fl (ref 78.0–100.0)
MONOS PCT: 8.8 % (ref 3.0–12.0)
Monocytes Absolute: 0.6 10*3/uL (ref 0.1–1.0)
Neutro Abs: 3.6 10*3/uL (ref 1.4–7.7)
Neutrophils Relative %: 55.2 % (ref 43.0–77.0)
PLATELETS: 185 10*3/uL (ref 150.0–400.0)
RBC: 4.76 Mil/uL (ref 4.22–5.81)
RDW: 13.1 % (ref 11.5–15.5)
WBC: 6.6 10*3/uL (ref 4.0–10.5)

## 2015-12-31 LAB — LIPID PANEL
CHOL/HDL RATIO: 3
Cholesterol: 151 mg/dL (ref 0–200)
HDL: 48.4 mg/dL (ref >39.00)
LDL Cholesterol: 91 mg/dL (ref 0–99)
NONHDL: 102.41
Triglycerides: 59 mg/dL (ref 0.0–149.0)
VLDL: 11.8 mg/dL (ref 0.0–40.0)

## 2015-12-31 LAB — COMPREHENSIVE METABOLIC PANEL
ALT: 16 U/L (ref 0–53)
AST: 18 U/L (ref 0–37)
Albumin: 4 g/dL (ref 3.5–5.2)
Alkaline Phosphatase: 70 U/L (ref 39–117)
BUN: 24 mg/dL — ABNORMAL HIGH (ref 6–23)
CHLORIDE: 106 meq/L (ref 96–112)
CO2: 31 mEq/L (ref 19–32)
Calcium: 9.4 mg/dL (ref 8.4–10.5)
Creatinine, Ser: 1.16 mg/dL (ref 0.40–1.50)
GFR: 63.82 mL/min (ref >60.00)
GLUCOSE: 100 mg/dL — AB (ref 70–99)
Potassium: 3.8 mEq/L (ref 3.5–5.1)
Sodium: 142 mEq/L (ref 135–145)
Total Bilirubin: 1.1 mg/dL (ref 0.2–1.2)
Total Protein: 6.6 g/dL (ref 6.0–8.3)

## 2015-12-31 LAB — HEMOGLOBIN A1C: HEMOGLOBIN A1C: 5.7 % (ref 4.6–6.5)

## 2015-12-31 MED ORDER — PANTOPRAZOLE SODIUM 40 MG PO TBEC: 40.0000 mg | DELAYED_RELEASE_TABLET | Freq: Every day | ORAL | Status: DC

## 2015-12-31 NOTE — Assessment & Plan Note (Signed)
HTN: Seems control, check a CMP, FLP, continue losartan Dyspepsia: 82 year old gentleman with dyspepsia, pain radiates from the abdomen to the chest, no exertional symptoms. Similar symptoms 2014, ETT normal.  Plan: EKG today unchanged from previous, check a CBC, US abdomen r/o gallbladder, keep appointment with cardiology, ER if severe, persistent or no postprandial sx; also  consistent use of Protonix every morning before breakfast. Mild hyperglycemia, A1c 5.8, recheck a A1c RTC 3 months

## 2015-12-31 NOTE — Progress Notes (Signed)
Pre visit review using our clinic review tool, if applicable. No additional management support is needed unless otherwise documented below in the visit note. 

## 2015-12-31 NOTE — Patient Instructions (Signed)
GO TO THE LAB : Get the blood work     GO TO THE FRONT DESK Schedule your next appointment for a  checkup in 3 months   Keep your appointment to see cardiology  Take pantoprazole 1 tablet every morning before breakfast every day  Okay Mylanta as needed  If you have severe or persistent symptoms --- call or go to the ER

## 2015-12-31 NOTE — Progress Notes (Signed)
Subjective:    Patient ID: Cory Zavala, male    DOB: 21-Aug-1930, 82 y.o.   MRN: [id number]  DOS:  12/31/2015 Type of visit - description : Routine checkup Interval history: Here for a routine checkup but his main concern is "indigestion". Stays on for the last 4 weeks is having mid abdominal tightness that radiates upward to the anterior chest, usually after eating. Symptoms are getting slightly more frequent, had intense episodes yesterday, lasted 30 minutes, it was immediately postprandial. When he is very active doing heavy yard work ,  he is asymptomatic.  "I feel like if I could burp I would be okay". He does not thinks is his heart however he already call cardiology and has an appointment pending  History of GERD, no frequent heartburn, takes PPIs  sporadically, has been more consistent taking PPIs lately. HTN: Good med compliance, ambulatory BPs within normal.   Review of Systems No difficulty breathing, lower extremity edema or palpitations No nausea, vomiting, diarrhea. No blood in the stools. No odynophagia but sometimes has mild dysphagia?.  Past Medical History  Diagnosis Date  . HTN (hypertension)   . Back pain, chronic   . GERD (gastroesophageal reflux disease) 09/15/2012  . BPH (benign prostatic hypertrophy)   . Diverticulosis of colon     LEFT  . ED (erectile dysfunction)   . Gross hematuria     2015  . Osteoporosis   . RBBB   . Normal cardiac stress test     PER CARDIOLOGIST NOTE, DR NISHAN, APPROX.  1994  . Nocturia     Past Surgical History  Procedure Laterality Date  . Vertebroplasty  2001  . Cataract extraction w/ intraocular lens  implant, bilateral  2010  . Colonoscopy  05-22-2003  . Exercise treadmill test  11-04-2012   DR Johnsie Cancel    NORMAL-  NO EVIDENCE OF ISCHEMIA BY ST ANALYSIS  . Cystoscopy with retrograde pyelogram, ureteroscopy and stent placement Right 12/25/2013    Procedure: CYSTOSCOPY WITH RIGHT RETROGRADE PYELOGRAM, RIGHT URETEROSCOPY   BIOPSY AND STENT: BLADDER BIOPSY;  Surgeon: Claybon Jabs, MD;  Location: [place];  Service: Urology;  Laterality: Right;    Social History   Social History  . Marital Status: Married    Spouse Name: N/A  . Number of Children: 2  . Years of Education: N/A   Occupational History  . pastor, semi retired     Social History Main Topics  . Smoking status: Never Smoker   . Smokeless tobacco: Never Used  . Alcohol Use: No  . Drug Use: No  . Sexual Activity: Not on file   Other Topics Concern  . Not on file   Social History Narrative   Retired, patient was a Company secretary still preachs, Educational psychologist   Married > 46 years, lost wife aprox 5-11. remarried              Medication List       This list is accurate as of: 12/31/15  7:06 PM.  Always use your most recent med list.               aspirin 81 MG tablet  Take 81 mg by mouth daily.     azelastine 0.1 % nasal spray  Commonly known as:  ASTELIN  Place 2 sprays into both nostrils at bedtime as needed for rhinitis. Use in each nostril as directed     EPINEPHrine 0.3 mg/0.3 mL Soaj injection  Commonly known  as:  EPI-PEN  INJECT 0.3MLS(CONTENTS OF 1 SYRINGE) INTO THE MUSCLE AS DIRECTED     losartan 50 MG tablet  Commonly known as:  COZAAR  Take 1 tablet (50 mg total) by mouth daily.     multivitamin tablet  Take 1 tablet by mouth daily.     OSTEO ADVANCE PO  Take 4 tablets by mouth daily. OSTEO GOLD (BRAND)     pantoprazole 40 MG tablet  Commonly known as:  PROTONIX  Take 1 tablet (40 mg total) by mouth daily.     vitamin B-12 500 MCG tablet  Commonly known as:  CYANOCOBALAMIN  Take 500 mcg by mouth daily.           Objective:   Physical Exam BP 118/72 mmHg  Pulse 51  Temp(Src) 98 F (36.7 C) (Oral)  Ht 5\' 10"  (1.778 m)  Wt 161 lb (73.029 kg)  BMI 23.10 kg/m2  SpO2 97%  General:   Well developed, well nourished . NAD.  HEENT:  Normocephalic . Face symmetric, atraumatic Lungs:    CTA B Normal respiratory effort, no intercostal retractions, no accessory muscle use. Heart: RRR,  no murmur.  No pretibial edema bilaterally  Abdomen:  Not distended, soft, non-tender. No rebound or rigidity.   Skin: Exposed areas without rash. Not pale. Not jaundice Neurologic:  alert & oriented X3.  Speech normal, gait appropriate for age and unassisted Strength symmetric and appropriate for age.  Psych: Cognition and judgment appear intact.  Cooperative with normal attention span and concentration.  Behavior appropriate. No anxious or depressed appearing.    Assessment & Plan:   Assessment > HTN GERD RBBB MSK: --Chronic back pain -- Vertebroplasty 2001 Osteoporosis T score -3.3 (2014), declined all meds GU: ---BPH ---ED ---Gross hematuria 2015,  Initial CT show a question of urothelial cancer, subsequently a ureteroscopy was done and reportedly normal, subsequently a biopsy was negative. Follow-up MRI 01-2014 show improvement. Saw urology ~ 04-2015 Urology check PSAs Exercise stress test 2014 negative  PLAN: HTN: Seems control, check a CMP, FLP, continue losartan Dyspepsia: 82 year old gentleman with dyspepsia, pain radiates from the abdomen to the chest, no exertional symptoms. Similar symptoms 2014, ETT normal.  Plan: EKG today unchanged from previous, check a CBC, US abdomen r/o gallbladder, keep appointment with cardiology, ER if severe, persistent or no postprandial sx; also  consistent use of Protonix every morning before breakfast. Mild hyperglycemia, A1c 5.8, recheck a A1c RTC 3 months

## 2016-01-02 ENCOUNTER — Ambulatory Visit (HOSPITAL_BASED_OUTPATIENT_CLINIC_OR_DEPARTMENT_OTHER)
Admission: RE | Admit: 2016-01-02 | Discharge: 2016-01-02 | Disposition: A | Discharge status: Home / Self Care | Payer: Medicare Other | Source: Ambulatory Visit | Attending: Internal Medicine | Admitting: Internal Medicine

## 2016-01-02 ENCOUNTER — Telehealth: Payer: Self-pay | Admitting: Internal Medicine

## 2016-01-02 DIAGNOSIS — K30 Functional dyspepsia: Secondary | ICD-10-CM | POA: Diagnosis not present

## 2016-01-02 DIAGNOSIS — N281 Cyst of kidney, acquired: Secondary | ICD-10-CM | POA: Insufficient documentation

## 2016-01-02 DIAGNOSIS — R1013 Epigastric pain: Secondary | ICD-10-CM | POA: Diagnosis not present

## 2016-01-02 NOTE — Telephone Encounter (Signed)
Results printed and mailed to Pt.  

## 2016-01-02 NOTE — Telephone Encounter (Signed)
Caller name: [name] Relation to pt: self Call back number: [phone] Pharmacy:  Reason for call: Pt requested lab results done on 12-31-15 to be mailed to home address. Please advise.

## 2016-01-09 ENCOUNTER — Telehealth: Payer: Self-pay

## 2016-01-09 NOTE — Telephone Encounter (Signed)
Patient about result please  Call [phone]

## 2016-01-09 NOTE — Telephone Encounter (Signed)
Notes Recorded by Damita Dunnings, CMA on 01/09/2016 at 11:00 AM EDT Spoke w/ Pt, informed him of results and recommendations. Pt declined seeing urology at this time but states he is feeling better in regards to Dyspepsia. ------  Notes Recorded by Ewing Schlein, CMA on 01/07/2016 at 10:04 AM EDT [facility] to call the office.  KP ------  Notes Recorded by Damita Dunnings, CMA on 01/03/2016 at 9:16 AM [facility] informing Pt to return call. ------  Notes Recorded by Ann Held, DO on 01/02/2016 at 10:17 PM No gallstones Mult stable renal cysts--- Forward to [facility] urology

## 2016-01-20 NOTE — Progress Notes (Signed)
Cardiology Office Note   Date:  01/21/2016   ID:  Cory Zavala, DOB 03-Aug-1930, MRN [id number]  PCP:  Kathlene November, MD  Cardiologist:   Jenkins Rouge, MD   No chief complaint on file.     History of Present Illness: Cory Zavala is a 82 y.o. male who presents for evaluation of HTN and RBBB. Last seen in 2014 for atypical chest pain and had a normal ETT Saw primary Dr Larose Kells 12/31/15 and complaied of dyspepsia. Pain in abdomen radiating to chest.  [place] of abdomen with no gallstones just renal cysts   LFTls normal   Started on protonix with improvement Pain was epigastric with spasms No vomiting or nausea. Normal BMls   Has not had any pains in weeks     Past Medical History:  Diagnosis Date  . Back pain, chronic   . BPH (benign prostatic hypertrophy)   . Diverticulosis of colon    LEFT  . ED (erectile dysfunction)   . GERD (gastroesophageal reflux disease) 09/15/2012  . Gross hematuria    2015  . HTN (hypertension)   . Nocturia   . Normal cardiac stress test    PER CARDIOLOGIST NOTE, DR Nafeesa Dils, APPROX.  1994  . Osteoporosis   . RBBB     Past Surgical History:  Procedure Laterality Date  . CATARACT EXTRACTION W/ INTRAOCULAR LENS  IMPLANT, BILATERAL  2010  . COLONOSCOPY  05-22-2003  . CYSTOSCOPY WITH RETROGRADE PYELOGRAM, URETEROSCOPY AND STENT PLACEMENT Right 12/25/2013   Procedure: CYSTOSCOPY WITH RIGHT RETROGRADE PYELOGRAM, RIGHT URETEROSCOPY  BIOPSY AND STENT: BLADDER BIOPSY;  Surgeon: Claybon Jabs, MD;  Location: [place];  Service: Urology;  Laterality: Right;  . EXERCISE TREADMILL TEST  11-04-2012   DR Johnsie Cancel   NORMAL-  NO EVIDENCE OF ISCHEMIA BY ST ANALYSIS  . VERTEBROPLASTY  2001     Current Outpatient Prescriptions  Medication Sig Dispense Refill  . aspirin 81 MG tablet Take 81 mg by mouth daily.    Marland Kitchen azelastine (ASTELIN) 0.1 % nasal spray Place 2 sprays into both nostrils at bedtime as needed for rhinitis. Use in each nostril as directed 30 mL  3  . EPINEPHRINE 0.3 mg/0.3 mL IJ SOAJ injection INJECT 0.3MLS(CONTENTS OF 1 SYRINGE) INTO THE MUSCLE AS DIRECTED 2 Device 2  . losartan (COZAAR) 50 MG tablet Take 1 tablet (50 mg total) by mouth daily. 90 tablet 1  . Multiple Vitamin (MULTIVITAMIN) tablet Take 1 tablet by mouth daily.    . Nutritional Supplements (OSTEO ADVANCE PO) Take 4 tablets by mouth daily. OSTEO GOLD (BRAND)    . pantoprazole (PROTONIX) 40 MG tablet Take 1 tablet (40 mg total) by mouth daily. 30 tablet 6  . vitamin B-12 (CYANOCOBALAMIN) 500 MCG tablet Take 500 mcg by mouth daily.     No current facility-administered medications for this visit.     Allergies:   Bee venom    Social History:  The patient  reports that he has never smoked. He has never used smokeless tobacco. He reports that he does not drink alcohol or use drugs.   Family History:  The patient's family history is not on file.    ROS:  Please see the history of present illness.   Otherwise, review of systems are positive for none.   All other systems are reviewed and negative.    PHYSICAL EXAM: VS:  BP (!) 180/96   Pulse 62   Ht 5\' 10"  (1.778 m)  Wt 164 lb 12.8 oz (74.8 kg)   BMI 23.65 kg/m  , BMI Body mass index is 23.65 kg/m. Affect appropriate Healthy:  appears stated age 60: normal Neck supple with no adenopathy JVP normal no bruits no thyromegaly Lungs clear with no wheezing and good diaphragmatic motion Heart:  S1/S2 no murmur, no rub, gallop or click PMI normal Abdomen: benighn, BS positve, no tenderness, no AAA no bruit.  No HSM or HJR Distal pulses intact with no bruits No edema Neuro non-focal Skin warm and dry No muscular weakness    EKG:  12/31/15  SR rate 50 RBBB no acute changes    Recent Labs: 12/31/2015: ALT 16; BUN 24; Creatinine, Ser 1.16; Hemoglobin 15.1; Platelets 185.0; Potassium 3.8; Sodium 142    Lipid Panel    Component Value Date/Time   CHOL 151 12/31/2015 1148   TRIG 59.0 12/31/2015 1148    TRIG 89 05/24/2006 1038   HDL 48.40 12/31/2015 1148   CHOLHDL 3 12/31/2015 1148   VLDL 11.8 12/31/2015 1148   LDLCALC 91 12/31/2015 1148      Wt Readings from Last 3 Encounters:  01/21/16 164 lb 12.8 oz (74.8 kg)  12/31/15 161 lb (73 kg)  09/30/15 166 lb (75.3 kg)      Other studies Reviewed: Additional studies/ records that were reviewed today include: Primary  Care notes Dr Larose Kells Cariology notes ETT 2013 .    ASSESSMENT AND PLAN:  1.  HTN:  Well controlled.  Continue current medications and low sodium Dash type diet.   2. RBBB: chronic no change yearly ECG no AV block  3. Dyspepsia improved with protonix [place] negative observe  4. Chest pain  Resolved related to reflux no further w/u at this time Normal ETT   Jenkins Rouge    Current medicines are reviewed at length with the patient today.  The patient does not have concerns regarding medicines.  The following changes have been made:  no change  Labs/ tests ordered today include: None  No orders of the defined types were placed in this encounter.    Disposition:   FU with me in a year      Signed, Jenkins Rouge, MD  01/21/2016 11:46 AM    [facility] [address], [place], [place]  [address] Phone: [phone]; Fax: [phone]

## 2016-01-21 ENCOUNTER — Ambulatory Visit (INDEPENDENT_AMBULATORY_CARE_PROVIDER_SITE_OTHER): Payer: Medicare Other | Admitting: Cardiovascular Disease

## 2016-01-21 ENCOUNTER — Encounter: Payer: Self-pay | Admitting: Cardiovascular Disease

## 2016-01-21 VITALS — BP 180/96 | HR 62 | Ht 70.0 in | Wt 164.8 lb

## 2016-01-21 DIAGNOSIS — R0789 Other chest pain: Secondary | ICD-10-CM

## 2016-01-21 NOTE — Patient Instructions (Addendum)

## 2016-03-18 DIAGNOSIS — D485 Neoplasm of uncertain behavior of skin: Secondary | ICD-10-CM | POA: Diagnosis not present

## 2016-03-18 DIAGNOSIS — L570 Actinic keratosis: Secondary | ICD-10-CM | POA: Diagnosis not present

## 2016-03-18 DIAGNOSIS — L812 Freckles: Secondary | ICD-10-CM | POA: Diagnosis not present

## 2016-03-18 DIAGNOSIS — L821 Other seborrheic keratosis: Secondary | ICD-10-CM | POA: Diagnosis not present

## 2016-04-21 DIAGNOSIS — Z23 Encounter for immunization: Secondary | ICD-10-CM | POA: Diagnosis not present

## 2016-05-04 DIAGNOSIS — R3915 Urgency of urination: Secondary | ICD-10-CM | POA: Diagnosis not present

## 2016-05-04 DIAGNOSIS — N401 Enlarged prostate with lower urinary tract symptoms: Secondary | ICD-10-CM | POA: Diagnosis not present

## 2016-05-20 ENCOUNTER — Telehealth: Payer: Self-pay | Admitting: Internal Medicine

## 2016-05-20 NOTE — Telephone Encounter (Addendum)
08/07/14 was last PR PPPS, SUBSEQ VISIT [id number] lvm advising patient to schedule medicare wellness

## 2016-05-27 DIAGNOSIS — H353131 Nonexudative age-related macular degeneration, bilateral, early dry stage: Secondary | ICD-10-CM | POA: Diagnosis not present

## 2016-05-28 ENCOUNTER — Other Ambulatory Visit: Payer: Self-pay | Admitting: Internal Medicine

## 2016-06-01 NOTE — Progress Notes (Addendum)
Subjective:   DEMONT Zavala is a 82 y.o. male who presents for Medicare Annual/Subsequent preventive examination.  Review of Systems:  No ROS.  Medicare Wellness Visit.  Cardiac Risk Factors include: advanced age (>67men, >95 women);hypertension;male gender Sleep patterns: Wakes up 1-2 times each night to urinate. Sleeps at least 8 hrs. Feels rested. Home Safety/Smoke Alarms:  Smoke detectors and security alarm in place. Living environment; residence and Firearm Safety: Lives at home with wife in 2 stories. Firearms properly stored. Feels safe. Seat Belt Safety/Bike Helmet: wears seatbelt.   Counseling:   Eye Exam- Follows with Dr.Mincey annually for reading glasses.  Dental- Follows with Dr. Eula Listen annually.  Male:   CCS- Pt states he had colonoscopy 3 yrs ago. Reports normal but can't remember name of where procedure was performed.  PSA-   Lab Results  Component Value Date   PSA 3.37 09/15/2012   PSA 2.80 09/01/2011   PSA 4.03 (H) 06/18/2010        Objective:    Vitals: BP 140/78 (BP Location: Right Arm, Patient Position: Sitting, Cuff Size: Normal)   Pulse 60   Ht 5\' 10"  (1.778 m)   Wt 165 lb 3.2 oz (74.9 kg)   SpO2 98%   BMI 23.70 kg/m   Body mass index is 23.7 kg/m.  Tobacco History  Smoking Status  . Never Smoker  Smokeless Tobacco  . Never Used     Counseling given: Not Answered   Past Medical History:  Diagnosis Date  . Back pain, chronic   . BPH (benign prostatic hypertrophy)   . Diverticulosis of colon    LEFT  . ED (erectile dysfunction)   . GERD (gastroesophageal reflux disease) 09/15/2012  . Gross hematuria    2015  . HTN (hypertension)   . Nocturia   . Normal cardiac stress test    PER CARDIOLOGIST NOTE, DR NISHAN, APPROX.  1994  . Osteoporosis   . RBBB    Past Surgical History:  Procedure Laterality Date  . CATARACT EXTRACTION W/ INTRAOCULAR LENS  IMPLANT, BILATERAL  2010  . COLONOSCOPY  05-22-2003  . CYSTOSCOPY WITH  RETROGRADE PYELOGRAM, URETEROSCOPY AND STENT PLACEMENT Right 12/25/2013   Procedure: CYSTOSCOPY WITH RIGHT RETROGRADE PYELOGRAM, RIGHT URETEROSCOPY  BIOPSY AND STENT: BLADDER BIOPSY;  Surgeon: Claybon Jabs, MD;  Location: [place];  Service: Urology;  Laterality: Right;  . EXERCISE TREADMILL TEST  11-04-2012   DR Johnsie Cancel   NORMAL-  NO EVIDENCE OF ISCHEMIA BY ST ANALYSIS  . VERTEBROPLASTY  2001   Family History  Problem Relation Age of Onset  . CAD Neg Hx   . Diabetes Neg Hx   . Colon cancer Neg Hx   . Prostate cancer Neg Hx    History  Sexual Activity  . Sexual activity: Not on file    Outpatient Encounter Prescriptions as of 06/02/2016  Medication Sig  . aspirin 81 MG tablet Take 81 mg by mouth daily.  Marland Kitchen EPINEPHRINE 0.3 mg/0.3 mL IJ SOAJ injection INJECT 0.3MLS(CONTENTS OF 1 SYRINGE) INTO THE MUSCLE AS DIRECTED  . losartan (COZAAR) 50 MG tablet Take 1 tablet (50 mg total) by mouth daily.  . Multiple Vitamin (MULTIVITAMIN) tablet Take 1 tablet by mouth daily.  . Nutritional Supplements (OSTEO ADVANCE PO) Take 4 tablets by mouth daily. OSTEO GOLD (BRAND)  . pantoprazole (PROTONIX) 40 MG tablet Take 1 tablet (40 mg total) by mouth daily.  . vitamin B-12 (CYANOCOBALAMIN) 500 MCG tablet Take 500 mcg  by mouth daily.  . [DISCONTINUED] azelastine (ASTELIN) 0.1 % nasal spray Place 2 sprays into both nostrils at bedtime as needed for rhinitis. Use in each nostril as directed   No facility-administered encounter medications on file as of 06/02/2016.     Activities of Daily Living In your present state of health, do you have any difficulty performing the following activities: 06/02/2016 09/30/2015  Hearing? N N  Vision? N N  Difficulty concentrating or making decisions? N N  Walking or climbing stairs? N N  Dressing or bathing? N N  Doing errands, shopping? N N  Preparing Food and eating ? N -  Using the Toilet? N -  In the past six months, have you accidently leaked  urine? N -  Do you have problems with loss of bowel control? N -  Managing your Medications? N -  Managing your Finances? N -  Housekeeping or managing your Housekeeping? N -  Some recent data might be hidden    Patient Care Team: Colon Branch, MD as PCP - General Kathie Rhodes, MD as Consulting Physician (Urology) Josue Hector, MD as Consulting Physician (Cardiology) Liliane Shi, PA-C as Physician Assistant (Cardiology)   Assessment:    Physical assessment deferred to PCP.  Exercise Activities and Dietary recommendations Current Exercise Habits: Home exercise routine, Type of exercise: walking, Time (Minutes): 20, Frequency (Times/Week): 7, Weekly Exercise (Minutes/Week): 140, Intensity: Mild   Diet : in general, a "healthy" diet  , well balanced, on average, 3 meals per day Breakfast: toast,coffee/ oatmeal and eggs Lunch: left overs Dinner: lots of vegetable and maybe a meat Water: maybe 2-3 glasses. Pt states he should drink more water.  Goals    . Increase water intake    . Maintain healthy lifestyle      Fall Risk Fall Risk  06/02/2016 09/30/2015 08/07/2014 09/15/2012  Falls in the past year? No No No No   Depression Screen PHQ 2/9 Scores 06/02/2016 09/30/2015 08/07/2014 09/15/2012  PHQ - 2 Score 0 0 0 0    Cognitive Function MMSE - Mini Mental State Exam 06/02/2016  Orientation to time 5  Orientation to Place 5  Registration 3  Attention/ Calculation 5  Recall 3  Language- name 2 objects 2  Language- repeat 1  Language- follow 3 step command 3  Language- read & follow direction 1  Write a sentence 1  Copy design 1  Total score 30        Immunization History  Administered Date(s) Administered  . Influenza Split 03/18/2011, 03/17/2012  . Influenza Whole 05/24/2007, 05/02/2008, 03/15/2009, 03/14/2010  . Influenza, High Dose Seasonal PF 03/20/2013  . Influenza,inj,Quad PF,36+ Mos 03/09/2014  . Influenza-Unspecified 03/11/2015  . Pneumococcal Conjugate-13  08/07/2014  . Pneumococcal Polysaccharide-23 06/15/1996, 05/20/2009  . Td 09/18/1999  . Tdap 09/01/2011  . Zoster 05/24/2009   Screening Tests Health Maintenance  Topic Date Due  . INFLUENZA VACCINE  01/14/2016  . TETANUS/TDAP  08/31/2021  . ZOSTAVAX  Completed  . PNA vac Low Risk Adult  Completed      Plan:     Bring a copy of your advance directives to your next office visit. Continue to eat heart healthy diet (full of fruits, vegetables, whole grains, lean protein, water--limit salt, fat, and sugar intake) and increase physical activity as tolerated.  During the course of the visit the patient was educated and counseled about the following appropriate screening and preventive services:   Vaccines to include Pneumoccal, Influenza, Hepatitis  B, Td, Zostavax, HCV  Cardiovascular Disease  Colorectal cancer screening  Diabetes screening  Prostate Cancer Screening  Glaucoma screening  Nutrition counseling    Patient Instructions (the written plan) was given to the patient.    Cory Zavala, [place]  06/02/2016  Kathlene November, MD

## 2016-06-01 NOTE — Progress Notes (Signed)
Pre visit review using our clinic review tool, if applicable. No additional management support is needed unless otherwise documented below in the visit note. 

## 2016-06-02 ENCOUNTER — Ambulatory Visit (INDEPENDENT_AMBULATORY_CARE_PROVIDER_SITE_OTHER): Payer: Medicare Other | Admitting: *Deleted

## 2016-06-02 ENCOUNTER — Encounter: Payer: Self-pay | Admitting: *Deleted

## 2016-06-02 VITALS — BP 140/78 | HR 60 | Ht 70.0 in | Wt 165.2 lb

## 2016-06-02 DIAGNOSIS — Z0000 Encounter for general adult medical examination without abnormal findings: Secondary | ICD-10-CM

## 2016-06-02 NOTE — Patient Instructions (Signed)
Bring a copy of your advance directives to your next office visit. Continue to eat heart healthy diet (full of fruits, vegetables, whole grains, lean protein, water--limit salt, fat, and sugar intake) and increase physical activity as tolerated.

## 2016-08-10 ENCOUNTER — Other Ambulatory Visit: Payer: Self-pay | Admitting: Internal Medicine

## 2016-12-06 ENCOUNTER — Other Ambulatory Visit: Payer: Self-pay | Admitting: Internal Medicine

## 2016-12-07 ENCOUNTER — Other Ambulatory Visit: Payer: Self-pay | Admitting: Internal Medicine

## 2017-01-11 ENCOUNTER — Other Ambulatory Visit: Payer: Self-pay | Admitting: Internal Medicine

## 2017-01-14 ENCOUNTER — Telehealth: Payer: Self-pay | Admitting: Internal Medicine

## 2017-01-14 ENCOUNTER — Ambulatory Visit (INDEPENDENT_AMBULATORY_CARE_PROVIDER_SITE_OTHER): Payer: Medicare Other | Admitting: Internal Medicine

## 2017-01-14 ENCOUNTER — Encounter: Payer: Self-pay | Admitting: Internal Medicine

## 2017-01-14 VITALS — BP 134/78 | HR 71 | Temp 98.1°F | Resp 14 | Ht 70.0 in | Wt 163.4 lb

## 2017-01-14 DIAGNOSIS — I10 Essential (primary) hypertension: Secondary | ICD-10-CM | POA: Diagnosis not present

## 2017-01-14 LAB — BASIC METABOLIC PANEL
BUN: 20 mg/dL (ref 6–23)
CO2: 27 meq/L (ref 19–32)
Calcium: 9.1 mg/dL (ref 8.4–10.5)
Chloride: 108 mEq/L (ref 96–112)
Creatinine, Ser: 1.2 mg/dL (ref 0.40–1.50)
GFR: 61.22 mL/min (ref >60.00)
Glucose, Bld: 127 mg/dL — ABNORMAL HIGH (ref 70–99)
POTASSIUM: 3.7 meq/L (ref 3.5–5.1)
Sodium: 143 mEq/L (ref 135–145)

## 2017-01-14 LAB — CBC WITH DIFFERENTIAL/PLATELET
Basophils Absolute: 0 10*3/uL (ref 0.0–0.1)
Basophils Relative: 0.4 % (ref 0.0–3.0)
EOS PCT: 3.8 % (ref 0.0–5.0)
Eosinophils Absolute: 0.2 10*3/uL (ref 0.0–0.7)
HCT: 44.9 % (ref 39.0–52.0)
Hemoglobin: 15 g/dL (ref 13.0–17.0)
LYMPHS ABS: 1.8 10*3/uL (ref 0.7–4.0)
Lymphocytes Relative: 31.1 % (ref 12.0–46.0)
MCHC: 33.4 g/dL (ref 30.0–36.0)
MCV: 96.6 fl (ref 78.0–100.0)
MONOS PCT: 9.4 % (ref 3.0–12.0)
Monocytes Absolute: 0.6 10*3/uL (ref 0.1–1.0)
NEUTROS ABS: 3.3 10*3/uL (ref 1.4–7.7)
Neutrophils Relative %: 55.3 % (ref 43.0–77.0)
PLATELETS: 176 10*3/uL (ref 150.0–400.0)
RBC: 4.65 Mil/uL (ref 4.22–5.81)
RDW: 12.9 % (ref 11.5–15.5)
WBC: 5.9 10*3/uL (ref 4.0–10.5)

## 2017-01-14 LAB — TSH: TSH: 1.24 u[IU]/mL (ref 0.35–4.50)

## 2017-01-14 MED ORDER — PANTOPRAZOLE SODIUM 40 MG PO TBEC
40.0000 mg | DELAYED_RELEASE_TABLET | Freq: Every day | ORAL | 2 refills | Status: DC
Start: 2017-01-14 — End: 2017-11-22

## 2017-01-14 MED ORDER — LOSARTAN POTASSIUM 50 MG PO TABS: 50.0000 mg | ORAL_TABLET | Freq: Every day | ORAL | 2 refills | Status: DC

## 2017-01-14 NOTE — Progress Notes (Signed)
Subjective:    Patient ID: Cory Zavala, male    DOB: [birth year], 83 y.o.   MRN: [id number]  DOS:  01/14/2017 Type of visit - description : rov Interval history: HTN: Good compliance with losartan, needs a refill. Ambulatory BPs:  Patient not completely sure about readings, thinks they are in the 130s.  Review of Systems Denies chest pain or difficulty breathing No heartburn No dysuria or gross hematuria Complain of dyspepsia last year, symptoms resolve  Past Medical History:  Diagnosis Date  . Back pain, chronic   . BPH (benign prostatic hypertrophy)   . Diverticulosis of colon    LEFT  . ED (erectile dysfunction)   . GERD (gastroesophageal reflux disease) 09/15/2012  . Gross hematuria    2015  . HTN (hypertension)   . Nocturia   . Normal cardiac stress test    PER CARDIOLOGIST NOTE, DR NISHAN, APPROX.  1994  . Osteoporosis   . RBBB     Past Surgical History:  Procedure Laterality Date  . CATARACT EXTRACTION W/ INTRAOCULAR LENS  IMPLANT, BILATERAL  2010  . CYSTOSCOPY WITH RETROGRADE PYELOGRAM, URETEROSCOPY AND STENT PLACEMENT Right 12/25/2013   Procedure: CYSTOSCOPY WITH RIGHT RETROGRADE PYELOGRAM, RIGHT URETEROSCOPY  BIOPSY AND STENT: BLADDER BIOPSY;  Surgeon: Claybon Jabs, MD;  Location: [place];  Service: Urology;  Laterality: Right;  Marland Kitchen VERTEBROPLASTY  2001    Social History   Social History  . Marital status: Married    Spouse name: N/A  . Number of children: 2  . Years of education: N/A   Occupational History  . pastor, semi retired  Retired   Social History Main Topics  . Smoking status: Never Smoker  . Smokeless tobacco: Never Used  . Alcohol use No  . Drug use: No  . Sexual activity: Not on file   Other Topics Concern  . Not on file   Social History Narrative   Retired, patient was a Company secretary still preachs, Educational psychologist   Married > 55 years, lost wife aprox 5-11. remarried            Allergies as of 01/14/2017    Reactions   Bee Venom Anaphylaxis      Medication List       Accurate as of 01/14/17  8:31 PM. Always use your most recent med list.          aspirin 81 MG tablet Take 81 mg by mouth daily.   EPINEPHrine 0.3 mg/0.3 mL Soaj injection Commonly known as:  EPI-PEN INJECT 0.3MLS(CONTENTS OF 1 SYRINGE) INTO THE MUSCLE AS DIRECTED   losartan 50 MG tablet Commonly known as:  COZAAR Take 1 tablet (50 mg total) by mouth daily.   multivitamin tablet Take 1 tablet by mouth daily.   OMEGA-3 FATTY ACIDS PO Take 1 tablet by mouth daily.   OSTEO ADVANCE PO Take 4 tablets by mouth daily. OSTEO GOLD (BRAND)   pantoprazole 40 MG tablet Commonly known as:  PROTONIX Take 1 tablet (40 mg total) by mouth daily.   PRESERVISION AREDS Caps Take 1 capsule by mouth daily.   vitamin B-12 500 MCG tablet Commonly known as:  CYANOCOBALAMIN Take 500 mcg by mouth daily.          Objective:   Physical Exam BP 134/78 (BP Location: Left Arm, Patient Position: Sitting, Cuff Size: Small)   Pulse 71   Temp 98.1 F (36.7 C) (Oral)   Resp 14   Ht 5\' 10"  (1.778 m)  Wt 163 lb 6 oz (74.1 kg)   SpO2 98%   BMI 23.44 kg/m  General:   Well developed, well nourished . NAD.  HEENT:  Normocephalic . Face symmetric, atraumatic Lungs:  CTA B Normal respiratory effort, no intercostal retractions, no accessory muscle use. Heart: RRR,  no murmur.  No pretibial edema bilaterally  Skin: Not pale. Not jaundice Neurologic:  alert & oriented X3.  Speech normal, gait appropriate for age and unassisted Psych--  Cognition and judgment appear intact.  Cooperative with normal attention span and concentration.  Behavior appropriate. No anxious or depressed appearing.      Assessment & Plan:   Assessment > HTN GERD RBBB MSK: --Chronic back pain -- Vertebroplasty 2001 Osteoporosis T score -3.3 (2014), declined all meds GU: ---BPH ---ED ---Gross hematuria 2015  Initial CT show a question of  urothelial cancer, subsequently a ureteroscopy was done and reportedly normal, subsequently a biopsy was negative. Follow-up MRI 01-2014 show improvement. Saw urology ~ 04-2015 Urology check PSAs Exercise stress test 2014 negative  PLAN: HTN: On losartan 50 mg daily, BP today is very good, check a BMP, CBC, TSH Dyspepsia: Had GI sxs last year, they are largely resolved. Primary care: Recommend to have a Medicare wellness in few months and have a flu shot this season. RTC 6 months. Fasting.

## 2017-01-14 NOTE — Telephone Encounter (Signed)
Pt would like to have his labs mailed to him when the results come back

## 2017-01-14 NOTE — Assessment & Plan Note (Signed)
HTN: On losartan 50 mg daily, BP today is very good, check a BMP, CBC, TSH Dyspepsia: Had GI sxs last year, they are largely resolved. Primary care: Recommend to have a Medicare wellness in few months and have a flu shot this season. RTC 6 months. Fasting.

## 2017-01-14 NOTE — Patient Instructions (Signed)
GO TO THE LAB : Get the blood work     GO TO THE FRONT DESK  Schedule your next appointment for a  Medicare wellness with one of our nurses for  December 2018  Next visit with me in 6 months, fasting.  Don't forget to get  a flu shot this season

## 2017-01-14 NOTE — Progress Notes (Signed)
Pre visit review using our clinic review tool, if applicable. No additional management support is needed unless otherwise documented below in the visit note. 

## 2017-01-14 NOTE — Telephone Encounter (Signed)
Noted  

## 2017-01-15 DIAGNOSIS — Z961 Presence of intraocular lens: Secondary | ICD-10-CM | POA: Diagnosis not present

## 2017-01-15 DIAGNOSIS — H43813 Vitreous degeneration, bilateral: Secondary | ICD-10-CM | POA: Diagnosis not present

## 2017-01-15 DIAGNOSIS — H04123 Dry eye syndrome of bilateral lacrimal glands: Secondary | ICD-10-CM | POA: Diagnosis not present

## 2017-01-15 DIAGNOSIS — H02831 Dermatochalasis of right upper eyelid: Secondary | ICD-10-CM | POA: Diagnosis not present

## 2017-01-15 DIAGNOSIS — H353131 Nonexudative age-related macular degeneration, bilateral, early dry stage: Secondary | ICD-10-CM | POA: Diagnosis not present

## 2017-01-20 NOTE — Telephone Encounter (Signed)
Results mailed to Pt as requested.

## 2017-03-09 DIAGNOSIS — Z23 Encounter for immunization: Secondary | ICD-10-CM | POA: Diagnosis not present

## 2017-05-11 DIAGNOSIS — R3915 Urgency of urination: Secondary | ICD-10-CM | POA: Diagnosis not present

## 2017-05-11 DIAGNOSIS — N401 Enlarged prostate with lower urinary tract symptoms: Secondary | ICD-10-CM | POA: Diagnosis not present

## 2017-05-27 ENCOUNTER — Ambulatory Visit: Payer: Medicare Other | Admitting: *Deleted

## 2017-06-17 NOTE — Progress Notes (Addendum)
Subjective:   Cory Zavala is a 84 y.o. male who presents for Medicare Annual/Subsequent preventive examination. 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  Review of Systems:  No ROS.  Medicare Wellness Visit. Additional risk factors are reflected in the social history.   Sleep patterns: wakes 2-3x/night to urinate. Sleeps 6-7 hrs per night. Feels rested. Home Safety/Smoke Alarms: Feels safe in home. Smoke alarms in place. Lives with wife in 2 story home.   Male:   CCS- No longer doing routine screening due to age.     PSA-  Lab Results  Component Value Date   PSA 3.37 09/15/2012   PSA 2.80 09/01/2011   PSA 4.03 (H) 06/18/2010       Objective:    Vitals: There were no vitals taken for this visit.  There is no height or weight on file to calculate BMI.  Advanced Directives 06/25/2017 06/02/2016 12/25/2013  Does Patient Have a Medical Advance Directive? No No Patient has advance directive, copy not in chart  Type of Advance Directive - - Living will  Does patient want to make changes to medical advance directive? - - No change requested  Would patient like information on creating a medical advance directive? No - Patient declined Yes (MAU/Ambulatory/Procedural Areas - Information given) -    Tobacco Social History   Tobacco Use  Smoking Status Never Smoker  Smokeless Tobacco Never Used     Counseling given: Not Answered   Clinical Intake: Pain : No/denies pain   Past Medical History:  Diagnosis Date  . Back pain, chronic   . BPH (benign prostatic hypertrophy)   . Diverticulosis of colon    LEFT  . ED (erectile dysfunction)   . GERD (gastroesophageal reflux disease) 09/15/2012  . Gross hematuria    2015  . HTN (hypertension)   . Nocturia   . Normal cardiac stress test    PER CARDIOLOGIST NOTE, DR NISHAN, APPROX.  1994  . Osteoporosis   . RBBB    Past Surgical History:  Procedure Laterality Date  . CATARACT EXTRACTION W/ INTRAOCULAR LENS  IMPLANT, BILATERAL  2010  .  CYSTOSCOPY WITH RETROGRADE PYELOGRAM, URETEROSCOPY AND STENT PLACEMENT Right 12/25/2013   Procedure: CYSTOSCOPY WITH RIGHT RETROGRADE PYELOGRAM, RIGHT URETEROSCOPY  BIOPSY AND STENT: BLADDER BIOPSY;  Surgeon: Claybon Jabs, MD;  Location: [facility];  Service: Urology;  Laterality: Right;  Marland Kitchen VERTEBROPLASTY  2001   Family History  Problem Relation Age of Onset  . CAD Neg Hx   . Diabetes Neg Hx   . Colon cancer Neg Hx   . Prostate cancer Neg Hx    Social History   Socioeconomic History  . Marital status: Married    Spouse name: None  . Number of children: 2  . Years of education: None  . Highest education level: None  Social Needs  . Financial resource strain: None  . Food insecurity - worry: None  . Food insecurity - inability: None  . Transportation needs - medical: None  . Transportation needs - non-medical: None  Occupational History  . Occupation: Theme park manager, semi retired     Fish farm manager: RETIRED  Tobacco Use  . Smoking status: Never Smoker  . Smokeless tobacco: Never Used  Substance and Sexual Activity  . Alcohol use: No  . Drug use: No  . Sexual activity: None  Other Topics Concern  . None  Social History Narrative   Retired, patient was a Chief Executive Officer, Educational psychologist   Married > 50  years, lost wife aprox 5-11. remarried          Outpatient Encounter Medications as of 06/25/2017  Medication Sig  . aspirin 81 MG tablet Take 81 mg by mouth daily.  . Cholecalciferol (D3 ADULT PO) Take by mouth.  . EPINEPHRINE 0.3 mg/0.3 mL IJ SOAJ injection INJECT 0.3MLS(CONTENTS OF 1 SYRINGE) INTO THE MUSCLE AS DIRECTED  . losartan (COZAAR) 50 MG tablet Take 1 tablet (50 mg total) by mouth daily.  . Multiple Vitamin (MULTIVITAMIN) tablet Take 1 tablet by mouth daily.  . Multiple Vitamins-Minerals (PRESERVISION AREDS) CAPS Take 1 capsule by mouth daily.  . Nutritional Supplements (OSTEO ADVANCE PO) Take 4 tablets by mouth daily. OSTEO GOLD (BRAND)  . OMEGA-3 FATTY  ACIDS PO Take 1 tablet by mouth daily.  . pantoprazole (PROTONIX) 40 MG tablet Take 1 tablet (40 mg total) by mouth daily.  . vitamin B-12 (CYANOCOBALAMIN) 500 MCG tablet Take 500 mcg by mouth daily.   No facility-administered encounter medications on file as of 06/25/2017.     Activities of Daily Living In your present state of health, do you have any difficulty performing the following activities: 06/25/2017  Hearing? N  Vision? N  Comment wears glasses for reading. hx cataract sx. Eye doctor every 6 months.  Difficulty concentrating or making decisions? N  Walking or climbing stairs? N  Dressing or bathing? N  Doing errands, shopping? N  Preparing Food and eating ? N  Using the Toilet? N  In the past six months, have you accidently leaked urine? N  Do you have problems with loss of bowel control? N  Managing your Medications? N  Managing your Finances? N  Housekeeping or managing your Housekeeping? N  Some recent data might be hidden    Patient Care Team: Colon Branch, MD as PCP - General Kathie Rhodes, MD as Consulting Physician (Urology) Josue Hector, MD as Consulting Physician (Cardiology) Sharmon Revere as Physician Assistant (Cardiology)   Assessment:   This is a routine wellness examination for Cory Zavala. Physical assessment deferred to PCP.  Exercise Activities and Dietary recommendations Current Exercise Habits: Home exercise routine, Type of exercise: walking, Time (Minutes): 20, Frequency (Times/Week): 7, Weekly Exercise (Minutes/Week): 140, Exercise limited by: None identified Diet (meal preparation, eat out, water intake, caffeinated beverages, dairy products, fruits and vegetables): in general, a "healthy" diet     Goals    . Maintain healthy lifestyle (pt-stated)     Continue to eat heart healthy diet (full of fruits, vegetables, whole grains, lean protein, water--limit salt, fat, and sugar intake) and increase physical activity as tolerated. Continue  doing brain stimulating activities (puzzles, reading, adult coloring books, staying active) to keep memory sharp.         Fall Risk Fall Risk  06/25/2017 06/02/2016 09/30/2015 08/07/2014 09/15/2012  Falls in the past year? No No No No No    Depression Screen PHQ 2/9 Scores 06/25/2017 06/02/2016 09/30/2015 08/07/2014  PHQ - 2 Score 0 0 0 0    Cognitive Function MMSE - Mini Mental State Exam 06/25/2017 06/02/2016  Orientation to time 5 5  Orientation to Place 5 5  Registration 3 3  Attention/ Calculation 5 5  Recall 3 3  Language- name 2 objects 2 2  Language- repeat 1 1  Language- follow 3 step command 3 3  Language- read & follow direction 1 1  Write a sentence 1 1  Copy design 1 1  Total score 30 30  Immunization History  Administered Date(s) Administered  . Influenza Split 03/18/2011, 03/17/2012  . Influenza Whole 05/24/2007, 05/02/2008, 03/15/2009, 03/14/2010  . Influenza, High Dose Seasonal PF 03/20/2013  . Influenza,inj,Quad PF,6+ Mos 03/09/2014  . Influenza-Unspecified 03/11/2015  . Pneumococcal Conjugate-13 08/07/2014  . Pneumococcal Polysaccharide-23 06/15/1996, 05/20/2009  . Td 09/18/1999  . Tdap 09/01/2011  . Zoster 05/24/2009   Screening Tests Health Maintenance  Topic Date Due  . INFLUENZA VACCINE  01/13/2017  . TETANUS/TDAP  08/31/2021  . PNA vac Low Risk Adult  Completed       Plan:   Follow up with Dr.Paz 07/20/17.  Continue to eat heart healthy diet (full of fruits, vegetables, whole grains, lean protein, water--limit salt, fat, and sugar intake) and increase physical activity as tolerated.  Continue doing brain stimulating activities (puzzles, reading, adult coloring books, staying active) to keep memory sharp.   Bring a copy of your living will and/or healthcare power of attorney to your next office visit.   I have personally reviewed and noted the following in the patient's chart:   . Medical and social history . Use of alcohol,  tobacco or illicit drugs  . Current medications and supplements . Functional ability and status . Nutritional status . Physical activity . Advanced directives . List of other physicians . Hospitalizations, surgeries, and ER visits in previous 12 months . Vitals . Screenings to include cognitive, depression, and falls . Referrals and appointments  In addition, I have reviewed and discussed with patient certain preventive protocols, quality metrics, and best practice recommendations. A written personalized care plan for preventive services as well as general preventive health recommendations were provided to patient.     Cory Zavala, [place]  06/25/2017 Kathlene November, MD

## 2017-06-25 ENCOUNTER — Encounter: Payer: Self-pay | Admitting: *Deleted

## 2017-06-25 ENCOUNTER — Ambulatory Visit (INDEPENDENT_AMBULATORY_CARE_PROVIDER_SITE_OTHER): Payer: Medicare Other | Admitting: *Deleted

## 2017-06-25 VITALS — BP 138/72 | HR 63 | Wt 165.4 lb

## 2017-06-25 DIAGNOSIS — Z0000 Encounter for general adult medical examination without abnormal findings: Secondary | ICD-10-CM | POA: Diagnosis not present

## 2017-06-25 NOTE — Patient Instructions (Signed)
Follow up with Dr.Paz 07/20/17.  Continue to eat heart healthy diet (full of fruits, vegetables, whole grains, lean protein, water--limit salt, fat, and sugar intake) and increase physical activity as tolerated.  Continue doing brain stimulating activities (puzzles, reading, adult coloring books, staying active) to keep memory sharp.   Bring a copy of your living will and/or healthcare power of attorney to your next office visit.   Cory Zavala , Thank you for taking time to come for your Medicare Wellness Visit. I appreciate your ongoing commitment to your health goals. Please review the following plan we discussed and let me know if I can assist you in the future.   These are the goals we discussed: Goals    . Maintain healthy lifestyle (pt-stated)     Continue to eat heart healthy diet (full of fruits, vegetables, whole grains, lean protein, water--limit salt, fat, and sugar intake) and increase physical activity as tolerated. Continue doing brain stimulating activities (puzzles, reading, adult coloring books, staying active) to keep memory sharp.         This is a list of the screening recommended for you and due dates:  Health Maintenance  Topic Date Due  . Flu Shot  01/13/2017  . Tetanus Vaccine  08/31/2021  . Pneumonia vaccines  Completed    Health Maintenance, Male A healthy lifestyle and preventive care is important for your health and wellness. Ask your health care provider about what schedule of regular examinations is right for you. What should I know about weight and diet? Eat a Healthy Diet  Eat plenty of vegetables, fruits, whole grains, low-fat dairy products, and lean protein.  Do not eat a lot of foods high in solid fats, added sugars, or salt.  Maintain a Healthy Weight Regular exercise can help you achieve or maintain a healthy weight. You should:  Do at least 150 minutes of exercise each week. The exercise should increase your heart rate and make you sweat  (moderate-intensity exercise).  Do strength-training exercises at least twice a week.  Watch Your Levels of Cholesterol and Blood Lipids  Have your blood tested for lipids and cholesterol every 5 years starting at 82 years of age. If you are at high risk for heart disease, you should start having your blood tested when you are 82 years old. You may need to have your cholesterol levels checked more often if: ? Your lipid or cholesterol levels are high. ? You are older than 82 years of age. ? You are at high risk for heart disease.  What should I know about cancer screening? Many types of cancers can be detected early and may often be prevented. Lung Cancer  You should be screened every year for lung cancer if: ? You are a current smoker who has smoked for at least 30 years. ? You are a former smoker who has quit within the past 15 years.  Talk to your health care provider about your screening options, when you should start screening, and how often you should be screened.  Colorectal Cancer  Routine colorectal cancer screening usually begins at 82 years of age and should be repeated every 5-10 years until you are 82 years old. You may need to be screened more often if early forms of precancerous polyps or small growths are found. Your health care provider may recommend screening at an earlier age if you have risk factors for colon cancer.  Your health care provider may recommend using home test kits to check  for hidden blood in the stool.  A small camera at the end of a tube can be used to examine your colon (sigmoidoscopy or colonoscopy). This checks for the earliest forms of colorectal cancer.  Prostate and Testicular Cancer  Depending on your age and overall health, your health care provider may do certain tests to screen for prostate and testicular cancer.  Talk to your health care provider about any symptoms or concerns you have about testicular or prostate cancer.  Skin  Cancer  Check your skin from head to toe regularly.  Tell your health care provider about any new moles or changes in moles, especially if: ? There is a change in a mole's size, shape, or color. ? You have a mole that is larger than a pencil eraser.  Always use sunscreen. Apply sunscreen liberally and repeat throughout the day.  Protect yourself by wearing long sleeves, pants, a wide-brimmed hat, and sunglasses when outside.  What should I know about heart disease, diabetes, and high blood pressure?  If you are 25-72 years of age, have your blood pressure checked every 3-5 years. If you are 22 years of age or older, have your blood pressure checked every year. You should have your blood pressure measured twice-once when you are at a hospital or clinic, and once when you are not at a hospital or clinic. Record the average of the two measurements. To check your blood pressure when you are not at a hospital or clinic, you can use: ? An automated blood pressure machine at a pharmacy. ? A home blood pressure monitor.  Talk to your health care provider about your target blood pressure.  If you are between 33-26 years old, ask your health care provider if you should take aspirin to prevent heart disease.  Have regular diabetes screenings by checking your fasting blood sugar level. ? If you are at a normal weight and have a low risk for diabetes, have this test once every three years after the age of 71. ? If you are overweight and have a high risk for diabetes, consider being tested at a younger age or more often.  A one-time screening for abdominal aortic aneurysm (AAA) by ultrasound is recommended for men aged 22-75 years who are current or former smokers. What should I know about preventing infection? Hepatitis B If you have a higher risk for hepatitis B, you should be screened for this virus. Talk with your health care provider to find out if you are at risk for hepatitis B  infection. Hepatitis C Blood testing is recommended for:  Everyone born from [birth year] through 1963.  Anyone with known risk factors for hepatitis C.  Sexually Transmitted Diseases (STDs)  You should be screened each year for STDs including gonorrhea and chlamydia if: ? You are sexually active and are younger than 82 years of age. ? You are older than 82 years of age and your health care provider tells you that you are at risk for this type of infection. ? Your sexual activity has changed since you were last screened and you are at an increased risk for chlamydia or gonorrhea. Ask your health care provider if you are at risk.  Talk with your health care provider about whether you are at high risk of being infected with HIV. Your health care provider may recommend a prescription medicine to help prevent HIV infection.  What else can I do?  Schedule regular health, dental, and eye exams.  Stay current with  your vaccines (immunizations).  Do not use any tobacco products, such as cigarettes, chewing tobacco, and e-cigarettes. If you need help quitting, ask your health care provider.  Limit alcohol intake to no more than 2 drinks per day. One drink equals 12 ounces of beer, 5 ounces of wine, or 1 ounces of hard liquor.  Do not use street drugs.  Do not share needles.  Ask your health care provider for help if you need support or information about quitting drugs.  Tell your health care provider if you often feel depressed.  Tell your health care provider if you have ever been abused or do not feel safe at home. This information is not intended to replace advice given to you by your health care provider. Make sure you discuss any questions you have with your health care provider. Document Released: 11/28/2007 Document Revised: 01/29/2016 Document Reviewed: 03/05/2015 Elsevier Interactive Patient Education  [employer].

## 2017-07-20 ENCOUNTER — Ambulatory Visit (INDEPENDENT_AMBULATORY_CARE_PROVIDER_SITE_OTHER): Payer: Medicare Other | Admitting: Internal Medicine

## 2017-07-20 ENCOUNTER — Encounter: Payer: Self-pay | Admitting: Internal Medicine

## 2017-07-20 VITALS — BP 126/74 | HR 55 | Temp 97.5°F | Resp 14 | Ht 70.0 in | Wt 166.5 lb

## 2017-07-20 DIAGNOSIS — I10 Essential (primary) hypertension: Secondary | ICD-10-CM | POA: Diagnosis not present

## 2017-07-20 DIAGNOSIS — K649 Unspecified hemorrhoids: Secondary | ICD-10-CM

## 2017-07-20 DIAGNOSIS — M810 Age-related osteoporosis without current pathological fracture: Secondary | ICD-10-CM | POA: Diagnosis not present

## 2017-07-20 LAB — COMPREHENSIVE METABOLIC PANEL
ALBUMIN: 3.8 g/dL (ref 3.5–5.2)
ALT: 13 U/L (ref 0–53)
AST: 15 U/L (ref 0–37)
Alkaline Phosphatase: 60 U/L (ref 39–117)
BUN: 19 mg/dL (ref 6–23)
CALCIUM: 9 mg/dL (ref 8.4–10.5)
CHLORIDE: 105 meq/L (ref 96–112)
CO2: 32 mEq/L (ref 19–32)
Creatinine, Ser: 1.11 mg/dL (ref 0.40–1.50)
GFR: 66.9 mL/min (ref >60.00)
Glucose, Bld: 110 mg/dL — ABNORMAL HIGH (ref 70–99)
POTASSIUM: 3.8 meq/L (ref 3.5–5.1)
Sodium: 142 mEq/L (ref 135–145)
Total Bilirubin: 1.1 mg/dL (ref 0.2–1.2)
Total Protein: 6.4 g/dL (ref 6.0–8.3)

## 2017-07-20 MED ORDER — EPINEPHRINE 0.3 MG/0.3ML IJ SOAJ: INTRAMUSCULAR | 2 refills | Status: DC

## 2017-07-20 NOTE — Progress Notes (Signed)
Pre visit review using our clinic review tool, if applicable. No additional management support is needed unless otherwise documented below in the visit note. 

## 2017-07-20 NOTE — Patient Instructions (Addendum)
GO TO THE LAB : Get the blood work     GO TO THE FRONT DESK Schedule your next appointment for a routine checkup in 8 months   Check the  blood pressure 2 or 3 times a month   Be sure your blood pressure is between 110/65 and  135/85. If it is consistently higher or lower, let me know

## 2017-07-20 NOTE — Progress Notes (Signed)
Subjective:    Patient ID: Cory Zavala, male    DOB: 09/22/1930, 84 y.o.   MRN: [id number]  DOS:  07/20/2017 Type of visit - description : rov Interval history: Bee sting allergies: Needs a refill on his EpiPen Long history of hemorrhoids, recently he noted they are "out" all the time.  Denies pain or itching, occasionally sees fresh blood in the toilet paper when he wipes.  HTN: Good compliance with medications.  No recent ambulatory BPs  Review of Systems Denies chest pain or difficulty breathing No nausea, vomiting, diarrhea.  No abdominal pain No dysuria or gross hematuria.   Past Medical History:  Diagnosis Date  . Back pain, chronic   . BPH (benign prostatic hypertrophy)   . Diverticulosis of colon    LEFT  . ED (erectile dysfunction)   . GERD (gastroesophageal reflux disease) 09/15/2012  . Gross hematuria    2015  . HTN (hypertension)   . Nocturia   . Normal cardiac stress test    PER CARDIOLOGIST NOTE, DR NISHAN, APPROX.  1994  . Osteoporosis   . RBBB     Past Surgical History:  Procedure Laterality Date  . CATARACT EXTRACTION W/ INTRAOCULAR LENS  IMPLANT, BILATERAL  2010  . CYSTOSCOPY WITH RETROGRADE PYELOGRAM, URETEROSCOPY AND STENT PLACEMENT Right 12/25/2013   Procedure: CYSTOSCOPY WITH RIGHT RETROGRADE PYELOGRAM, RIGHT URETEROSCOPY  BIOPSY AND STENT: BLADDER BIOPSY;  Surgeon: Claybon Jabs, MD;  Location: [facility];  Service: Urology;  Laterality: Right;  Marland Kitchen VERTEBROPLASTY  2001    Social History   Socioeconomic History  . Marital status: Married    Spouse name: Not on file  . Number of children: 2  . Years of education: Not on file  . Highest education level: Not on file  Social Needs  . Financial resource strain: Not on file  . Food insecurity - worry: Not on file  . Food insecurity - inability: Not on file  . Transportation needs - medical: Not on file  . Transportation needs - non-medical: Not on file  Occupational History  .  Occupation: Theme park manager, semi retired     Fish farm manager: RETIRED  Tobacco Use  . Smoking status: Never Smoker  . Smokeless tobacco: Never Used  Substance and Sexual Activity  . Alcohol use: No  . Drug use: No  . Sexual activity: Not on file  Other Topics Concern  . Not on file  Social History Narrative   Retired, patient was a Company secretary still preachs, Educational psychologist   Married > 71 years, lost wife aprox 5-11. remarried            Allergies as of 07/20/2017      Reactions   Bee Venom Anaphylaxis      Medication List        Accurate as of 07/20/17  7:27 PM. Always use your most recent med list.          aspirin 81 MG tablet Take 81 mg by mouth daily.   D3 ADULT PO Take by mouth.   EPINEPHrine 0.3 mg/0.3 mL Soaj injection Commonly known as:  EPI-PEN INJECT 0.3MLS(CONTENTS OF 1 SYRINGE) INTO THE MUSCLE AS DIRECTED   losartan 50 MG tablet Commonly known as:  COZAAR Take 1 tablet (50 mg total) by mouth daily.   multivitamin tablet Take 1 tablet by mouth daily.   OMEGA-3 FATTY ACIDS PO Take 1 tablet by mouth daily.   OSTEO ADVANCE PO Take 4 tablets by mouth daily.  OSTEO GOLD (BRAND)   pantoprazole 40 MG tablet Commonly known as:  PROTONIX Take 1 tablet (40 mg total) by mouth daily.   PRESERVISION AREDS Caps Take 1 capsule by mouth daily.   vitamin B-12 500 MCG tablet Commonly known as:  CYANOCOBALAMIN Take 500 mcg by mouth daily.          Objective:   Physical Exam BP 126/74 (BP Location: Left Arm, Patient Position: Sitting, Cuff Size: Small)   Pulse (!) 55   Temp (!) 97.5 F (36.4 C) (Oral)   Resp 14   Ht 5\' 10"  (1.778 m)   Wt 166 lb 8 oz (75.5 kg)   SpO2 96%   BMI 23.89 kg/m  General:   Well developed, well nourished . NAD.  HEENT:  Normocephalic . Face symmetric, atraumatic Lungs:  CTA B Normal respiratory effort, no intercostal retractions, no accessory muscle use. Heart: RRR,  no murmur.  no pretibial edema bilaterally  Abdomen:  Not distended,  soft, non-tender. No rebound or rigidity.   Digital rectal exam: External examination with couple of nontender, less than 1 cm hemorrhoids. Prostate enlarged and nontender. Anoscopy: As at least 2 internal hemorrhoids.  Appear to be grade 1 Skin: Not pale. Not jaundice Neurologic:  alert & oriented X3.  Speech normal, gait appropriate for age and unassisted Psych--  Cognition and judgment appear intact.  Cooperative with normal attention span and concentration.  Behavior appropriate. No anxious or depressed appearing.     Assessment & Plan:   Assessment   HTN GERD RBBB MSK: --Chronic back pain -- Vertebroplasty 2001 -- Osteoporosis T score -3.3 (2014), declined all meds 2014 and  2019 GU: ---BPH ---ED ---Gross hematuria 2015  Initial CT show a question of urothelial cancer, subsequently a ureteroscopy was done and reportedly normal, subsequently a biopsy was negative. Follow-up MRI 01-2014 show improvement. Saw urology ~ 04-2015 Urology check PSAs Exercise stress test 2014 negative Bee sting allergies: has a Epi pen  PLAN: Primary care issues reviewed HTN: Seems controlled on losartan, check CMP.  Cholesterol has been consistently very good Osteoporosis: Previously has declined all treatment, risk of fractures and consequent severe decline in his health discussed, still declined treatment. BPH, ED: Sees urology, last available note 04-2016. Allergic reaction: Refill EpiPen Hemorrhoids: He has internal and external hemorrhoids without apparent complications.  Patient wonders about a referral,  I do not see clear indication for surgery or banding.  We agree on OTCs and observation RTC 8 months

## 2017-07-20 NOTE — Assessment & Plan Note (Addendum)
-  Td 2013; pneumonia shot 1998 and 05-2009; prevnar 2016; shingles shot-- 2010; reports a flu shot 02-2017 -CCS:  Cscope 2004 per patient, was told normal  + iFOB 2013, was referred to GI but did not go, repeated test was (-) 2014 -prostate ca screening:  see urology

## 2017-07-20 NOTE — Assessment & Plan Note (Signed)
Primary care issues reviewed HTN: Seems controlled on losartan, check CMP.  Cholesterol has been consistently very good Osteoporosis: Previously has declined all treatment, risk of fractures and consequent severe decline in his health discussed, still declined treatment. BPH, ED: Sees urology, last available note 04-2016. Allergic reaction: Refill EpiPen Hemorrhoids: He has internal and external hemorrhoids without apparent complications.  Patient wonders about a referral,  I do not see clear indication for surgery or banding.  We agree on OTCs and observation RTC 8 months

## 2017-09-20 ENCOUNTER — Other Ambulatory Visit: Payer: Self-pay | Admitting: Internal Medicine

## 2017-11-22 ENCOUNTER — Other Ambulatory Visit: Payer: Self-pay | Admitting: Internal Medicine

## 2018-03-07 DIAGNOSIS — Z23 Encounter for immunization: Secondary | ICD-10-CM | POA: Diagnosis not present

## 2018-03-22 ENCOUNTER — Ambulatory Visit (INDEPENDENT_AMBULATORY_CARE_PROVIDER_SITE_OTHER): Payer: Medicare Other | Admitting: Internal Medicine

## 2018-03-22 ENCOUNTER — Encounter: Payer: Self-pay | Admitting: Internal Medicine

## 2018-03-22 VITALS — BP 128/78 | HR 66 | Temp 98.0°F | Resp 16 | Ht 70.0 in | Wt 161.5 lb

## 2018-03-22 DIAGNOSIS — I10 Essential (primary) hypertension: Secondary | ICD-10-CM | POA: Diagnosis not present

## 2018-03-22 LAB — BASIC METABOLIC PANEL
BUN: 20 mg/dL (ref 6–23)
CALCIUM: 9.4 mg/dL (ref 8.4–10.5)
CO2: 30 mEq/L (ref 19–32)
Chloride: 106 mEq/L (ref 96–112)
Creatinine, Ser: 1.19 mg/dL (ref 0.40–1.50)
GFR: 61.64 mL/min (ref >60.00)
Glucose, Bld: 118 mg/dL — ABNORMAL HIGH (ref 70–99)
Potassium: 4.1 mEq/L (ref 3.5–5.1)
SODIUM: 142 meq/L (ref 135–145)

## 2018-03-22 LAB — LIPID PANEL
Cholesterol: 138 mg/dL (ref 0–200)
HDL: 48.6 mg/dL (ref >39.00)
LDL Cholesterol: 80 mg/dL (ref 0–99)
NONHDL: 89.72
TRIGLYCERIDES: 51 mg/dL (ref 0.0–149.0)
Total CHOL/HDL Ratio: 3
VLDL: 10.2 mg/dL (ref 0.0–40.0)

## 2018-03-22 NOTE — Progress Notes (Signed)
Subjective:    Patient ID: Cory Zavala, male    DOB: [birth year], 84 y.o.   MRN: [id number]  DOS:  03/22/2018 Type of visit - description : rov Interval history: No major concerns.  Good compliance with medication, reports the ambulatory BPs are in the 120s. Had a flu shot already.  Review of Systems Denies dysuria, gross hematuria, urinary frequency. History of hemorrhoids, barely symptomatic, no obvious or heavy bleeding.  Past Medical History:  Diagnosis Date  . Back pain, chronic   . BPH (benign prostatic hypertrophy)   . Diverticulosis of colon    LEFT  . ED (erectile dysfunction)   . GERD (gastroesophageal reflux disease) 09/15/2012  . Gross hematuria    2015  . HTN (hypertension)   . Nocturia   . Normal cardiac stress test    PER CARDIOLOGIST NOTE, DR NISHAN, APPROX.  1994  . Osteoporosis   . RBBB     Past Surgical History:  Procedure Laterality Date  . CATARACT EXTRACTION W/ INTRAOCULAR LENS  IMPLANT, BILATERAL  2010  . CYSTOSCOPY WITH RETROGRADE PYELOGRAM, URETEROSCOPY AND STENT PLACEMENT Right 12/25/2013   Procedure: CYSTOSCOPY WITH RIGHT RETROGRADE PYELOGRAM, RIGHT URETEROSCOPY  BIOPSY AND STENT: BLADDER BIOPSY;  Surgeon: Claybon Jabs, MD;  Location: [facility];  Service: Urology;  Laterality: Right;  Marland Kitchen VERTEBROPLASTY  2001    Social History   Socioeconomic History  . Marital status: Married    Spouse name: Not on file  . Number of children: 2  . Years of education: Not on file  . Highest education level: Not on file  Occupational History  . Occupation: Theme park manager, semi retired     Fish farm manager: RETIRED  Social Needs  . Financial resource strain: Not on file  . Food insecurity:    Worry: Not on file    Inability: Not on file  . Transportation needs:    Medical: Not on file    Non-medical: Not on file  Tobacco Use  . Smoking status: Never Smoker  . Smokeless tobacco: Never Used  Substance and Sexual Activity  . Alcohol use: No  . Drug  use: No  . Sexual activity: Not on file  Lifestyle  . Physical activity:    Days per week: Not on file    Minutes per session: Not on file  . Stress: Not on file  Relationships  . Social connections:    Talks on phone: Not on file    Gets together: Not on file    Attends religious service: Not on file    Active member of club or organization: Not on file    Attends meetings of clubs or organizations: Not on file    Relationship status: Not on file  . Intimate partner violence:    Fear of current or ex partner: Not on file    Emotionally abused: Not on file    Physically abused: Not on file    Forced sexual activity: Not on file  Other Topics Concern  . Not on file  Social History Narrative   Retired, patient was a Company secretary still preachs, Educational psychologist   Married > 33 years, lost wife aprox 5-11. remarried            Allergies as of 03/22/2018      Reactions   Bee Venom Anaphylaxis      Medication List        Accurate as of 03/22/18 11:59 PM. Always use your most recent med list.  aspirin 81 MG tablet Take 81 mg by mouth daily.   D3 ADULT PO Take by mouth.   EPINEPHrine 0.3 mg/0.3 mL Soaj injection Commonly known as:  EPI-PEN INJECT 0.3MLS(CONTENTS OF 1 SYRINGE) INTO THE MUSCLE AS DIRECTED   losartan 50 MG tablet Commonly known as:  COZAAR Take 1 tablet (50 mg total) by mouth daily.   multivitamin tablet Take 1 tablet by mouth daily.   OMEGA-3 FATTY ACIDS PO Take 1 tablet by mouth daily.   OSTEO ADVANCE PO Take 4 tablets by mouth daily. OSTEO GOLD (BRAND)   pantoprazole 40 MG tablet Commonly known as:  PROTONIX Take 1 tablet (40 mg total) by mouth daily.   PRESERVISION AREDS Caps Take 1 capsule by mouth daily.   vitamin B-12 500 MCG tablet Commonly known as:  CYANOCOBALAMIN Take 500 mcg by mouth daily.          Objective:   Physical Exam BP 128/78 (BP Location: Left Arm, Patient Position: Sitting, Cuff Size: Normal)   Pulse 66    Temp 98 F (36.7 C) (Oral)   Resp 16   Ht 5\' 10"  (1.778 m)   Wt 161 lb 8 oz (73.3 kg)   BMI 23.17 kg/m  General:   Well developed, NAD, see BMI.  HEENT:  Normocephalic . Face symmetric, atraumatic Lungs:  CTA B Normal respiratory effort, no intercostal retractions, no accessory muscle use. Heart: RRR,  no murmur.  No pretibial edema bilaterally  Skin: Not pale. Not jaundice Neurologic:  alert & oriented X3.  Speech normal, gait appropriate for age and unassisted Psych--  Cognition and judgment appear intact.  Cooperative with normal attention span and concentration.  Behavior appropriate. No anxious or depressed appearing.      Assessment & Plan:   Assessment   HTN GERD RBBB MSK: --Chronic back pain -- Vertebroplasty 2001 -- Osteoporosis T score -3.3 (2014), declined all meds 2014 and  2019 GU: ---BPH ---ED ---Gross hematuria 2015  Initial CT show a question of urothelial cancer, subsequently a ureteroscopy was done and reportedly normal, subsequently a biopsy was negative. Follow-up MRI 01-2014 show improvement. Saw urology ~ 04-2015 Urology check PSAs Exercise stress test 2014 negative Bee sting allergies: has a Epi pen  PLAN: HTN: Good compliance with losartan, check a BMP and FLP.  Patient fasting BPH: Essentially no symptoms, no medication, patient states he probably will not see urology again.  I do not disagree.  Reassess on RTC Had a flu shot already RTC 6 to 8 months

## 2018-03-22 NOTE — Patient Instructions (Signed)
GO TO THE LAB : Get the blood work     GO TO THE FRONT DESK Schedule your next appointment for a  Check up in 6-8 months  

## 2018-03-22 NOTE — Progress Notes (Signed)
Pre visit review using our clinic review tool, if applicable. No additional management support is needed unless otherwise documented below in the visit note. 

## 2018-03-23 NOTE — Assessment & Plan Note (Signed)
  HTN: Good compliance with losartan, check a BMP and FLP.  Patient fasting BPH: Essentially no symptoms, no medication, patient states he probably will not see urology again.  I do not disagree.  Reassess on RTC Had a flu shot already RTC 6 to 8 months

## 2018-03-31 ENCOUNTER — Other Ambulatory Visit: Payer: Self-pay | Admitting: Internal Medicine

## 2018-05-26 ENCOUNTER — Other Ambulatory Visit: Payer: Self-pay | Admitting: Internal Medicine

## 2018-06-20 DIAGNOSIS — H524 Presbyopia: Secondary | ICD-10-CM | POA: Diagnosis not present

## 2018-06-20 DIAGNOSIS — H52223 Regular astigmatism, bilateral: Secondary | ICD-10-CM | POA: Diagnosis not present

## 2018-06-20 DIAGNOSIS — H5213 Myopia, bilateral: Secondary | ICD-10-CM | POA: Diagnosis not present

## 2018-06-22 DIAGNOSIS — N3000 Acute cystitis without hematuria: Secondary | ICD-10-CM | POA: Diagnosis not present

## 2018-06-22 DIAGNOSIS — N401 Enlarged prostate with lower urinary tract symptoms: Secondary | ICD-10-CM | POA: Diagnosis not present

## 2018-06-22 DIAGNOSIS — R3915 Urgency of urination: Secondary | ICD-10-CM | POA: Diagnosis not present

## 2018-09-20 ENCOUNTER — Other Ambulatory Visit: Payer: Self-pay | Admitting: Internal Medicine

## 2018-10-22 ENCOUNTER — Other Ambulatory Visit: Payer: Self-pay

## 2018-10-22 ENCOUNTER — Ambulatory Visit (HOSPITAL_COMMUNITY)
Admission: EM | Admit: 2018-10-22 | Discharge: 2018-10-22 | Disposition: A | Discharge status: Home / Self Care | Payer: Medicare HMO | Attending: Family Medicine | Admitting: Family Medicine

## 2018-10-22 DIAGNOSIS — N3000 Acute cystitis without hematuria: Secondary | ICD-10-CM | POA: Diagnosis not present

## 2018-10-22 LAB — POCT URINALYSIS DIP (DEVICE)
Bilirubin Urine: NEGATIVE
Glucose, UA: NEGATIVE mg/dL
Ketones, ur: NEGATIVE mg/dL
Nitrite: NEGATIVE
Protein, ur: 30 mg/dL — AB
Specific Gravity, Urine: 1.02 (ref 1.005–1.030)
Urobilinogen, UA: 0.2 mg/dL (ref 0.0–1.0)
pH: 8.5 — ABNORMAL HIGH (ref 5.0–8.0)

## 2018-10-22 MED ORDER — NITROFURANTOIN MONOHYD MACRO 100 MG PO CAPS: 100.0000 mg | ORAL_CAPSULE | Freq: Two times a day (BID) | ORAL | 0 refills | Status: AC

## 2018-10-22 NOTE — ED Provider Notes (Signed)
  [name]    CSN: [id number] Arrival date & time: 10/22/18  1707     History    Chief Complaint  Patient presents with  . Urinary Frequency    Cory Zavala is a 85 y.o. male here for possible UTI.  Duration: 1 week. Symptoms: urinary frequency, urinary hesitancy, urinary retention, fever (100.5 F max) and dysuria Hx of UTI, did well with Macrobid. Denies: hematuria, nausea, vomiting, urinary incontinence and flank pain No hx of prostatitis. Tylenol was helpful for fever.   ROS:  Constitutional: + fever GU: As noted in HPI  Past Medical History:  Diagnosis Date  . Back pain, chronic   . BPH (benign prostatic hypertrophy)   . Diverticulosis of colon    LEFT  . ED (erectile dysfunction)   . GERD (gastroesophageal reflux disease) 09/15/2012  . Gross hematuria    2015  . HTN (hypertension)   . Nocturia   . Normal cardiac stress test    PER CARDIOLOGIST NOTE, DR NISHAN, APPROX.  1994  . Osteoporosis   . RBBB     BP (!) 168/94 (BP Location: Right Arm)   Pulse 86   Temp 98.6 F (37 C) (Oral)   Resp 16   SpO2 96%  General: Awake, alert, appears stated age Heart: RRR Lungs: CTAB, normal respiratory effort, no accessory muscle usage Abd: BS+, soft, NT, ND, no masses or organomegaly MSK: No CVA tenderness, neg Lloyd's sign Psych: Age appropriate judgment and insight  Final Clinical Impressions(s) / UC Diagnoses   Final diagnoses:  Acute cystitis without hematuria   Re-rx what worked well before. Declined rectal exam. F/u with Dr. Larose Kells this week if no better.  ED Prescriptions    Medication Sig Dispense Auth. Provider   nitrofurantoin, macrocrystal-monohydrate, (MACROBID) 100 MG capsule Take 1 capsule (100 mg total) by mouth 2 (two) times daily for 7 days. 14 capsule Shelda Pal, DO     Controlled Substance Prescriptions [place] Controlled Substance Registry consulted? Not Applicable   Shelda Pal, [place] 10/22/18 2202

## 2018-10-22 NOTE — ED Triage Notes (Signed)
Per pt he has been having urinary frequency for about 1 week k and then today spiked a low grade fever. Burns with urination

## 2018-10-24 ENCOUNTER — Telehealth (HOSPITAL_COMMUNITY): Payer: Self-pay | Admitting: Emergency Medicine

## 2018-10-24 LAB — URINE CULTURE: Culture: >=100000 — AB

## 2018-10-24 NOTE — Telephone Encounter (Signed)
Urine culture was positive for KLEBSIELLA PNEUMONIAE and was given nitrofurantoin  at urgent care visit. Pt contacted and made aware, educated on completing antibiotic and to follow up if symptoms are persistent. Verbalized understanding.

## 2018-10-25 ENCOUNTER — Telehealth: Payer: Self-pay

## 2018-10-25 NOTE — Telephone Encounter (Signed)
Pt saw Dr. Nani Ravens at urgent care on 10/22/2018- needs urgent care follow-up this week please.

## 2018-10-25 NOTE — Telephone Encounter (Signed)
LVM for pt to schedule fu appt Urgent care VOV.

## 2018-11-03 ENCOUNTER — Other Ambulatory Visit: Payer: Self-pay | Admitting: Family Medicine

## 2018-11-04 ENCOUNTER — Ambulatory Visit (INDEPENDENT_AMBULATORY_CARE_PROVIDER_SITE_OTHER): Payer: Medicare HMO | Admitting: Internal Medicine

## 2018-11-04 ENCOUNTER — Other Ambulatory Visit: Payer: Self-pay

## 2018-11-04 DIAGNOSIS — N390 Urinary tract infection, site not specified: Secondary | ICD-10-CM | POA: Diagnosis not present

## 2018-11-04 MED ORDER — SULFAMETHOXAZOLE-TRIMETHOPRIM 800-160 MG PO TABS: 1.0000 | ORAL_TABLET | Freq: Two times a day (BID) | ORAL | 0 refills | Status: DC

## 2018-11-04 NOTE — Progress Notes (Signed)
Subjective:    Patient ID: Cory Zavala, male    DOB: Oct 29, 1930, 85 y.o.   MRN: [id number]  DOS:  11/04/2018 Type of visit - description: Attempted  to make this a video visit, due to technical difficulties from the patient side it was not possible  thus we proceeded with a Virtual Visit via Telephone    I connected with@ on 11/07/18 at  1:20 PM EDT by telephone and verified that I am speaking with the correct person using two identifiers.  THIS ENCOUNTER IS A VIRTUAL VISIT DUE TO COVID-19 - PATIENT WAS NOT SEEN IN THE OFFICE. PATIENT HAS CONSENTED TO VIRTUAL VISIT / TELEMEDICINE VISIT   Location of patient: home  Location of provider: office  I discussed the limitations, risks, security and privacy concerns of performing an evaluation and management service by telephone and the availability of in person appointments. I also discussed with the patient that there may be a patient responsible charge related to this service. The patient expressed understanding and agreed to proceed.   History of Present Illness: Acute Urinary symptoms are started approximately 10/15/2018: Dysuria, frequency. Was seen at the urgent care 10/22/2018, prescribed Macrobid, urine culture eventually showed Klebsiella. With antibiotics symptoms improved but they did not completely resolved. Still has mild symptoms particularly at night. Early in the mornings he is having some difficulty urinating.    Review of Systems No fever chills No nausea, vomiting, diarrhea No abdominal pain or flank pain No gross hematuria Prior to the onset of UTI symptoms had a rash, that is resolved, rash happening before he took [place].  Past Medical History:  Diagnosis Date  . Back pain, chronic   . BPH (benign prostatic hypertrophy)   . Diverticulosis of colon    LEFT  . ED (erectile dysfunction)   . GERD (gastroesophageal reflux disease) 09/15/2012  . Gross hematuria    2015  . HTN (hypertension)   . Nocturia   . Normal  cardiac stress test    PER CARDIOLOGIST NOTE, DR NISHAN, APPROX.  1994  . Osteoporosis   . RBBB     Past Surgical History:  Procedure Laterality Date  . CATARACT EXTRACTION W/ INTRAOCULAR LENS  IMPLANT, BILATERAL  2010  . CYSTOSCOPY WITH RETROGRADE PYELOGRAM, URETEROSCOPY AND STENT PLACEMENT Right 12/25/2013   Procedure: CYSTOSCOPY WITH RIGHT RETROGRADE PYELOGRAM, RIGHT URETEROSCOPY  BIOPSY AND STENT: BLADDER BIOPSY;  Surgeon: Claybon Jabs, MD;  Location: [facility];  Service: Urology;  Laterality: Right;  Marland Kitchen VERTEBROPLASTY  2001    Social History   Socioeconomic History  . Marital status: Married    Spouse name: Not on file  . Number of children: 2  . Years of education: Not on file  . Highest education level: Not on file  Occupational History  . Occupation: Theme park manager, semi retired     Fish farm manager: RETIRED  Social Needs  . Financial resource strain: Not on file  . Food insecurity:    Worry: Not on file    Inability: Not on file  . Transportation needs:    Medical: Not on file    Non-medical: Not on file  Tobacco Use  . Smoking status: Never Smoker  . Smokeless tobacco: Never Used  Substance and Sexual Activity  . Alcohol use: No  . Drug use: No  . Sexual activity: Not on file  Lifestyle  . Physical activity:    Days per week: Not on file    Minutes per session: Not on  file  . Stress: Not on file  Relationships  . Social connections:    Talks on phone: Not on file    Gets together: Not on file    Attends religious service: Not on file    Active member of club or organization: Not on file    Attends meetings of clubs or organizations: Not on file    Relationship status: Not on file  . Intimate partner violence:    Fear of current or ex partner: Not on file    Emotionally abused: Not on file    Physically abused: Not on file    Forced sexual activity: Not on file  Other Topics Concern  . Not on file  Social History Narrative   Retired, patient was  a Company secretary still preachs, Educational psychologist   Married > 7 years, lost wife aprox 5-11. remarried            Allergies as of 11/04/2018      Reactions   Bee Venom Anaphylaxis      Medication List       Accurate as of Nov 04, 2018 11:59 PM. If you have any questions, ask your nurse or doctor.        aspirin 81 MG tablet Take 81 mg by mouth daily.   D3 ADULT PO Take by mouth.   EPINEPHrine 0.3 mg/0.3 mL Soaj injection Commonly known as:  EPI-PEN INJECT 0.3MLS(CONTENTS OF 1 SYRINGE) INTO THE MUSCLE AS DIRECTED   losartan 50 MG tablet Commonly known as:  COZAAR Take 1 tablet (50 mg total) by mouth daily.   multivitamin tablet Take 1 tablet by mouth daily.   OMEGA-3 FATTY ACIDS PO Take 1 tablet by mouth daily.   OSTEO ADVANCE PO Take 4 tablets by mouth daily. OSTEO GOLD (BRAND)   pantoprazole 40 MG tablet Commonly known as:  PROTONIX Take 1 tablet (40 mg total) by mouth daily.   PreserVision AREDS Caps Take 1 capsule by mouth daily.   sulfamethoxazole-trimethoprim 800-160 MG tablet Commonly known as:  BACTRIM DS Take 1 tablet by mouth 2 (two) times daily. Started by:  Kathlene November, MD   vitamin B-12 500 MCG tablet Commonly known as:  CYANOCOBALAMIN Take 500 mcg by mouth daily.           Objective:   Physical Exam There were no vitals taken for this visit. This is a virtual phone visit.  Alert oriented x3, no apparent distress, Vital signs today at home: 136/85, pulse 67, temperature 98.4 degrees    Assessment     Assessment   HTN GERD RBBB MSK: --Chronic back pain -- Vertebroplasty 2001 -- Osteoporosis T score -3.3 (2014), declined all meds 2014 and  2019 GU: ---BPH ---ED ---Gross hematuria 2015  Initial CT show a question of urothelial cancer, subsequently a ureteroscopy was done and reportedly normal, subsequently a biopsy was negative. Follow-up MRI 01-2014 show improvement. Saw urology ~ 04-2015 Urology check PSAs Exercise stress test 2014  negative Bee sting allergies: has a Epi pen  PLAN: UTI: Documented UTI, urine culture showing Klebsiella, had Macrobid x7 days, symptoms improved but not completely gone.  Probably has a partially treated UTI Plan: Increase fluid intake, Bactrim DS x10-day, avoid excessive sun exposure while on antibiotics. He has an appointment with me next month, will recheck UA noting that he has a history of gross hematuria. Patient verbalized understanding.  I discussed the assessment and treatment plan with the patient. The patient was provided an opportunity to  ask questions and all were answered. The patient agreed with the plan and demonstrated an understanding of the instructions.   The patient was advised to call back or seek an in-person evaluation if the symptoms worsen or if the condition fails to improve as anticipated.  I provided 16 minutes of non-face-to-face time during this encounter.  Kathlene November, MD

## 2018-11-07 NOTE — Assessment & Plan Note (Signed)
UTI: Documented UTI, urine culture showing Klebsiella, had Macrobid x7 days, symptoms improved but not completely gone.  Probably has a partially treated UTI Plan: Increase fluid intake, Bactrim DS x10-day, avoid excessive sun exposure while on antibiotics. He has an appointment with me next month, will recheck UA noting that he has a history of gross hematuria. Patient verbalized understanding.

## 2018-11-21 ENCOUNTER — Ambulatory Visit: Payer: Medicare Other | Admitting: Internal Medicine

## 2018-12-01 ENCOUNTER — Encounter (HOSPITAL_COMMUNITY): Payer: Self-pay | Admitting: Emergency Medicine

## 2018-12-01 ENCOUNTER — Other Ambulatory Visit: Payer: Self-pay

## 2018-12-01 ENCOUNTER — Ambulatory Visit (HOSPITAL_COMMUNITY)
Admission: EM | Admit: 2018-12-01 | Discharge: 2018-12-01 | Disposition: A | Discharge status: Home / Self Care | Payer: Medicare HMO | Attending: Family Medicine | Admitting: Family Medicine

## 2018-12-01 DIAGNOSIS — N10 Acute tubulo-interstitial nephritis: Secondary | ICD-10-CM | POA: Diagnosis not present

## 2018-12-01 LAB — POCT URINALYSIS DIP (DEVICE)
Bilirubin Urine: NEGATIVE
Glucose, UA: NEGATIVE mg/dL
Ketones, ur: 15 mg/dL — AB
Nitrite: POSITIVE — AB
Protein, ur: 30 mg/dL — AB
Specific Gravity, Urine: 1.02 (ref 1.005–1.030)
Urobilinogen, UA: 2 mg/dL — ABNORMAL HIGH (ref 0.0–1.0)
pH: 6.5 (ref 5.0–8.0)

## 2018-12-01 MED ORDER — CEFTRIAXONE SODIUM 250 MG IJ SOLR
250.0000 mg | Freq: Once | INTRAMUSCULAR | Status: AC
  Administered 2018-12-01: 250 mg via INTRAMUSCULAR

## 2018-12-01 MED ORDER — CEFTRIAXONE SODIUM 250 MG IJ SOLR
INTRAMUSCULAR | Status: AC
  Filled 2018-12-01: qty 250

## 2018-12-01 MED ORDER — CEPHALEXIN 500 MG PO CAPS: 500.0000 mg | ORAL_CAPSULE | Freq: Two times a day (BID) | ORAL | 0 refills | Status: DC

## 2018-12-01 NOTE — Discharge Instructions (Addendum)
You need to send someone to get your antibiotic tomorrow morning when the pharmacy opens Take your antibiotic 2 times a day Drink more water Take Tylenol for pain or fever Call your primary care doctor tomorrow  You must go to the emergency room if you become worse instead of better, high fever, vomiting, increased pain or weakness

## 2018-12-01 NOTE — ED Provider Notes (Signed)
[place]    CSN: [id number] Arrival date & time: 12/01/18  [facility]      History   Chief Complaint Chief Complaint  Patient presents with  . Urinary Tract Infection  . Fever    HPI Cory Zavala is a 85 y.o. male.   HPI  [age] year old gentleman with known BPH.  At baseline has nocturia 3-4 times a night.  He has had 3 urinary tract infections in the last month.  The first 1 was found to be Klebsiella on culture and he was given 7 days of Macrodantin.  After this he still had mild symptoms so his physician empirically gave him 10 days of Septra based on a video call.  His physician did have the culture and sensitivity report.  After the 10 days of Septra he felt well for a few days but then started having dysuria again.  Today he is here with dysuria, frequency, back pain, and fever.  The back pain and fever to started last night.  He states he has no appetite.  He has not vomited but he feels like a well if he tries to eat anything.  Temperature here is 99.6 although he did take 2 Tylenol earlier this evening for the back pain.  Back pain is much improved. He has been seen by urology years ago.  He was placed on BPH medication.  They caused acute urinary retention.  He has not taken them since. He has had bladder infections but not a kidney infection before No known kidney stones Non-smoker  Past Medical History:  Diagnosis Date  . Back pain, chronic   . BPH (benign prostatic hypertrophy)   . Diverticulosis of colon    LEFT  . ED (erectile dysfunction)   . GERD (gastroesophageal reflux disease) 09/15/2012  . Gross hematuria    2015  . HTN (hypertension)   . Nocturia   . Normal cardiac stress test    PER CARDIOLOGIST NOTE, DR NISHAN, APPROX.  1994  . Osteoporosis   . RBBB     Patient Active Problem List   Diagnosis Date Noted  . Hemorrhoids 07/20/2017  . Seasonal allergies 09/30/2015  . PCP NOTES >>>> 05/15/2015  . Gross hematuria 08/09/2014  .  Hyperglycemia 08/07/2014  . Osteoporosis 06/02/2013  . GERD (gastroesophageal reflux disease) 09/15/2012  . Chest pain 09/15/2012  . Back pain 02/12/2012  . Annual physical exam 09/01/2011  . Erectile dysfunction 09/01/2011  . DEGENERATIVE JOINT DISEASE 05/20/2009  . PSA, INCREASED 05/20/2009  . Essential hypertension 07/25/2007  . BENIGN PROSTATIC HYPERTROPHY 07/25/2007    Past Surgical History:  Procedure Laterality Date  . CATARACT EXTRACTION W/ INTRAOCULAR LENS  IMPLANT, BILATERAL  2010  . CYSTOSCOPY WITH RETROGRADE PYELOGRAM, URETEROSCOPY AND STENT PLACEMENT Right 12/25/2013   Procedure: CYSTOSCOPY WITH RIGHT RETROGRADE PYELOGRAM, RIGHT URETEROSCOPY  BIOPSY AND STENT: BLADDER BIOPSY;  Surgeon: Claybon Jabs, MD;  Location: [facility];  Service: Urology;  Laterality: Right;  Marland Kitchen VERTEBROPLASTY  2001       Home Medications    Prior to Admission medications   Medication Sig Start Date End Date Taking? Authorizing Provider  aspirin 81 MG tablet Take 81 mg by mouth daily.    [provider]  cephALEXin (KEFLEX) 500 MG capsule Take 1 capsule (500 mg total) by mouth 2 (two) times daily. 12/01/18   Raylene Everts, MD  Cholecalciferol (D3 ADULT PO) Take by mouth.    [provider]  EPINEPHrine  0.3 mg/0.3 mL IJ SOAJ injection INJECT 0.3MLS(CONTENTS OF 1 SYRINGE) INTO THE MUSCLE AS DIRECTED Patient not taking: Reported on 03/22/2018 07/20/17   Colon Branch, MD  losartan (COZAAR) 50 MG tablet Take 1 tablet (50 mg total) by mouth daily. 09/20/18   Colon Branch, MD  Multiple Vitamin (MULTIVITAMIN) tablet Take 1 tablet by mouth daily.    [provider]  Multiple Vitamins-Minerals (PRESERVISION AREDS) CAPS Take 1 capsule by mouth daily.    [provider]  Nutritional Supplements (OSTEO ADVANCE PO) Take 4 tablets by mouth daily. OSTEO GOLD (BRAND)    [provider]  OMEGA-3 FATTY ACIDS PO Take 1 tablet by mouth daily.    [provider]  pantoprazole (PROTONIX) 40 MG tablet Take 1 tablet (40 mg total) by mouth daily. 09/20/18   Colon Branch, MD  vitamin B-12 (CYANOCOBALAMIN) 500 MCG tablet Take 500 mcg by mouth daily.    [provider]    Family History Family History  Problem Relation Age of Onset  . CAD Neg Hx   . Diabetes Neg Hx   . Colon cancer Neg Hx   . Prostate cancer Neg Hx     Social History Social History   Tobacco Use  . Smoking status: Never Smoker  . Smokeless tobacco: Never Used  Substance Use Topics  . Alcohol use: No  . Drug use: No     Allergies   Bee venom   Review of Systems Review of Systems  Constitutional: Positive for appetite change. Negative for chills and fever.  HENT: Negative for ear pain and sore throat.   Eyes: Negative for pain and visual disturbance.  Respiratory: Negative for cough and shortness of breath.   Cardiovascular: Negative for chest pain and palpitations.  Gastrointestinal: Negative for abdominal pain and vomiting.  Genitourinary: Positive for flank pain, frequency and hematuria. Negative for dysuria.       Nocturia  Musculoskeletal: Negative for arthralgias and back pain.  Skin: Negative for color change and rash.  Neurological: Negative for seizures and syncope.  All other systems reviewed and are negative.    Physical Exam Triage Vital Signs ED Triage Vitals [12/01/18 1911]  Enc Vitals Group     BP (!) 139/99     Pulse Rate (!) 103     Resp 18     Temp 99.6 F (37.6 C)     Temp Source Oral     SpO2 97 %     Weight      Height      Head Circumference      Peak Flow      Pain Score 6     Pain Loc      Pain Edu?      Excl. in [place]?    No data found.  Updated Vital Signs BP (!) 139/99 (BP Location: Left Arm)   Pulse (!) 103   Temp 99.6 F (37.6 C) (Oral)   Resp 18   SpO2 97%      Physical Exam Constitutional:      General: He is not in acute distress.    Appearance: He is well-developed and normal weight.   HENT:     Head: Normocephalic and atraumatic.  Eyes:     Conjunctiva/sclera: Conjunctivae normal.     Pupils: Pupils are equal, round, and reactive to light.  Neck:     Musculoskeletal: Normal range of motion and neck supple.  Cardiovascular:  Rate and Rhythm: Normal rate and regular rhythm.     Heart sounds: Normal heart sounds.     Comments: Frequent ectopy Pulmonary:     Effort: Pulmonary effort is normal. No respiratory distress.     Breath sounds: Normal breath sounds.  Abdominal:     General: There is no distension.     Palpations: Abdomen is soft.     Tenderness: There is right CVA tenderness. There is no left CVA tenderness.  Musculoskeletal: Normal range of motion.  Skin:    General: Skin is warm and dry.  Neurological:     General: No focal deficit present.     Mental Status: He is alert.  Psychiatric:        Mood and Affect: Mood normal.        Behavior: Behavior normal.      UC Treatments / Results  Labs (all labs ordered are listed, but only abnormal results are displayed) Labs Reviewed  POCT URINALYSIS DIP (DEVICE) - Abnormal; Notable for the following components:      Result Value   Ketones, ur 15 (*)    Hgb urine dipstick MODERATE (*)    Protein, ur 30 (*)    Urobilinogen, UA 2.0 (*)    Nitrite POSITIVE (*)    Leukocytes,Ua LARGE (*)    All other components within normal limits  URINE CULTURE    EKG None  Radiology No results found.  Procedures Procedures (including critical care time)  Medications Ordered in UC Medications  cefTRIAXone (ROCEPHIN) injection 250 mg (250 mg Intramuscular Given 12/01/18 1940)  cefTRIAXone (ROCEPHIN) 250 MG injection (has no administration in time range)    Initial Impression / Assessment and Plan / UC Course  I have reviewed the triage vital signs and the nursing notes.  Pertinent labs & imaging results that were available during my care of the patient were reviewed by me and considered in my medical  decision making (see chart for details).     Going to give him a shot of Rocephin tonight.  He needs to be on Keflex for at least 10 days.  He should call his PCP tomorrow.  I did try to impress upon him the importance of returning to the hospital if he gets worse instead of better Final Clinical Impressions(s) / UC Diagnoses   Final diagnoses:  Acute pyelonephritis     Discharge Instructions     You need to send someone to get your antibiotic tomorrow morning when the pharmacy opens Take your antibiotic 2 times a day Drink more water Take Tylenol for pain or fever Call your primary care doctor tomorrow  You must go to the emergency room if you become worse instead of better, high fever, vomiting, increased pain or weakness   ED Prescriptions    Medication Sig Dispense Auth. Provider   cephALEXin (KEFLEX) 500 MG capsule Take 1 capsule (500 mg total) by mouth 2 (two) times daily. 20 capsule Raylene Everts, MD     Controlled Substance Prescriptions [place] Controlled Substance Registry consulted? Not Applicable   Raylene Everts, MD 12/01/18 1950

## 2018-12-01 NOTE — ED Triage Notes (Signed)
Pt here for dysuria x 2 days and fever tonight

## 2018-12-03 LAB — URINE CULTURE: Culture: >=100000 — AB

## 2018-12-08 ENCOUNTER — Telehealth (HOSPITAL_COMMUNITY): Payer: Self-pay | Admitting: Emergency Medicine

## 2018-12-08 NOTE — Telephone Encounter (Signed)
Urine culture was positive for KLEBSIELLA PNEUMONIAE and was given keflex  at urgent care visit. Attempted to reach patient. No answer at this time.

## 2018-12-21 ENCOUNTER — Ambulatory Visit (INDEPENDENT_AMBULATORY_CARE_PROVIDER_SITE_OTHER): Payer: Medicare HMO | Admitting: Internal Medicine

## 2018-12-21 ENCOUNTER — Other Ambulatory Visit: Payer: Self-pay

## 2018-12-21 ENCOUNTER — Encounter: Payer: Self-pay | Admitting: Internal Medicine

## 2018-12-21 VITALS — BP 149/72 | HR 69 | Temp 98.1°F | Resp 16 | Ht 70.0 in | Wt 161.0 lb

## 2018-12-21 DIAGNOSIS — R739 Hyperglycemia, unspecified: Secondary | ICD-10-CM | POA: Diagnosis not present

## 2018-12-21 DIAGNOSIS — N12 Tubulo-interstitial nephritis, not specified as acute or chronic: Secondary | ICD-10-CM | POA: Diagnosis not present

## 2018-12-21 DIAGNOSIS — I10 Essential (primary) hypertension: Secondary | ICD-10-CM

## 2018-12-21 DIAGNOSIS — N390 Urinary tract infection, site not specified: Secondary | ICD-10-CM

## 2018-12-21 DIAGNOSIS — T7840XD Allergy, unspecified, subsequent encounter: Secondary | ICD-10-CM

## 2018-12-21 MED ORDER — HYDROCORTISONE 2.5 % EX CREA: TOPICAL_CREAM | Freq: Two times a day (BID) | CUTANEOUS | 1 refills | Status: AC

## 2018-12-21 NOTE — Patient Instructions (Signed)
Get the blood work     [place] Schedule your next appointment   for a checkup in 4 months  We are referring you to urology  For itching:  OTC Claritin 10 mg 1 tablet daily.  Also use the cream I sent to your pharmacy, hydrocortisone. Call if not gradually better  Your blood pressure is a slightly elevated today. Please to start checking blood pressures at home to 3 times a week.   GOAL is between 110/65 and  135/85. If it is consistently higher or lower, let me know

## 2018-12-21 NOTE — Progress Notes (Signed)
Pre visit review using our clinic review tool, if applicable. No additional management support is needed unless otherwise documented below in the visit note. 

## 2018-12-21 NOTE — Progress Notes (Signed)
Subjective:    Patient ID: Cory Zavala, male    DOB: 11/07/1930, 85 y.o.   MRN: [id number]  DOS:  12/21/2018 Type of visit - description: acute At the last visit, he was seen with partially treated UTI, prescribed additional antibiotics, became  asymptomatic.  Then sxs  resurfaced: Dysuria, urinary frequency, fever up to 100.4 and mild back pain. Went to the urgent care 12/01/2018, dx w/ a UTI, got Rocephin shot.  Ultimately, U CX showed Klebsiella and he took Keflex for 10 days. Within few days become completely asymptomatic and remains asymptomatic today.   Also, for the last few months, on and off has red spots mostly at night and sometimes after he takes a shower.  They can be in the abdomen or back, they are extremely pruritic.  If he leaves them alone, they go away soon, if he starts scratching, he noticed dermatographism and increased itching.  BP Readings from Last 3 Encounters:  12/21/18 (!) 149/72  12/01/18 (!) 139/99  10/22/18 (!) 168/94     Review of Systems Currently with no fever chills no nausea or vomiting. No hematuria or difficulty urinating.  Past Medical History:  Diagnosis Date  . Back pain, chronic   . BPH (benign prostatic hypertrophy)   . Diverticulosis of colon    LEFT  . ED (erectile dysfunction)   . GERD (gastroesophageal reflux disease) 09/15/2012  . Gross hematuria    2015  . HTN (hypertension)   . Nocturia   . Normal cardiac stress test    PER CARDIOLOGIST NOTE, DR NISHAN, APPROX.  1994  . Osteoporosis   . RBBB     Past Surgical History:  Procedure Laterality Date  . CATARACT EXTRACTION W/ INTRAOCULAR LENS  IMPLANT, BILATERAL  2010  . CYSTOSCOPY WITH RETROGRADE PYELOGRAM, URETEROSCOPY AND STENT PLACEMENT Right 12/25/2013   Procedure: CYSTOSCOPY WITH RIGHT RETROGRADE PYELOGRAM, RIGHT URETEROSCOPY  BIOPSY AND STENT: BLADDER BIOPSY;  Surgeon: Claybon Jabs, MD;  Location: [facility];  Service: Urology;  Laterality: Right;  Marland Kitchen  VERTEBROPLASTY  2001    Social History   Socioeconomic History  . Marital status: Married    Spouse name: Not on file  . Number of children: 2  . Years of education: Not on file  . Highest education level: Not on file  Occupational History  . Occupation: Theme park manager, semi retired     Fish farm manager: RETIRED  Social Needs  . Financial resource strain: Not on file  . Food insecurity    Worry: Not on file    Inability: Not on file  . Transportation needs    Medical: Not on file    Non-medical: Not on file  Tobacco Use  . Smoking status: Never Smoker  . Smokeless tobacco: Never Used  Substance and Sexual Activity  . Alcohol use: No  . Drug use: No  . Sexual activity: Not on file  Lifestyle  . Physical activity    Days per week: Not on file    Minutes per session: Not on file  . Stress: Not on file  Relationships  . Social Herbalist on phone: Not on file    Gets together: Not on file    Attends religious service: Not on file    Active member of club or organization: Not on file    Attends meetings of clubs or organizations: Not on file    Relationship status: Not on file  . Intimate partner violence  Fear of current or ex partner: Not on file    Emotionally abused: Not on file    Physically abused: Not on file    Forced sexual activity: Not on file  Other Topics Concern  . Not on file  Social History Narrative   Retired, patient was a Company secretary still preachs, Educational psychologist   Married > 13 years, lost wife aprox 5-11. remarried            Allergies as of 12/21/2018      Reactions   Bee Venom Anaphylaxis      Medication List       Accurate as of December 21, 2018  2:00 PM. If you have any questions, ask your nurse or doctor.        aspirin 81 MG tablet Take 81 mg by mouth daily.   cephALEXin 500 MG capsule Commonly known as: KEFLEX Take 1 capsule (500 mg total) by mouth 2 (two) times daily.   D3 ADULT PO Take by mouth.   EPINEPHrine 0.3 mg/0.3 mL Soaj  injection Commonly known as: EPI-PEN INJECT 0.3MLS(CONTENTS OF 1 SYRINGE) INTO THE MUSCLE AS DIRECTED   losartan 50 MG tablet Commonly known as: COZAAR Take 1 tablet (50 mg total) by mouth daily.   multivitamin tablet Take 1 tablet by mouth daily.   OMEGA-3 FATTY ACIDS PO Take 1 tablet by mouth daily.   OSTEO ADVANCE PO Take 4 tablets by mouth daily. OSTEO GOLD (BRAND)   pantoprazole 40 MG tablet Commonly known as: PROTONIX Take 1 tablet (40 mg total) by mouth daily.   PreserVision AREDS Caps Take 1 capsule by mouth daily.   vitamin B-12 500 MCG tablet Commonly known as: CYANOCOBALAMIN Take 500 mcg by mouth daily.           Objective:   Physical Exam BP (!) 149/72 (BP Location: Left Arm, Patient Position: Sitting, Cuff Size: Small)   Pulse 69   Temp 98.1 F (36.7 C) (Oral)   Resp 16   Ht 5\' 10"  (1.778 m)   Wt 161 lb (73 kg)   SpO2 95%   BMI 23.10 kg/m  General:   Well developed, NAD, BMI noted.  HEENT:  Normocephalic . Face symmetric, atraumatic Lungs:  CTA B Normal respiratory effort, no intercostal retractions, no accessory muscle use. Heart: RRR,  no murmur.  no pretibial edema bilaterally  Abdomen:  Not distended, soft, non-tender. No rebound or rigidity.  No CVA tenderness. DRE: Prostate is moderately enlarged, not nodular or tender. Skin: Not pale. Not jaundice Neurologic:  alert & oriented X3.  Speech normal, gait appropriate for age and unassisted Psych--  Cognition and judgment appear intact.  Cooperative with normal attention span and concentration.  Behavior appropriate. No anxious or depressed appearing.     Assessment     Assessment   HTN GERD RBBB MSK: --Chronic back pain -- Vertebroplasty 2001 -- Osteoporosis T score -3.3 (2014), declined all meds 2014 and  2019 GU: ---BPH ---ED ---Gross hematuria 2015  Initial CT show a question of urothelial cancer, subsequently a ureteroscopy was done and reportedly normal,  subsequently a biopsy was negative. Follow-up MRI 01-2014 show improvement. Saw urology ~ 04-2015 Urology check PSAs Exercise stress test 2014 negative Bee sting allergies: has a Epi pen  PLAN: Recurrent UTI Was seen 11/04/2018 with a Klebsiella UTI, partially treated, he was prescribed Bactrim and eventually got completely asymptomatic. Symptoms resurface around 11/23/2018, went to urgent care, urine culture showed Klebsiella again.  He was shot  of Rocephin and Keflex.He is now completely asymptomatic. DRE does not support prostatitis but BPH. Plan: PSA, UA, urine culture, referred to urology  HTN: BP slightly elevated today, recommend ambulatory BPs.  Continue w/ losartan.  Check CMP and CBC Allergies: As described above, recommend daily Claritin, hydrocortisone 2.5%, if not better will call and will refer to an allergist Preventive care: Strongly recommend a flu shot this year RTC CPX 4 months

## 2018-12-22 LAB — URINALYSIS, ROUTINE W REFLEX MICROSCOPIC
Bilirubin Urine: NEGATIVE
Nitrite: NEGATIVE
RBC / HPF: NONE SEEN (ref 0–?)
Specific Gravity, Urine: 1.025 (ref 1.000–1.030)
Total Protein, Urine: NEGATIVE
Urine Glucose: NEGATIVE
Urobilinogen, UA: 0.2 (ref 0.0–1.0)
pH: 5.5 (ref 5.0–8.0)

## 2018-12-22 LAB — CBC WITH DIFFERENTIAL/PLATELET
Basophils Absolute: 0.1 10*3/uL (ref 0.0–0.1)
Basophils Relative: 0.9 % (ref 0.0–3.0)
Eosinophils Absolute: 0.1 10*3/uL (ref 0.0–0.7)
Eosinophils Relative: 1.2 % (ref 0.0–5.0)
HCT: 42.2 % (ref 39.0–52.0)
Hemoglobin: 14.1 g/dL (ref 13.0–17.0)
Lymphocytes Relative: 38.9 % (ref 12.0–46.0)
Lymphs Abs: 2.8 10*3/uL (ref 0.7–4.0)
MCHC: 33.3 g/dL (ref 30.0–36.0)
MCV: 95.5 fl (ref 78.0–100.0)
Monocytes Absolute: 0.6 10*3/uL (ref 0.1–1.0)
Monocytes Relative: 9 % (ref 3.0–12.0)
Neutro Abs: 3.6 10*3/uL (ref 1.4–7.7)
Neutrophils Relative %: 50 % (ref 43.0–77.0)
Platelets: 204 10*3/uL (ref 150.0–400.0)
RBC: 4.42 Mil/uL (ref 4.22–5.81)
RDW: 14 % (ref 11.5–15.5)
WBC: 7.2 10*3/uL (ref 4.0–10.5)

## 2018-12-22 LAB — URINE CULTURE
MICRO NUMBER:: 645972
Result:: NO GROWTH
SPECIMEN QUALITY:: ADEQUATE

## 2018-12-22 LAB — COMPREHENSIVE METABOLIC PANEL
ALT: 15 U/L (ref 0–53)
AST: 16 U/L (ref 0–37)
Albumin: 4 g/dL (ref 3.5–5.2)
Alkaline Phosphatase: 63 U/L (ref 39–117)
BUN: 19 mg/dL (ref 6–23)
CO2: 29 mEq/L (ref 19–32)
Calcium: 9 mg/dL (ref 8.4–10.5)
Chloride: 105 mEq/L (ref 96–112)
Creatinine, Ser: 1.11 mg/dL (ref 0.40–1.50)
GFR: 62.73 mL/min (ref >60.00)
Glucose, Bld: 98 mg/dL (ref 70–99)
Potassium: 4.4 mEq/L (ref 3.5–5.1)
Sodium: 141 mEq/L (ref 135–145)
Total Bilirubin: 0.6 mg/dL (ref 0.2–1.2)
Total Protein: 6.4 g/dL (ref 6.0–8.3)

## 2018-12-22 LAB — PSA: PSA: 3.69 ng/mL (ref 0.10–4.00)

## 2018-12-22 LAB — HEMOGLOBIN A1C: Hgb A1c MFr Bld: 6 % (ref 4.6–6.5)

## 2018-12-22 NOTE — Assessment & Plan Note (Signed)
Recurrent UTI Was seen 11/04/2018 with a Klebsiella UTI, partially treated, he was prescribed Bactrim and eventually got completely asymptomatic. Symptoms resurface around 11/23/2018, went to urgent care, urine culture showed Klebsiella again.  He was shot of Rocephin and Keflex.He is now completely asymptomatic. DRE does not support prostatitis but BPH. Plan: PSA, UA, urine culture, referred to urology  HTN: BP slightly elevated today, recommend ambulatory BPs.  Continue w/ losartan.  Check CMP and CBC Allergies: As described above, recommend daily Claritin, hydrocortisone 2.5%, if not better will call and will refer to an allergist Preventive care: Strongly recommend a flu shot this year RTC CPX 4 months

## 2018-12-27 DIAGNOSIS — R69 Illness, unspecified: Secondary | ICD-10-CM | POA: Diagnosis not present

## 2019-01-17 DIAGNOSIS — N401 Enlarged prostate with lower urinary tract symptoms: Secondary | ICD-10-CM | POA: Diagnosis not present

## 2019-01-17 DIAGNOSIS — R351 Nocturia: Secondary | ICD-10-CM | POA: Diagnosis not present

## 2019-01-17 DIAGNOSIS — N3020 Other chronic cystitis without hematuria: Secondary | ICD-10-CM | POA: Diagnosis not present

## 2019-01-26 DIAGNOSIS — N281 Cyst of kidney, acquired: Secondary | ICD-10-CM | POA: Diagnosis not present

## 2019-01-26 DIAGNOSIS — N3020 Other chronic cystitis without hematuria: Secondary | ICD-10-CM | POA: Diagnosis not present

## 2019-02-22 DIAGNOSIS — R69 Illness, unspecified: Secondary | ICD-10-CM | POA: Diagnosis not present

## 2019-03-24 ENCOUNTER — Other Ambulatory Visit: Payer: Self-pay | Admitting: Internal Medicine

## 2019-04-11 ENCOUNTER — Telehealth: Payer: Self-pay | Admitting: Internal Medicine

## 2019-04-11 NOTE — Telephone Encounter (Signed)
Attempted to call patient to schedule Annual Wellness Visit, but patient did not answer. Will try to call patient again at a later time. SF °

## 2019-04-12 ENCOUNTER — Other Ambulatory Visit: Payer: Self-pay

## 2019-04-12 DIAGNOSIS — Z20822 Contact with and (suspected) exposure to covid-19: Secondary | ICD-10-CM

## 2019-04-13 LAB — NOVEL CORONAVIRUS, NAA: SARS-CoV-2, NAA: NOT DETECTED

## 2019-04-24 ENCOUNTER — Encounter: Payer: Self-pay | Admitting: Internal Medicine

## 2019-04-24 ENCOUNTER — Other Ambulatory Visit: Payer: Self-pay

## 2019-04-24 ENCOUNTER — Ambulatory Visit (INDEPENDENT_AMBULATORY_CARE_PROVIDER_SITE_OTHER): Payer: Medicare HMO | Admitting: Internal Medicine

## 2019-04-24 VITALS — BP 150/78 | HR 66 | Temp 97.9°F | Resp 16 | Ht 70.0 in | Wt 162.0 lb

## 2019-04-24 DIAGNOSIS — N390 Urinary tract infection, site not specified: Secondary | ICD-10-CM

## 2019-04-24 DIAGNOSIS — I10 Essential (primary) hypertension: Secondary | ICD-10-CM | POA: Diagnosis not present

## 2019-04-24 LAB — BASIC METABOLIC PANEL
BUN: 16 mg/dL (ref 6–23)
CO2: 30 mEq/L (ref 19–32)
Calcium: 9.1 mg/dL (ref 8.4–10.5)
Chloride: 105 mEq/L (ref 96–112)
Creatinine, Ser: 1.25 mg/dL (ref 0.40–1.50)
GFR: 54.66 mL/min — ABNORMAL LOW (ref >60.00)
Glucose, Bld: 103 mg/dL — ABNORMAL HIGH (ref 70–99)
Potassium: 4.1 mEq/L (ref 3.5–5.1)
Sodium: 142 mEq/L (ref 135–145)

## 2019-04-24 NOTE — Progress Notes (Signed)
Recurrent UTI  UA abnormal 12/2018 See urologist? Saw urology     HTN amb BP? Under 140/ mid 70s Once a week Says he thinks it is only elevated bc of doctors No headaches, visual changes, edema   Allergies  Claritin did not help Anti-itch cream bought over the counter

## 2019-04-24 NOTE — Progress Notes (Signed)
Subjective:    Patient ID: Cory Zavala, male    DOB: 1930/10/01, 85 y.o.   MRN: [id number]  DOS:  04/24/2019 Type of visit - description: Follow-up Recurrent UTIs: Saw urology, note reviewed. HTN: Good compliance with medications, ambulatory BPs always less than 140/75 Allergies: On OTC cream which helps.   BP Readings from Last 3 Encounters:  04/24/19 (!) 150/78  12/21/18 (!) 149/72  12/01/18 (!) 139/99    Review of Systems  Denies chest pain or difficulty breathing No lower extremity edema No headaches  Past Medical History:  Diagnosis Date  . Back pain, chronic   . BPH (benign prostatic hypertrophy)   . Diverticulosis of colon    LEFT  . ED (erectile dysfunction)   . GERD (gastroesophageal reflux disease) 09/15/2012  . Gross hematuria    2015  . HTN (hypertension)   . Nocturia   . Normal cardiac stress test    PER CARDIOLOGIST NOTE, DR NISHAN, APPROX.  1994  . Osteoporosis   . RBBB     Past Surgical History:  Procedure Laterality Date  . CATARACT EXTRACTION W/ INTRAOCULAR LENS  IMPLANT, BILATERAL  2010  . CYSTOSCOPY WITH RETROGRADE PYELOGRAM, URETEROSCOPY AND STENT PLACEMENT Right 12/25/2013   Procedure: CYSTOSCOPY WITH RIGHT RETROGRADE PYELOGRAM, RIGHT URETEROSCOPY  BIOPSY AND STENT: BLADDER BIOPSY;  Surgeon: Claybon Jabs, MD;  Location: [facility];  Service: Urology;  Laterality: Right;  Marland Kitchen VERTEBROPLASTY  2001    Social History   Socioeconomic History  . Marital status: Married    Spouse name: Not on file  . Number of children: 2  . Years of education: Not on file  . Highest education level: Not on file  Occupational History  . Occupation: Theme park manager, semi retired     Fish farm manager: RETIRED  Social Needs  . Financial resource strain: Not on file  . Food insecurity    Worry: Not on file    Inability: Not on file  . Transportation needs    Medical: Not on file    Non-medical: Not on file  Tobacco Use  . Smoking status: Never Smoker  .  Smokeless tobacco: Never Used  Substance and Sexual Activity  . Alcohol use: No  . Drug use: No  . Sexual activity: Not on file  Lifestyle  . Physical activity    Days per week: Not on file    Minutes per session: Not on file  . Stress: Not on file  Relationships  . Social Herbalist on phone: Not on file    Gets together: Not on file    Attends religious service: Not on file    Active member of club or organization: Not on file    Attends meetings of clubs or organizations: Not on file    Relationship status: Not on file  . Intimate partner violence    Fear of current or ex partner: Not on file    Emotionally abused: Not on file    Physically abused: Not on file    Forced sexual activity: Not on file  Other Topics Concern  . Not on file  Social History Narrative   Retired, patient was a Company secretary still preachs, Educational psychologist   Married > 33 years, lost wife aprox 5-11. remarried            Allergies as of 04/24/2019      Reactions   Bee Venom Anaphylaxis      Medication List  Accurate as of April 24, 2019 11:59 PM. If you have any questions, ask your nurse or doctor.        STOP taking these medications   aspirin 81 MG tablet Stopped by: Kathlene November, MD     TAKE these medications   D3 ADULT PO Take by mouth.   EPINEPHrine 0.3 mg/0.3 mL Soaj injection Commonly known as: EPI-PEN INJECT 0.3MLS(CONTENTS OF 1 SYRINGE) INTO THE MUSCLE AS DIRECTED   hydrocortisone 2.5 % cream Apply topically 2 (two) times daily.   losartan 50 MG tablet Commonly known as: COZAAR Take 1 tablet (50 mg total) by mouth daily.   multivitamin tablet Take 1 tablet by mouth daily.   OMEGA-3 FATTY ACIDS PO Take 1 tablet by mouth daily.   OSTEO ADVANCE PO Take 4 tablets by mouth daily. OSTEO GOLD (BRAND)   pantoprazole 40 MG tablet Commonly known as: PROTONIX Take 1 tablet (40 mg total) by mouth daily.   PreserVision AREDS Caps Take 1 capsule by mouth daily.    vitamin B-12 500 MCG tablet Commonly known as: CYANOCOBALAMIN Take 500 mcg by mouth daily.           Objective:   Physical Exam BP (!) 150/78 (BP Location: Left Arm, Patient Position: Sitting, Cuff Size: Small)   Pulse 66   Temp 97.9 F (36.6 C) (Temporal)   Resp 16   Ht 5\' 10"  (1.778 m)   Wt 162 lb (73.5 kg)   SpO2 96%   BMI 23.24 kg/m   General:   Well developed, NAD, BMI noted.  HEENT:  Normocephalic . Face symmetric, atraumatic Lungs:  CTA B Normal respiratory effort, no intercostal retractions, no accessory muscle use. Heart: RRR,  no murmur.  no pretibial edema bilaterally  Abdomen:  Not distended, soft, non-tender. No rebound or rigidity. No mass, no bruit Skin: Not pale. Not jaundice Neurologic:  alert & oriented X3.  Speech normal, gait appropriate for age and unassisted Psych--  Cognition and judgment appear intact.  Cooperative with normal attention span and concentration.  Behavior appropriate. No anxious or depressed appearing.     Assessment      Assessment   HTN GERD RBBB MSK: --Chronic back pain -- Vertebroplasty 2001 -- Osteoporosis T score -3.3 (2014), declined all meds 2014 and  2019 GU: ---BPH ---ED ---Gross hematuria 2015  Initial CT show a question of urothelial cancer, subsequently a ureteroscopy was done and reportedly normal, subsequently a biopsy was negative. Follow-up MRI 01-2014 show improvement. Saw urology ~ 04-2015 Urology check PSAs Exercise stress test 2014 negative Bee sting allergies: has a Epi pen  PLAN: HTN: At home is consistently less than 140/75, slightly elevated here today. No change for now, continue losartan, check a BMP BPH/recurrent UTI: Currently asymptomatic, saw urology, he was doing well, no further evaluation was suggested Allergies: Claritin did not help, currently doing well with OTC cream Preventive care: Per current guidelines, okay to stop aspirin Had a flu shot RTC 4 to 5 months CPX

## 2019-04-24 NOTE — Patient Instructions (Addendum)
Please schedule Medicare Wellness with Glenard Haring.   GO TO THE LAB : Get the blood work     GO TO THE FRONT DESK Schedule your next appointment   for a physical exam in 4 to 5 months   Continue checking your blood pressures  Stop aspirin

## 2019-04-24 NOTE — Progress Notes (Signed)
Pre visit review using our clinic review tool, if applicable. No additional management support is needed unless otherwise documented below in the visit note. 

## 2019-04-25 NOTE — Assessment & Plan Note (Addendum)
HTN: At home is consistently less than 140/75, slightly elevated here today. No change for now, continue losartan, check a BMP BPH/recurrent UTI: Currently asymptomatic, saw urology, he was doing well, no further evaluation was suggested Allergies: Claritin did not help, currently doing well with OTC cream Preventive care: Per current guidelines, okay to stop aspirin Had a flu shot RTC 4 to 5 months CPX

## 2019-07-07 ENCOUNTER — Ambulatory Visit: Payer: Medicare HMO | Attending: Internal Medicine

## 2019-07-07 DIAGNOSIS — Z23 Encounter for immunization: Secondary | ICD-10-CM | POA: Insufficient documentation

## 2019-07-07 NOTE — Progress Notes (Signed)
   Covid-19 Vaccination Clinic  Name:  Cory Zavala    MRN: [id number] DOB: March 23, 1931  07/07/2019  Mr. Cory Zavala was observed post Covid-19 immunization for 15 minutes without incidence. He was provided with Vaccine Information Sheet and instruction to access the V-Safe system.   Mr. Cory Zavala was instructed to call 911 with any severe reactions post vaccine: Marland Kitchen Difficulty breathing  . Swelling of your face and throat  . A fast heartbeat  . A bad rash all over your body  . Dizziness and weakness    Immunizations Administered    Name Date Dose VIS Date Route   Pfizer COVID-19 Vaccine 07/07/2019  5:43 PM 0.3 mL 05/26/2019 Intramuscular   Manufacturer: [place]   Lot: [id number]   [place]: [id number]

## 2019-07-28 ENCOUNTER — Ambulatory Visit: Payer: Medicare HMO | Attending: Internal Medicine

## 2019-07-28 DIAGNOSIS — Z23 Encounter for immunization: Secondary | ICD-10-CM | POA: Insufficient documentation

## 2019-07-28 NOTE — Progress Notes (Signed)
   Covid-19 Vaccination Clinic  Name:  Cory Zavala    MRN: [id number] DOB: 01/16/1931  07/28/2019  Mr. [name] was observed post Covid-19 immunization for 15 minutes without incidence. He was provided with Vaccine Information Sheet and instruction to access the V-Safe system.   Mr. Bent was instructed to call 911 with any severe reactions post vaccine: Marland Kitchen Difficulty breathing  . Swelling of your face and throat  . A fast heartbeat  . A bad rash all over your body  . Dizziness and weakness    Immunizations Administered    Name Date Dose VIS Date Route   Pfizer COVID-19 Vaccine 07/28/2019  1:53 PM 0.3 mL 05/26/2019 Intramuscular   Manufacturer: [place]   Lot: [id number]   [place]: [id number]

## 2019-09-21 ENCOUNTER — Other Ambulatory Visit: Payer: Self-pay

## 2019-09-22 ENCOUNTER — Encounter: Payer: Self-pay | Admitting: Internal Medicine

## 2019-09-22 ENCOUNTER — Ambulatory Visit (INDEPENDENT_AMBULATORY_CARE_PROVIDER_SITE_OTHER): Payer: Medicare HMO | Admitting: Internal Medicine

## 2019-09-22 ENCOUNTER — Other Ambulatory Visit: Payer: Self-pay

## 2019-09-22 VITALS — BP 155/75 | HR 63 | Temp 97.9°F | Resp 18 | Ht 70.0 in | Wt 164.4 lb

## 2019-09-22 DIAGNOSIS — Z0000 Encounter for general adult medical examination without abnormal findings: Secondary | ICD-10-CM

## 2019-09-22 DIAGNOSIS — R739 Hyperglycemia, unspecified: Secondary | ICD-10-CM

## 2019-09-22 DIAGNOSIS — I10 Essential (primary) hypertension: Secondary | ICD-10-CM | POA: Diagnosis not present

## 2019-09-22 DIAGNOSIS — L509 Urticaria, unspecified: Secondary | ICD-10-CM | POA: Diagnosis not present

## 2019-09-22 DIAGNOSIS — R42 Dizziness and giddiness: Secondary | ICD-10-CM | POA: Diagnosis not present

## 2019-09-22 LAB — CBC WITH DIFFERENTIAL/PLATELET
Basophils Absolute: 0 10*3/uL (ref 0.0–0.1)
Basophils Relative: 0.4 % (ref 0.0–3.0)
Eosinophils Absolute: 0.1 10*3/uL (ref 0.0–0.7)
Eosinophils Relative: 1.1 % (ref 0.0–5.0)
HCT: 41.8 % (ref 39.0–52.0)
Hemoglobin: 14.1 g/dL (ref 13.0–17.0)
Lymphocytes Relative: 31.2 % (ref 12.0–46.0)
Lymphs Abs: 2 10*3/uL (ref 0.7–4.0)
MCHC: 33.8 g/dL (ref 30.0–36.0)
MCV: 95.5 fl (ref 78.0–100.0)
Monocytes Absolute: 0.6 10*3/uL (ref 0.1–1.0)
Monocytes Relative: 8.8 % (ref 3.0–12.0)
Neutro Abs: 3.8 10*3/uL (ref 1.4–7.7)
Neutrophils Relative %: 58.5 % (ref 43.0–77.0)
Platelets: 187 10*3/uL (ref 150.0–400.0)
RBC: 4.38 Mil/uL (ref 4.22–5.81)
RDW: 13.3 % (ref 11.5–15.5)
WBC: 6.4 10*3/uL (ref 4.0–10.5)

## 2019-09-22 LAB — COMPREHENSIVE METABOLIC PANEL
ALT: 13 U/L (ref 0–53)
AST: 13 U/L (ref 0–37)
Albumin: 3.9 g/dL (ref 3.5–5.2)
Alkaline Phosphatase: 66 U/L (ref 39–117)
BUN: 20 mg/dL (ref 6–23)
CO2: 29 mEq/L (ref 19–32)
Calcium: 9 mg/dL (ref 8.4–10.5)
Chloride: 107 mEq/L (ref 96–112)
Creatinine, Ser: 1.16 mg/dL (ref 0.40–1.50)
GFR: 59.52 mL/min — ABNORMAL LOW (ref >60.00)
Glucose, Bld: 121 mg/dL — ABNORMAL HIGH (ref 70–99)
Potassium: 4.3 mEq/L (ref 3.5–5.1)
Sodium: 142 mEq/L (ref 135–145)
Total Bilirubin: 0.7 mg/dL (ref 0.2–1.2)
Total Protein: 5.9 g/dL — ABNORMAL LOW (ref 6.0–8.3)

## 2019-09-22 LAB — LIPID PANEL
Cholesterol: 145 mg/dL (ref 0–200)
HDL: 44.6 mg/dL (ref >39.00)
LDL Cholesterol: 82 mg/dL (ref 0–99)
NonHDL: 100.69
Total CHOL/HDL Ratio: 3
Triglycerides: 94 mg/dL (ref 0.0–149.0)
VLDL: 18.8 mg/dL (ref 0.0–40.0)

## 2019-09-22 LAB — HEMOGLOBIN A1C: Hgb A1c MFr Bld: 5.9 % (ref 4.6–6.5)

## 2019-09-22 NOTE — Progress Notes (Signed)
Subjective:    Patient ID: Cory Zavala, male    DOB: 09-Aug-1930, 86 y.o.   MRN: [id number]  DOS:  09/22/2019 Type of visit - description: Here for CPX We also discussed other issues Had his second Covid shot 08/07/2019, he was quite fatigued for 2 weeks, now fatigue has decreased.  1 week history of dizziness, it has happened at night when he stands up to go to the bathroom, described as spinning. No associated nausea, chest pain, palpitations or feeling faint. After he goes to bed, symptoms go away in few minutes.  Also, having a rash on and off at different places. + Itching, no blisters, denies tongue or lip swelling.   Review of Systems  Other than above, a 14 point review of systems is negative    Past Medical History:  Diagnosis Date  . Back pain, chronic   . BPH (benign prostatic hypertrophy)   . Diverticulosis of colon    LEFT  . ED (erectile dysfunction)   . GERD (gastroesophageal reflux disease) 09/15/2012  . Gross hematuria    2015  . HTN (hypertension)   . Nocturia   . Normal cardiac stress test    PER CARDIOLOGIST NOTE, DR NISHAN, APPROX.  1994  . Osteoporosis   . RBBB     Past Surgical History:  Procedure Laterality Date  . CATARACT EXTRACTION W/ INTRAOCULAR LENS  IMPLANT, BILATERAL  2010  . CYSTOSCOPY WITH RETROGRADE PYELOGRAM, URETEROSCOPY AND STENT PLACEMENT Right 12/25/2013   Procedure: CYSTOSCOPY WITH RIGHT RETROGRADE PYELOGRAM, RIGHT URETEROSCOPY  BIOPSY AND STENT: BLADDER BIOPSY;  Surgeon: Claybon Jabs, MD;  Location: [facility];  Service: Urology;  Laterality: Right;  Marland Kitchen VERTEBROPLASTY  2001   Family History  Problem Relation Age of Onset  . CAD Neg Hx   . Diabetes Neg Hx   . Colon cancer Neg Hx   . Prostate cancer Neg Hx     Allergies as of 09/22/2019      Reactions   Bee Venom Anaphylaxis      Medication List       Accurate as of September 22, 2019 11:59 PM. If you have any questions, ask your nurse or doctor.          D3 ADULT PO Take by mouth.   EPINEPHrine 0.3 mg/0.3 mL Soaj injection Commonly known as: EPI-PEN INJECT 0.3MLS(CONTENTS OF 1 SYRINGE) INTO THE MUSCLE AS DIRECTED   hydrocortisone 2.5 % cream Apply topically 2 (two) times daily.   losartan 50 MG tablet Commonly known as: COZAAR Take 1 tablet (50 mg total) by mouth daily.   multivitamin tablet Take 1 tablet by mouth daily.   OMEGA-3 FATTY ACIDS PO Take 1 tablet by mouth daily.   OSTEO ADVANCE PO Take 4 tablets by mouth daily. OSTEO GOLD (BRAND)   pantoprazole 40 MG tablet Commonly known as: PROTONIX Take 1 tablet (40 mg total) by mouth daily.   PreserVision AREDS Caps Take 1 capsule by mouth daily.   vitamin B-12 500 MCG tablet Commonly known as: CYANOCOBALAMIN Take 500 mcg by mouth daily.          Objective:   Physical Exam BP (!) 155/75 (BP Location: Left Arm, Patient Position: Sitting, Cuff Size: Small)   Pulse 63   Temp 97.9 F (36.6 C) (Temporal)   Resp 18   Ht 5\' 10"  (1.778 m)   Wt 164 lb 6 oz (74.6 kg)   SpO2 98%   BMI 23.59 kg/m  General: Well developed, NAD, BMI noted Neck: No  thyromegaly.  Normal carotid pulses, no bruit HEENT:  Normocephalic . Face symmetric, atraumatic Lungs:  CTA B Normal respiratory effort, no intercostal retractions, no accessory muscle use. Heart: RRR,  no murmur.  Abdomen:  Not distended, soft, non-tender. No rebound or rigidity.   Lower extremities: no pretibial edema bilaterally  Skin: Exposed areas without rash. Not pale. Not jaundice Neurologic:  alert & oriented X3.  Speech normal, gait appropriate for age and unassisted Strength symmetric and appropriate for age.  DTR symmetric.  EOMI, pupils small but symmetric and reactive Psych: Cognition and judgment appear intact.  Cooperative with normal attention span and concentration.  Behavior appropriate. No anxious or depressed appearing.     Assessment      Assessment   HTN GERD RBBB MSK: --Chronic  back pain -- Vertebroplasty 2001 -- Osteoporosis T score -3.3 (2014), declined all meds 2014 and  2019 GU: ---BPH ---ED ---Gross hematuria 2015  Initial CT show a question of urothelial cancer, subsequently a ureteroscopy was done and reportedly normal, subsequently a biopsy was negative. Follow-up MRI 01-2014 show improvement. Saw urology ~ 04-2015 Urology check PSAs Exercise stress test 2014 negative Bee sting allergies: has a Epi pen   PLAN: Here for CPX; We also discussed the following: Hyperglycemia: Check A1c HTN: On losartan, BP today slightly elevated, ambulatory BPs reviewed all are very good.  No change, check a CMP, FLP. BPH: Currently with no symptoms Dizziness: As described above, likely a peripheral issue, neuro exam normal, normal carotid pulses.  Recommend to get up slowly, always have a night light, call if symptoms increase or they become different or if he has associated stroke symptoms. Rash: The patient has on and off rash the last few hours,+ pruritic, he showed me a picture and seems to be a flat/red rash.  Possibly hives.  Refer to allergy. RTC 6 months   This visit occurred during the SARS-CoV-2 public health emergency.  Safety protocols were in place, including screening questions prior to the visit, additional usage of staff PPE, and extensive cleaning of exam room while observing appropriate contact time as indicated for disinfecting solutions.

## 2019-09-22 NOTE — Progress Notes (Signed)
Pre visit review using our clinic review tool, if applicable. No additional management support is needed unless otherwise documented below in the visit note. 

## 2019-09-22 NOTE — Patient Instructions (Addendum)
Please schedule Medicare Wellness with Glenard Haring.  Continue checking your blood pressures weekly BP GOAL is between 110/65 and  135/85. If it is consistently higher or lower, let me know  We are referring you to a allergist  GO TO THE LAB : Get the blood work     [place], [place] back for a checkup in 6 months

## 2019-09-24 NOTE — Assessment & Plan Note (Signed)
-  Td 2013 [place] [facility] [id number], 05-2009 -prevnar 2016 - shingles shot-- 2010  -Shingrix: Advised to proceed at a pharmacy if so desired. -Had 2 Covid shots. -CCS:  Cscope 2004 per patient, was told normal  + iFOB 2013, was referred to GI but did not go, repeated test was (-) 2014.  Not interested on further CCS. -prostate ca screening:  see urology

## 2019-09-24 NOTE — Assessment & Plan Note (Signed)
Here for CPX We also discussed the following: Hyperglycemia: Check A1c HTN: On losartan, BP today slightly elevated, ambulatory BPs reviewed all are very good.  No change, check a CMP, FLP. BPH: Currently with no symptoms Dizziness: As described above, likely a peripheral issue, neuro exam normal, normal carotid pulses.  Recommend to get up slowly, always have a night light, call if symptoms increase or they become different or if he has associated stroke symptoms. Rash: The patient has on and off rash the last few hours,+ pruritic, he showed me a picture and seems to be a flat/red rash.  Possibly hives.  Refer to allergy. RTC 6 months

## 2019-10-12 ENCOUNTER — Other Ambulatory Visit: Payer: Self-pay | Admitting: Internal Medicine

## 2019-10-17 ENCOUNTER — Encounter: Payer: Self-pay | Admitting: Allergy and Immunology

## 2019-10-17 ENCOUNTER — Ambulatory Visit: Payer: Medicare HMO | Admitting: Allergy and Immunology

## 2019-10-17 ENCOUNTER — Other Ambulatory Visit: Payer: Self-pay

## 2019-10-17 VITALS — BP 110/74 | HR 71 | Temp 98.2°F | Resp 16 | Ht 70.0 in | Wt 165.8 lb

## 2019-10-17 DIAGNOSIS — L503 Dermatographic urticaria: Secondary | ICD-10-CM | POA: Diagnosis not present

## 2019-10-17 DIAGNOSIS — T782XXD Anaphylactic shock, unspecified, subsequent encounter: Secondary | ICD-10-CM

## 2019-10-17 DIAGNOSIS — L500 Allergic urticaria: Secondary | ICD-10-CM

## 2019-10-17 DIAGNOSIS — T63481D Toxic effect of venom of other arthropod, accidental (unintentional), subsequent encounter: Secondary | ICD-10-CM

## 2019-10-17 MED ORDER — LORATADINE 10 MG PO TABS: ORAL_TABLET | ORAL | 5 refills | Status: AC

## 2019-10-17 NOTE — Progress Notes (Signed)
[place] [facility] [place] [facility] [place] [facility] [place]   Dear Dr. Larose Kells,  Thank you for referring Cory Zavala to the [facility] on 10/17/2019.   Below is a summation of this patient's evaluation and recommendations.  Thank you for your referral. I will keep you informed about this patient's response to treatment.   If you have any questions please do not hesitate to contact me.   Sincerely,  Jiles Prows, MD Allergy / Immunology [place]   ______________________________________________________________________    NEW PATIENT NOTE  Referring Provider: Colon Branch, MD Primary Provider: Colon Branch, MD Date of office visit: 10/17/2019    Subjective:   Chief Complaint:  Cory Zavala (DOB: 1931-06-02) is a 86 y.o. male who presents to the clinic on 10/17/2019 with a chief complaint of Pruritus .     HPI: Cory Zavala presents to this clinic in evaluation of 2 issues.  First, he has developed red raised itchy lesions on his skin over the course of the past year that usually resolve within minutes if he does not disturb him and are not associated with hyperpigmentation or scarring.  He has no associated systemic or constitutional symptoms.  He has a predilection for developing these issues under his axilla and neck and groin.  If he scratches these issues they progress to develop a large patches of red areas that last hours and are intensely itchy.  These outbreaks occur about 3 times a day.  There is no obvious trigger giving rise to this issue.  He has not really noted any change in medication use, supplement use, environmental changes, or symptoms to suggest an ongoing systemic disease.  He is used over-the-counter cream to treat these outbreaks.  Second, he has a history of hymenoptera venom sensitivity.  Apparently back in the 1960s he was stung by a wasp and developed diffuse itchiness  and throat swelling and vomited.  3 years ago he was stung by a wasp and developed throat swelling and inability to swallow requiring emergency room evaluation and treatment.  Past Medical History:  Diagnosis Date  . Back pain, chronic   . BPH (benign prostatic hypertrophy)   . Diverticulosis of colon    LEFT  . ED (erectile dysfunction)   . GERD (gastroesophageal reflux disease) 09/15/2012  . Gross hematuria    2015  . HTN (hypertension)   . Nocturia   . Normal cardiac stress test    PER CARDIOLOGIST NOTE, DR NISHAN, APPROX.  1994  . Osteoporosis   . RBBB     Past Surgical History:  Procedure Laterality Date  . CATARACT EXTRACTION W/ INTRAOCULAR LENS  IMPLANT, BILATERAL  2010  . CYSTOSCOPY WITH RETROGRADE PYELOGRAM, URETEROSCOPY AND STENT PLACEMENT Right 12/25/2013   Procedure: CYSTOSCOPY WITH RIGHT RETROGRADE PYELOGRAM, RIGHT URETEROSCOPY  BIOPSY AND STENT: BLADDER BIOPSY;  Surgeon: Claybon Jabs, MD;  Location: [facility];  Service: Urology;  Laterality: Right;  Marland Kitchen VERTEBROPLASTY  2001    Allergies as of 10/17/2019      Reactions   Bee Venom Anaphylaxis      Medication List      D3 ADULT PO Take by mouth.   EPINEPHrine 0.3 mg/0.3 mL Soaj injection Commonly known as: EPI-PEN INJECT 0.3MLS(CONTENTS OF 1 SYRINGE) INTO THE MUSCLE AS DIRECTED   hydrocortisone 2.5 % cream Apply topically 2 (two) times daily.   losartan 50  MG tablet Commonly known as: COZAAR Take 1 tablet (50 mg total) by mouth daily.   multivitamin tablet Take 1 tablet by mouth daily.   OMEGA-3 FATTY ACIDS PO Take 1 tablet by mouth daily.   OSTEO ADVANCE PO Take 4 tablets by mouth daily. OSTEO GOLD (BRAND)   pantoprazole 40 MG tablet Commonly known as: PROTONIX Take 1 tablet (40 mg total) by mouth daily.   PreserVision AREDS Caps Take 1 capsule by mouth daily.   vitamin B-12 500 MCG tablet Commonly known as: CYANOCOBALAMIN Take 500 mcg by mouth daily.       Review of  systems negative except as noted in HPI / PMHx or noted below:  Review of Systems  Constitutional: Negative.   HENT: Negative.   Eyes: Negative.   Respiratory: Negative.   Cardiovascular: Negative.   Gastrointestinal: Negative.   Genitourinary: Negative.   Musculoskeletal: Negative.   Skin: Negative.   Neurological: Negative.   Endo/Heme/Allergies: Negative.   Psychiatric/Behavioral: Negative.     Family History  Problem Relation Age of Onset  . CAD Neg Hx   . Diabetes Neg Hx   . Colon cancer Neg Hx   . Prostate cancer Neg Hx   . Allergic rhinitis Neg Hx   . Angioedema Neg Hx   . Asthma Neg Hx   . Atopy Neg Hx   . Eczema Neg Hx   . Immunodeficiency Neg Hx   . Urticaria Neg Hx     Social History   Socioeconomic History  . Marital status: Married    Spouse name: Not on file  . Number of children: 2  . Years of education: Not on file  . Highest education level: Not on file  Occupational History  . Occupation: Theme park manager, semi retired     Fish farm manager: RETIRED  Tobacco Use  . Smoking status: Never Smoker  . Smokeless tobacco: Never Used  Substance and Sexual Activity  . Alcohol use: No  . Drug use: No  . Sexual activity: Not on file  Other Topics Concern  . Not on file  Social History Narrative   Retired, patient was a Company secretary still preachs, Educational psychologist   Married > 82 years, lost wife aprox 5-11. remarried          Programme researcher, broadcasting/film/video and Social history  Lives in a house with a dry environment, no animals located inside the household, no plastic on the bed or pillow, no smoking ongoing with inside the household.  Objective:   Vitals:   10/17/19 0929  BP: 110/74  Pulse: 71  Resp: 16  Temp: 98.2 F (36.8 C)  SpO2: 97%   Height: 5\' 10"  (177.8 cm) Weight: 165 lb 12.8 oz (75.2 kg)  Physical Exam Constitutional:      Appearance: He is not diaphoretic.  HENT:     Head: Normocephalic. No right periorbital erythema or left periorbital erythema.     Right Ear:  Tympanic membrane, ear canal and external ear normal.     Left Ear: Tympanic membrane, ear canal and external ear normal.     Nose: Nose normal. No mucosal edema or rhinorrhea.     Mouth/Throat:     Pharynx: Uvula midline. No oropharyngeal exudate.  Eyes:     General: Lids are normal.     Conjunctiva/sclera: Conjunctivae normal.     Pupils: Pupils are equal, round, and reactive to light.  Neck:     Thyroid: No thyromegaly.     Trachea: Trachea normal. No tracheal tenderness or  tracheal deviation.  Cardiovascular:     Rate and Rhythm: Normal rate and regular rhythm.     Heart sounds: Normal heart sounds, S1 normal and S2 normal. No murmur.  Pulmonary:     Effort: Pulmonary effort is normal. No respiratory distress.     Breath sounds: Normal breath sounds. No stridor. No wheezing or rales.  Chest:     Chest wall: No tenderness.  Abdominal:     General: There is no distension.     Palpations: Abdomen is soft. There is no mass.     Tenderness: There is no abdominal tenderness. There is no guarding or rebound.  Musculoskeletal:        General: No tenderness.  Lymphadenopathy:     Head:     Right side of head: No tonsillar adenopathy.     Left side of head: No tonsillar adenopathy.     Cervical: No cervical adenopathy.  Skin:    Coloration: Skin is not pale.     Findings: No erythema or rash.     Nails: There is no clubbing.  Neurological:     Mental Status: He is alert.     Diagnostics: Allergy skin tests were performed.  He did not demonstrate any hypersensitivity against a screening panel of aeroallergens or foods.  He had diffuse dermatographia.  Results of blood tests obtained 22 September 2019 identifies creatinine 1.16, AST 13 U/L, ALT 13 U/L, WBC 6.4, eosinophil 100, lymphocyte 2000, hemoglobin 14.1, platelet 187  Assessment and Plan:    1. Allergic urticaria   2. Dermatographic urticaria   3. Anaphylaxis due to hymenoptera venom, accidental or unintentional, subsequent  encounter     1.  Allergen avoidance measures?  2.  Loratadine 10 mg - 1-2 tablets 1-2 times per day (MAX=40mg /day)  3.  EpiPen, Benadryl, MD/ER evaluation for allergic reaction  4.  Blood -TSH, free T4, TP, hymenoptera venom panel  5.  Further evaluation treatment?  6.  Return to clinic in 4 weeks or earlier if problem  Cory Zavala has an overactive immune system with unknown etiologic factor and fortunately without any associated systemic or constitutional symptoms.  We will screen his blood for a systemic disease contributing to his immunological hyperreactivity and he can use a dose of antihistamine utilizing loratadine from 10 to 40 mg daily to control this issue.  As well, we will further work through the issue of his hymenoptera venom hypersensitivity history with the blood test noted above.  I will see him back in this clinic in 4 weeks or earlier if there is a problem.  Jiles Prows, MD Allergy / Immunology [facility]

## 2019-10-17 NOTE — Patient Instructions (Addendum)
  1.  Allergen avoidance measures?  2.  Loratadine 10 mg - 1-2 tablets 1-2 times per day (MAX=40mg /day)  3.  EpiPen, Benadryl, MD/ER evaluation for allergic reaction  4.  Blood -TSH, free T4, TP, hymenoptera venom panel  5.  Further evaluation treatment?  6.  Return to clinic in 4 weeks or earlier if problem

## 2019-10-18 ENCOUNTER — Encounter: Payer: Self-pay | Admitting: Allergy and Immunology

## 2019-10-21 LAB — ALLERGEN HYMENOPTERA PANEL
Bumblebee: <0.1 kU/L
Honeybee IgE: <0.1 kU/L
Hornet, White Face, IgE: <0.1 kU/L
Hornet, Yellow, IgE: <0.1 kU/L
Paper Wasp IgE: 0.14 kU/L — AB
Yellow Jacket, IgE: 1.09 kU/L — AB

## 2019-10-21 LAB — THYROID PEROXIDASE ANTIBODY: Thyroperoxidase Ab SerPl-aCnc: <9 IU/mL (ref 0–34)

## 2019-10-21 LAB — TSH: TSH: 1.56 u[IU]/mL (ref 0.450–4.500)

## 2019-10-21 LAB — T4, FREE: Free T4: 1.41 ng/dL (ref 0.82–1.77)

## 2019-11-14 ENCOUNTER — Ambulatory Visit (INDEPENDENT_AMBULATORY_CARE_PROVIDER_SITE_OTHER): Payer: Medicare HMO | Admitting: Allergy and Immunology

## 2019-11-14 ENCOUNTER — Encounter: Payer: Self-pay | Admitting: Allergy and Immunology

## 2019-11-14 ENCOUNTER — Other Ambulatory Visit: Payer: Self-pay

## 2019-11-14 VITALS — BP 118/74 | HR 76 | Temp 98.0°F | Resp 16

## 2019-11-14 DIAGNOSIS — T63481D Toxic effect of venom of other arthropod, accidental (unintentional), subsequent encounter: Secondary | ICD-10-CM

## 2019-11-14 DIAGNOSIS — T782XXD Anaphylactic shock, unspecified, subsequent encounter: Secondary | ICD-10-CM | POA: Diagnosis not present

## 2019-11-14 DIAGNOSIS — L501 Idiopathic urticaria: Secondary | ICD-10-CM

## 2019-11-14 NOTE — Patient Instructions (Signed)
  1.  Allergen avoidance measures - hymenoptera venom  2.  Loratadine 10 mg - 1-2 tablets 1-2 times per day (MAX=40mg /day)  3.  EpiPen, Benadryl, MD/ER evaluation for allergic reaction  4.  Consider a course of immunotherapy against hymenoptera venom  5.  Return to clinic in 6 months or earlier if problem

## 2019-11-14 NOTE — Progress Notes (Signed)
[facility] [facility] [place] [facility] [place]   Follow-up Note  Referring Provider: Colon Branch, MD Primary Provider: Colon Branch, MD Date of Office Visit: 11/14/2019  Subjective:   Cory Zavala (DOB: 28-Mar-1931) is a 86 y.o. male who returns to the [facility] on 11/14/2019 in re-evaluation of the following:  HPI: Cory Zavala presents to this clinic in evaluation of urticaria and a history of hymenoptera venom hypersensitivity reaction.  His last visit to this clinic was his initial evaluation of 17 Oct 2019.  He has resolved all of his urticaria while using Claritin on a consistent basis.  He is now down to using 10 mg daily.  He remains away from hymenoptera as best as possible.  He does have an injectable epinephrine device.  Allergies as of 11/14/2019      Reactions   Bee Venom Anaphylaxis      Medication List      D3 ADULT PO Take by mouth.   EPINEPHrine 0.3 mg/0.3 mL Soaj injection Commonly known as: EPI-PEN INJECT 0.3MLS(CONTENTS OF 1 SYRINGE) INTO THE MUSCLE AS DIRECTED   hydrocortisone 2.5 % cream Apply topically 2 (two) times daily.   loratadine 10 MG tablet Commonly known as: Claritin Take 1-2 tablets 1-2 times per day   losartan 50 MG tablet Commonly known as: COZAAR Take 1 tablet (50 mg total) by mouth daily.   multivitamin tablet Take 1 tablet by mouth daily.   OMEGA-3 FATTY ACIDS PO Take 1 tablet by mouth daily.   OSTEO ADVANCE PO Take 4 tablets by mouth daily. OSTEO GOLD (BRAND)   pantoprazole 40 MG tablet Commonly known as: PROTONIX Take 1 tablet (40 mg total) by mouth daily.   PreserVision AREDS Caps Take 1 capsule by mouth daily.   vitamin B-12 500 MCG tablet Commonly known as: CYANOCOBALAMIN Take 500 mcg by mouth daily.       Past Medical History:  Diagnosis Date  . Back pain, chronic   . BPH (benign prostatic hypertrophy)   . Diverticulosis of colon    LEFT  . ED (erectile dysfunction)   . GERD  (gastroesophageal reflux disease) 09/15/2012  . Gross hematuria    2015  . HTN (hypertension)   . Nocturia   . Normal cardiac stress test    PER CARDIOLOGIST NOTE, DR NISHAN, APPROX.  1994  . Osteoporosis   . RBBB     Past Surgical History:  Procedure Laterality Date  . CATARACT EXTRACTION W/ INTRAOCULAR LENS  IMPLANT, BILATERAL  2010  . CYSTOSCOPY WITH RETROGRADE PYELOGRAM, URETEROSCOPY AND STENT PLACEMENT Right 12/25/2013   Procedure: CYSTOSCOPY WITH RIGHT RETROGRADE PYELOGRAM, RIGHT URETEROSCOPY  BIOPSY AND STENT: BLADDER BIOPSY;  Surgeon: Claybon Jabs, MD;  Location: [facility];  Service: Urology;  Laterality: Right;  Marland Kitchen VERTEBROPLASTY  2001    Review of systems negative except as noted in HPI / PMHx or noted below:  Review of Systems  Constitutional: Negative.   HENT: Negative.   Eyes: Negative.   Respiratory: Negative.   Cardiovascular: Negative.   Gastrointestinal: Negative.   Genitourinary: Negative.   Musculoskeletal: Negative.   Skin: Negative.   Neurological: Negative.   Endo/Heme/Allergies: Negative.   Psychiatric/Behavioral: Negative.      Objective:   Vitals:   11/14/19 0852  BP: 118/74  Pulse: 76  Resp: 16  Temp: 98 F (36.7 C)  SpO2: 97%          Physical Exam Constitutional:  Appearance: He is not diaphoretic.  HENT:     Head: Normocephalic.     Right Ear: Tympanic membrane, ear canal and external ear normal.     Left Ear: Tympanic membrane, ear canal and external ear normal.     Nose: Nose normal. No mucosal edema or rhinorrhea.     Mouth/Throat:     Pharynx: Uvula midline. No oropharyngeal exudate.  Eyes:     Conjunctiva/sclera: Conjunctivae normal.  Neck:     Thyroid: No thyromegaly.     Trachea: Trachea normal. No tracheal tenderness or tracheal deviation.  Cardiovascular:     Rate and Rhythm: Normal rate and regular rhythm.     Heart sounds: Normal heart sounds, S1 normal and S2 normal. No murmur.  Pulmonary:      Effort: No respiratory distress.     Breath sounds: Normal breath sounds. No stridor. No wheezing or rales.  Lymphadenopathy:     Head:     Right side of head: No tonsillar adenopathy.     Left side of head: No tonsillar adenopathy.     Cervical: No cervical adenopathy.  Skin:    Findings: No erythema or rash.     Nails: There is no clubbing.  Neurological:     Mental Status: He is alert.     Diagnostics:   Results of blood tests obtained for May 2021 identified IgE antibodies directed against yellowjacket at 1.09 KU/L, paper wasp 0.14 KU/L, TSH 1.560 IU/mL, free T4 1.41 NG/DL, thyroid peroxidase antibody less than 9U/mL,  Assessment and Plan:   1. Idiopathic urticaria   2. Anaphylaxis due to hymenoptera venom, accidental or unintentional, subsequent encounter     1.  Allergen avoidance measures - hymenoptera venom  2.  Loratadine 10 mg - 1-2 tablets 1-2 times per day (MAX=40mg /day)  3.  EpiPen, Benadryl, MD/ER evaluation for allergic reaction  4.  Consider a course of immunotherapy against hymenoptera venom  5.  Return to clinic in 6 months or earlier if problem  Cory Zavala is really doing quite well on his current dose of antihistamine regarding control of his urticaria and he will remain on this dosage.  He does appear to have a history of hymenoptera venom hypersensitivity reaction and he does have IgE antibodies directed against hymenoptera and he would definitely be a candidate for immunotherapy.  He is presently considering this option.  I will see him back in this clinic in 6 months or earlier if there is a problem.  Allena Katz, MD Allergy / Immunology [place]

## 2019-11-15 ENCOUNTER — Encounter: Payer: Self-pay | Admitting: Allergy and Immunology

## 2019-12-11 DIAGNOSIS — H524 Presbyopia: Secondary | ICD-10-CM | POA: Diagnosis not present

## 2019-12-11 DIAGNOSIS — Z961 Presence of intraocular lens: Secondary | ICD-10-CM | POA: Diagnosis not present

## 2019-12-11 DIAGNOSIS — H02834 Dermatochalasis of left upper eyelid: Secondary | ICD-10-CM | POA: Diagnosis not present

## 2019-12-11 DIAGNOSIS — H43812 Vitreous degeneration, left eye: Secondary | ICD-10-CM | POA: Diagnosis not present

## 2019-12-11 DIAGNOSIS — H5211 Myopia, right eye: Secondary | ICD-10-CM | POA: Diagnosis not present

## 2019-12-11 DIAGNOSIS — H04123 Dry eye syndrome of bilateral lacrimal glands: Secondary | ICD-10-CM | POA: Diagnosis not present

## 2019-12-11 DIAGNOSIS — H02831 Dermatochalasis of right upper eyelid: Secondary | ICD-10-CM | POA: Diagnosis not present

## 2019-12-11 DIAGNOSIS — H5319 Other subjective visual disturbances: Secondary | ICD-10-CM | POA: Diagnosis not present

## 2019-12-12 ENCOUNTER — Telehealth: Payer: Self-pay

## 2019-12-12 NOTE — Telephone Encounter (Signed)
done

## 2019-12-12 NOTE — Telephone Encounter (Signed)
Form dropped off by Pt- placed in PCP red folder.

## 2019-12-13 DIAGNOSIS — M19012 Primary osteoarthritis, left shoulder: Secondary | ICD-10-CM | POA: Diagnosis not present

## 2019-12-13 NOTE — Telephone Encounter (Signed)
LMOM informing Pt that form ready for pick up at front desk at his convenience. Copy sent for scanning.

## 2019-12-20 DIAGNOSIS — R69 Illness, unspecified: Secondary | ICD-10-CM | POA: Diagnosis not present

## 2020-01-02 NOTE — Progress Notes (Signed)
Subjective:   Cory Zavala is a 86 y.o. male who presents for Medicare Annual (Subsequent) preventive examination.  Review of Systems    Cardiac Risk Factors include: advanced age (>31men, >71 women);hypertension;male gender     Objective:    Today's Vitals   01/03/20 1034  BP: 140/84  Pulse: 63  Temp: (!) 97 F (36.1 C)  TempSrc: Temporal  SpO2: 97%  Weight: 164 lb 9.6 oz (74.7 kg)  Height: 5\' 10"  (1.778 m)   Body mass index is 23.62 kg/m.  Advanced Directives 01/03/2020 06/25/2017 06/02/2016 12/25/2013  Does Patient Have a Medical Advance Directive? Yes No No Patient has advance directive, copy not in chart  Type of Advance Directive [place];Living will - - Living will  Does patient want to make changes to medical advance directive? No - Patient declined - - No change requested  Copy of [place] in Chart? No - copy requested - - -  Would patient like information on creating a medical advance directive? - No - Patient declined Yes (MAU/Ambulatory/Procedural Areas - Information given) -    Current Medications (verified) Outpatient Encounter Medications as of 01/03/2020  Medication Sig  . Cholecalciferol (D3 ADULT PO) Take by mouth.  . loratadine (CLARITIN) 10 MG tablet Take 1-2 tablets 1-2 times per day  . losartan (COZAAR) 50 MG tablet Take 1 tablet (50 mg total) by mouth daily.  . Multiple Vitamin (MULTIVITAMIN) tablet Take 1 tablet by mouth daily.  . Multiple Vitamins-Minerals (PRESERVISION AREDS) CAPS Take 1 capsule by mouth daily.  . Nutritional Supplements (OSTEO ADVANCE PO) Take 4 tablets by mouth daily. OSTEO GOLD (BRAND)  . OMEGA-3 FATTY ACIDS PO Take 1 tablet by mouth daily.  . pantoprazole (PROTONIX) 40 MG tablet Take 1 tablet (40 mg total) by mouth daily.  . vitamin B-12 (CYANOCOBALAMIN) 500 MCG tablet Take 500 mcg by mouth daily.  Marland Kitchen EPINEPHrine 0.3 mg/0.3 mL IJ SOAJ injection INJECT 0.3MLS(CONTENTS OF 1 SYRINGE) INTO  THE MUSCLE AS DIRECTED (Patient not taking: Reported on 01/03/2020)   No facility-administered encounter medications on file as of 01/03/2020.    Allergies (verified) Bee venom   History: Past Medical History:  Diagnosis Date  . Back pain, chronic   . BPH (benign prostatic hypertrophy)   . Diverticulosis of colon    LEFT  . ED (erectile dysfunction)   . GERD (gastroesophageal reflux disease) 09/15/2012  . Gross hematuria    2015  . HTN (hypertension)   . Nocturia   . Normal cardiac stress test    PER CARDIOLOGIST NOTE, DR NISHAN, APPROX.  1994  . Osteoporosis   . RBBB    Past Surgical History:  Procedure Laterality Date  . CATARACT EXTRACTION W/ INTRAOCULAR LENS  IMPLANT, BILATERAL  2010  . CYSTOSCOPY WITH RETROGRADE PYELOGRAM, URETEROSCOPY AND STENT PLACEMENT Right 12/25/2013   Procedure: CYSTOSCOPY WITH RIGHT RETROGRADE PYELOGRAM, RIGHT URETEROSCOPY  BIOPSY AND STENT: BLADDER BIOPSY;  Surgeon: Claybon Jabs, MD;  Location: [place];  Service: Urology;  Laterality: Right;  Marland Kitchen VERTEBROPLASTY  2001   Family History  Problem Relation Age of Onset  . CAD Neg Hx   . Diabetes Neg Hx   . Colon cancer Neg Hx   . Prostate cancer Neg Hx   . Allergic rhinitis Neg Hx   . Angioedema Neg Hx   . Asthma Neg Hx   . Atopy Neg Hx   . Eczema Neg Hx   . Immunodeficiency  Neg Hx   . Urticaria Neg Hx    Social History   Socioeconomic History  . Marital status: Married    Spouse name: Not on file  . Number of children: 2  . Years of education: Not on file  . Highest education level: Not on file  Occupational History  . Occupation: Theme park manager, semi retired     Fish farm manager: RETIRED  Tobacco Use  . Smoking status: Never Smoker  . Smokeless tobacco: Never Used  Substance and Sexual Activity  . Alcohol use: No  . Drug use: No  . Sexual activity: Not on file  Other Topics Concern  . Not on file  Social History Narrative   Retired, patient was a Company secretary still preachs,  Educational psychologist   Married > 80 years, lost wife aprox 5-11. remarried         Social Determinants of Radio broadcast assistant Strain:   . Difficulty of Paying Living Expenses:   Food Insecurity:   . Worried About Charity fundraiser in the Last Year:   . Arboriculturist in the Last Year:   Transportation Needs:   . Film/video editor (Medical):   Marland Kitchen Lack of Transportation (Non-Medical):   Physical Activity:   . Days of Exercise per Week:   . Minutes of Exercise per Session:   Stress:   . Feeling of Stress :   Social Connections:   . Frequency of Communication with Friends and Family:   . Frequency of Social Gatherings with Friends and Family:   . Attends Religious Services:   . Active Member of Clubs or Organizations:   . Attends Archivist Meetings:   Marland Kitchen Marital Status:     Tobacco Counseling Counseling given: Not Answered   Clinical Intake:     Pain : No/denies pain    Activities of Daily Living In your present state of health, do you have any difficulty performing the following activities: 01/03/2020 04/24/2019  Hearing? N N  Vision? N N  Difficulty concentrating or making decisions? N N  Walking or climbing stairs? N N  Dressing or bathing? N N  Doing errands, shopping? N N  Preparing Food and eating ? N -  Using the Toilet? N -  In the past six months, have you accidently leaked urine? N -  Do you have problems with loss of bowel control? N -  Managing your Medications? N -  Managing your Finances? N -  Housekeeping or managing your Housekeeping? N -  Some recent data might be hidden    Patient Care Team: Colon Branch, MD as PCP - General Kathie Rhodes, MD as Consulting Physician (Urology) Josue Hector, MD as Consulting Physician (Cardiology) Sharmon Revere as Physician Assistant (Cardiology)  Indicate any recent Medical Services you may have received from other than [facility] providers in the past year (date may be approximate).       Assessment:   This is a routine wellness examination for Cory Zavala.   Dietary issues and exercise activities discussed: Current Exercise Habits: Home exercise routine, Type of exercise: stretching, Time (Minutes): 10, Frequency (Times/Week): 7, Weekly Exercise (Minutes/Week): 70, Intensity: Mild, Exercise limited by: None identified Diet (meal preparation, eat out, water intake, caffeinated beverages, dairy products, fruits and vegetables): in general, a "healthy" diet  , well balanced     Goals    .  Increase water intake    .  Maintain healthy lifestyle    .  Maintain healthy lifestyle (pt-stated)      Continue to eat heart healthy diet (full of fruits, vegetables, whole grains, lean protein, water--limit salt, fat, and sugar intake) and increase physical activity as tolerated. Continue doing brain stimulating activities (puzzles, reading, adult coloring books, staying active) to keep memory sharp.        Depression Screen PHQ 2/9 Scores 01/03/2020 04/24/2019 12/21/2018 06/25/2017 06/02/2016 09/30/2015 08/07/2014  PHQ - 2 Score 0 0 0 0 0 0 0    Fall Risk Fall Risk  01/03/2020 04/24/2019 06/25/2017 06/02/2016 09/30/2015  Falls in the past year? 0 0 No No No  Number falls in past yr: 0 - - - -  Injury with Fall? 0 - - - -  Follow up Education provided;Falls prevention discussed Falls evaluation completed - - -   Lives w/ wife in 2 story home.  Any stairs in or around the home? Yes  If so, are there any without handrails? No  Home free of loose throw rugs in walkways, pet beds, electrical cords, etc? Yes  Adequate lighting in your home to reduce risk of falls? Yes   ASSISTIVE DEVICES UTILIZED TO PREVENT FALLS: None    Gait steady and fast without use of assistive device  Cognitive Function:  MMSE - Mini Mental State Exam 06/25/2017 06/02/2016  Orientation to time 5 5  Orientation to Place 5 5  Registration 3 3  Attention/ Calculation 5 5  Recall 3 3  Language- name 2 objects 2 2   Language- repeat 1 1  Language- follow 3 step command 3 3  Language- read & follow direction 1 1  Write a sentence 1 1  Copy design 1 1  Total score 30 30     6CIT Screen 01/03/2020  What Year? 0 points  What month? 0 points  What time? 0 points  Count back from 20 0 points  Months in reverse 0 points  Repeat phrase 0 points  Total Score 0    Immunizations Immunization History  Administered Date(s) Administered  . Fluad Quad(high Dose 65+) 02/22/2019  . Influenza Split 03/18/2011, 03/17/2012  . Influenza Whole 05/24/2007, 05/02/2008, 03/15/2009, 03/14/2010  . Influenza, High Dose Seasonal PF 03/20/2013, 03/07/2018  . Influenza,inj,Quad PF,6+ Mos 03/09/2014  . Influenza-Unspecified 03/11/2015, 03/15/2017  . PFIZER SARS-COV-2 Vaccination 07/07/2019, 07/28/2019  . Pneumococcal Conjugate-13 08/07/2014  . Pneumococcal Polysaccharide-23 06/15/1996, 05/20/2009  . Td 09/18/1999  . Tdap 09/01/2011  . Zoster 05/24/2009    TDAP status: Up to date Flu Vaccine status: Up to date Pneumococcal vaccine status: Up to date Covid-19 vaccine status: Completed vaccines  Qualifies for Shingles Vaccine? Yes   Zostavax completed Yes     Screening Tests Health Maintenance  Topic Date Due  . INFLUENZA VACCINE  01/14/2020  . TETANUS/TDAP  08/31/2021  . COVID-19 Vaccine  Completed  . PNA vac Low Risk Adult  Completed    Health Maintenance  There are no preventive care reminders to display for this patient.  Colorectal cancer screening: No longer required.   Lung Cancer Screening: (Low Dose CT Chest recommended if Age 54-80 years, 30 pack-year currently smoking OR have quit w/in 15years.) does not qualify.     Additional Screening:  Vision Screening: Recommended annual ophthalmology exams for early detection of glaucoma and other disorders of the eye. Is the patient up to date with their annual eye exam?  Yes  Who is the provider or what is the name of the office in which the  patient  attends annual eye exams? Dr.Groat    Dental Screening: Recommended annual dental exams for proper oral hygiene  Community Resource Referral / Chronic Care Management: CRR required this visit?  No   CCM required this visit?  No      Plan:     Please schedule your next medicare wellness visit with me in 1 yr.  Continue to eat heart healthy diet (full of fruits, vegetables, whole grains, lean protein, water--limit salt, fat, and sugar intake) and increase physical activity as tolerated.  Continue doing brain stimulating activities (puzzles, reading, adult coloring books, staying active) to keep memory sharp.   Bring a copy of your living will and/or healthcare power of attorney to your next office visit.   I have personally reviewed and noted the following in the patient's chart:   . Medical and social history . Use of alcohol, tobacco or illicit drugs  . Current medications and supplements . Functional ability and status . Nutritional status . Physical activity . Advanced directives . List of other physicians . Hospitalizations, surgeries, and ER visits in previous 12 months . Vitals . Screenings to include cognitive, depression, and falls . Referrals and appointments  In addition, I have reviewed and discussed with patient certain preventive protocols, quality metrics, and best practice recommendations. A written personalized care plan for preventive services as well as general preventive health recommendations were provided to patient.     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

## 2020-01-03 ENCOUNTER — Other Ambulatory Visit: Payer: Self-pay

## 2020-01-03 ENCOUNTER — Ambulatory Visit (INDEPENDENT_AMBULATORY_CARE_PROVIDER_SITE_OTHER): Payer: Medicare HMO | Admitting: *Deleted

## 2020-01-03 ENCOUNTER — Encounter: Payer: Self-pay | Admitting: *Deleted

## 2020-01-03 VITALS — BP 140/84 | HR 63 | Temp 97.0°F | Ht 70.0 in | Wt 164.6 lb

## 2020-01-03 DIAGNOSIS — Z0000 Encounter for general adult medical examination without abnormal findings: Secondary | ICD-10-CM | POA: Diagnosis not present

## 2020-01-03 NOTE — Patient Instructions (Signed)
Please schedule your next medicare wellness visit with me in 1 yr.  Continue to eat heart healthy diet (full of fruits, vegetables, whole grains, lean protein, water--limit salt, fat, and sugar intake) and increase physical activity as tolerated.  Continue doing brain stimulating activities (puzzles, reading, adult coloring books, staying active) to keep memory sharp.    Cory Zavala , Thank you for taking time to come for your Medicare Wellness Visit. I appreciate your ongoing commitment to your health goals. Please review the following plan we discussed and let me know if I can assist you in the future.   These are the goals we discussed: Goals    .  Increase water intake    .  Maintain healthy lifestyle    .  Maintain healthy lifestyle (pt-stated)      Continue to eat heart healthy diet (full of fruits, vegetables, whole grains, lean protein, water--limit salt, fat, and sugar intake) and increase physical activity as tolerated. Continue doing brain stimulating activities (puzzles, reading, adult coloring books, staying active) to keep memory sharp.         This is a list of the screening recommended for you and due dates:  Health Maintenance  Topic Date Due  . Flu Shot  01/14/2020  . Tetanus Vaccine  08/31/2021  . COVID-19 Vaccine  Completed  . Pneumonia vaccines  Completed    Preventive Care 67 Years and Older, Male Preventive care refers to lifestyle choices and visits with your health care provider that can promote health and wellness. This includes:  A yearly physical exam. This is also called an annual well check.  Regular dental and eye exams.  Immunizations.  Screening for certain conditions.  Healthy lifestyle choices, such as diet and exercise. What can I expect for my preventive care visit? Physical exam Your health care provider will check:  Height and weight. These may be used to calculate body mass index (BMI), which is a measurement that tells if you are  at a healthy weight.  Heart rate and blood pressure.  Your skin for abnormal spots. Counseling Your health care provider may ask you questions about:  Alcohol, tobacco, and drug use.  Emotional well-being.  Home and relationship well-being.  Sexual activity.  Eating habits.  History of falls.  Memory and ability to understand (cognition).  Work and work Astronomer. What immunizations do I need?  Influenza (flu) vaccine  This is recommended every year. Tetanus, diphtheria, and pertussis (Tdap) vaccine  You may need a Td booster every 10 years. Varicella (chickenpox) vaccine  You may need this vaccine if you have not already been vaccinated. Zoster (shingles) vaccine  You may need this after age 80. Pneumococcal conjugate (PCV13) vaccine  One dose is recommended after age 48. Pneumococcal polysaccharide (PPSV23) vaccine  One dose is recommended after age 66. Measles, mumps, and rubella (MMR) vaccine  You may need at least one dose of MMR if you were born in 1957 or later. You may also need a second dose. Meningococcal conjugate (MenACWY) vaccine  You may need this if you have certain conditions. Hepatitis A vaccine  You may need this if you have certain conditions or if you travel or work in places where you may be exposed to hepatitis A. Hepatitis B vaccine  You may need this if you have certain conditions or if you travel or work in places where you may be exposed to hepatitis B. Haemophilus influenzae type b (Hib) vaccine  You may need this  if you have certain conditions. You may receive vaccines as individual doses or as more than one vaccine together in one shot (combination vaccines). Talk with your health care provider about the risks and benefits of combination vaccines. What tests do I need? Blood tests  Lipid and cholesterol levels. These may be checked every 5 years, or more frequently depending on your overall health.  Hepatitis C  test.  Hepatitis B test. Screening  Lung cancer screening. You may have this screening every year starting at age 55 if you have a 30-pack-year history of smoking and currently smoke or have quit within the past 15 years.  Colorectal cancer screening. All adults should have this screening starting at age 60 and continuing until age 47. Your health care provider may recommend screening at age 33 if you are at increased risk. You will have tests every 1-10 years, depending on your results and the type of screening test.  Prostate cancer screening. Recommendations will vary depending on your family history and other risks.  Diabetes screening. This is done by checking your blood sugar (glucose) after you have not eaten for a while (fasting). You may have this done every 1-3 years.  Abdominal aortic aneurysm (AAA) screening. You may need this if you are a current or former smoker.  Sexually transmitted disease (STD) testing. Follow these instructions at home: Eating and drinking  Eat a diet that includes fresh fruits and vegetables, whole grains, lean protein, and low-fat dairy products. Limit your intake of foods with high amounts of sugar, saturated fats, and salt.  Take vitamin and mineral supplements as recommended by your health care provider.  Do not drink alcohol if your health care provider tells you not to drink.  If you drink alcohol: ? Limit how much you have to 0-2 drinks a day. ? Be aware of how much alcohol is in your drink. In the [place] one drink equals one 12 oz bottle of beer (355 mL), one 5 oz glass of wine (148 mL), or one 1 oz glass of hard liquor (44 mL). Lifestyle  Take daily care of your teeth and gums.  Stay active. Exercise for at least 30 minutes on 5 or more days each week.  Do not use any products that contain nicotine or tobacco, such as cigarettes, e-cigarettes, and chewing tobacco. If you need help quitting, ask your health care provider.  If you are  sexually active, practice safe sex. Use a condom or other form of protection to prevent STIs (sexually transmitted infections).  Talk with your health care provider about taking a low-dose aspirin or statin. What's next?  Visit your health care provider once a year for a well check visit.  Ask your health care provider how often you should have your eyes and teeth checked.  Stay up to date on all vaccines. This information is not intended to replace advice given to you by your health care provider. Make sure you discuss any questions you have with your health care provider. Document Revised: 05/26/2018 Document Reviewed: 05/26/2018 Elsevier Patient Education  2020 [employer].

## 2020-01-23 DIAGNOSIS — R3915 Urgency of urination: Secondary | ICD-10-CM | POA: Diagnosis not present

## 2020-01-23 DIAGNOSIS — N401 Enlarged prostate with lower urinary tract symptoms: Secondary | ICD-10-CM | POA: Diagnosis not present

## 2020-01-23 DIAGNOSIS — R351 Nocturia: Secondary | ICD-10-CM | POA: Diagnosis not present

## 2020-03-12 DIAGNOSIS — R69 Illness, unspecified: Secondary | ICD-10-CM | POA: Diagnosis not present

## 2020-03-25 ENCOUNTER — Other Ambulatory Visit: Payer: Self-pay

## 2020-03-25 ENCOUNTER — Encounter: Payer: Self-pay | Admitting: Internal Medicine

## 2020-03-25 ENCOUNTER — Ambulatory Visit (INDEPENDENT_AMBULATORY_CARE_PROVIDER_SITE_OTHER): Payer: Medicare HMO | Admitting: Internal Medicine

## 2020-03-25 VITALS — BP 162/91 | HR 84 | Temp 98.0°F | Resp 18 | Ht 70.0 in | Wt 167.0 lb

## 2020-03-25 DIAGNOSIS — K648 Other hemorrhoids: Secondary | ICD-10-CM

## 2020-03-25 DIAGNOSIS — I10 Essential (primary) hypertension: Secondary | ICD-10-CM

## 2020-03-25 DIAGNOSIS — K219 Gastro-esophageal reflux disease without esophagitis: Secondary | ICD-10-CM

## 2020-03-25 MED ORDER — HYDROCORTISONE ACETATE 25 MG RE SUPP: 25.0000 mg | Freq: Two times a day (BID) | RECTAL | 1 refills | Status: DC | PRN

## 2020-03-25 NOTE — Progress Notes (Signed)
Pre visit review using our clinic review tool, if applicable. No additional management support is needed unless otherwise documented below in the visit note. 

## 2020-03-25 NOTE — Progress Notes (Signed)
Subjective:    Patient ID: Cory Zavala, male    DOB: 08-25-1930, 86 y.o.   MRN: [id number]  DOS:  03/25/2020 Type of visit - description: Routine visit Several issues discussed  HTN: BP elevates at home rarely, typically in the 140s/70.  GERD: Last week he experienced some heartburn, like a "sour" fluid coming up to his throat, also some dysphagia.  Symptoms are much improved this week. Denies fever, chills.  No weight loss No nausea or vomiting No change in the color of the stools No abdominal pain.  Also has well-known history of hemorrhoids, from time to time they swell up but denies pain or bleeding.  Shoulder pain: Evaluated by Ortho, still bothering him.  BP Readings from Last 3 Encounters:  03/25/20 (!) 162/91  01/03/20 140/84  11/14/19 118/74     Review of Systems See above   Past Medical History:  Diagnosis Date  . Back pain, chronic   . BPH (benign prostatic hypertrophy)   . Diverticulosis of colon    LEFT  . ED (erectile dysfunction)   . GERD (gastroesophageal reflux disease) 09/15/2012  . Gross hematuria    2015  . HTN (hypertension)   . Nocturia   . Normal cardiac stress test    PER CARDIOLOGIST NOTE, DR NISHAN, APPROX.  1994  . Osteoporosis   . RBBB     Past Surgical History:  Procedure Laterality Date  . CATARACT EXTRACTION W/ INTRAOCULAR LENS  IMPLANT, BILATERAL  2010  . CYSTOSCOPY WITH RETROGRADE PYELOGRAM, URETEROSCOPY AND STENT PLACEMENT Right 12/25/2013   Procedure: CYSTOSCOPY WITH RIGHT RETROGRADE PYELOGRAM, RIGHT URETEROSCOPY  BIOPSY AND STENT: BLADDER BIOPSY;  Surgeon: Claybon Jabs, MD;  Location: [place];  Service: Urology;  Laterality: Right;  Marland Kitchen VERTEBROPLASTY  2001    Allergies as of 03/25/2020      Reactions   Bee Venom Anaphylaxis      Medication List       Accurate as of March 25, 2020  9:05 PM. If you have any questions, ask your nurse or doctor.        D3 ADULT PO Take by mouth.   EPINEPHrine  0.3 mg/0.3 mL Soaj injection Commonly known as: EPI-PEN INJECT 0.3MLS(CONTENTS OF 1 SYRINGE) INTO THE MUSCLE AS DIRECTED   hydrocortisone 25 MG suppository Commonly known as: ANUSOL-HC Place 1 suppository (25 mg total) rectally 2 (two) times daily as needed for hemorrhoids. Started by: Kathlene November, MD   loratadine 10 MG tablet Commonly known as: Claritin Take 1-2 tablets 1-2 times per day   losartan 50 MG tablet Commonly known as: COZAAR Take 1 tablet (50 mg total) by mouth daily.   multivitamin tablet Take 1 tablet by mouth daily.   OMEGA-3 FATTY ACIDS PO Take 1 tablet by mouth daily.   OSTEO ADVANCE PO Take 4 tablets by mouth daily. OSTEO GOLD (BRAND)   pantoprazole 40 MG tablet Commonly known as: PROTONIX Take 1 tablet (40 mg total) by mouth daily.   PreserVision AREDS Caps Take 1 capsule by mouth daily.   vitamin B-12 500 MCG tablet Commonly known as: CYANOCOBALAMIN Take 500 mcg by mouth daily.          Objective:   Physical Exam BP (!) 162/91 (BP Location: Left Arm, Patient Position: Sitting, Cuff Size: Small)   Pulse 84   Temp 98 F (36.7 C) (Oral)   Resp 18   Ht 5\' 10"  (1.778 m)   Wt 167 lb (75.8 kg)  SpO2 95%   BMI 23.96 kg/m  General:   Well developed, NAD, BMI noted. HEENT:  Normocephalic . Face symmetric, atraumatic Lungs:  CTA B Normal respiratory effort, no intercostal retractions, no accessory muscle use. Heart: RRR,  no murmur.  Lower extremities: no pretibial edema bilaterally  DRE: External examination consistent with multiple hemorrhoids. Prostate size increase, not nodular or tender. No mass. Anoscopy: Multiple moderate size hemorrhoids, no active bleeding. Skin: Not pale. Not jaundice Neurologic:  alert & oriented X3.  Speech normal, gait appropriate for age and unassisted Psych--  Cognition and judgment appear intact.  Cooperative with normal attention span and concentration.  Behavior appropriate. No anxious or depressed  appearing.      Assessment    Assessment   HTN GERD RBBB MSK: --Chronic back pain -- Vertebroplasty 2001 -- Osteoporosis T score -3.3 (2014), declined all meds 2014 and  2019 GU: ---BPH ---ED ---Gross hematuria 2015  Initial CT show a question of urothelial cancer, subsequently a ureteroscopy was done and reportedly normal, subsequently a biopsy was negative. Follow-up MRI 01-2014 show improvement. Saw urology ~ 04-2015 Exercise stress test 2014 negative Bee sting allergies: has a Epi pen   PLAN: HTN: Ambulatory BPs in the 146/70, occasionally "spikes" to 170s.  That is not common. Continue losartan, monitoring BPs, watch salt intake, check BPs after 10 minutes of rest.  Check a BMP. GERD: Increased symptoms last week, he is essentially back to normal this week.  No red flag symptoms such as fever chills weight loss.  We agreed to continue PPIs but if symptoms return he will let me know for a GI referral, EGD?Marland Kitchen  Check a CBC. Hemorrhoids: Reports hemorrhoids swell up from time to time, no bleeding.  DRE and anoscopy with no red flag findings. Rec  Anusol HC as needed.  Call if symptoms increase Preventive Care: Had COVID vaccination including a booster.  Had a flu shot. RTC CPX 6 months   This visit occurred during the SARS-CoV-2 public health emergency.  Safety protocols were in place, including screening questions prior to the visit, additional usage of staff PPE, and extensive cleaning of exam room while observing appropriate contact time as indicated for disinfecting solutions.

## 2020-03-25 NOTE — Assessment & Plan Note (Signed)
HTN: Ambulatory BPs in the 146/70, occasionally "spikes" to 170s.  That is not common. Continue losartan, monitoring BPs, watch salt intake, check BPs after 10 minutes of rest.  Check a BMP. GERD: Increased symptoms last week, he is essentially back to normal this week.  No red flag symptoms such as fever chills weight loss.  We agreed to continue PPIs but if symptoms return he will let me know for a GI referral, EGD?Marland Kitchen  Check a CBC. Hemorrhoids: Reports hemorrhoids swell up from time to time, no bleeding.  DRE and anoscopy with no red flag findings. Rec  Anusol HC as needed.  Call if symptoms increase Preventive Care: Had COVID vaccination including a booster.  Had a flu shot. RTC CPX 6 months

## 2020-03-25 NOTE — Patient Instructions (Addendum)
Check the  blood pressure 2 or 3 times a week, after resting for 10 minutes BP GOAL is between 110/65 and  145/85. If it is consistently higher or lower, let me know  For hemorrhoids: Eat plenty of fiber Drink plenty of fluids You can use Anusol HC once or twice a day as needed for hemorrhoid irritation Use baby wipes   GO TO THE LAB : Get the blood work     [place], [place] back for a physical exam by April 2021

## 2020-03-29 ENCOUNTER — Other Ambulatory Visit: Payer: Self-pay

## 2020-03-29 ENCOUNTER — Other Ambulatory Visit (INDEPENDENT_AMBULATORY_CARE_PROVIDER_SITE_OTHER): Payer: Medicare HMO

## 2020-03-29 DIAGNOSIS — I10 Essential (primary) hypertension: Secondary | ICD-10-CM | POA: Diagnosis not present

## 2020-03-29 DIAGNOSIS — K219 Gastro-esophageal reflux disease without esophagitis: Secondary | ICD-10-CM

## 2020-03-29 LAB — CBC WITH DIFFERENTIAL/PLATELET
Basophils Relative: 0.6 %
Hemoglobin: 14.1 g/dL (ref 13.2–17.1)
MCH: 31.4 pg (ref 27.0–33.0)

## 2020-03-29 NOTE — Addendum Note (Signed)
Addended by: Kelle Darting A on: 03/29/2020 01:50 PM   Modules accepted: Orders

## 2020-03-30 LAB — CBC WITH DIFFERENTIAL/PLATELET
Absolute Monocytes: 589 cells/uL (ref 200–950)
Basophils Absolute: 43 cells/uL (ref 0–200)
Eosinophils Absolute: 107 cells/uL (ref 15–500)
Eosinophils Relative: 1.5 %
HCT: 42 % (ref 38.5–50.0)
Lymphs Abs: 2201 cells/uL (ref 850–3900)
MCHC: 33.6 g/dL (ref 32.0–36.0)
MCV: 93.5 fL (ref 80.0–100.0)
MPV: 10.7 fL (ref 7.5–12.5)
Monocytes Relative: 8.3 %
Neutro Abs: 4161 cells/uL (ref 1500–7800)
Neutrophils Relative %: 58.6 %
Platelets: 193 10*3/uL (ref 140–400)
RBC: 4.49 10*6/uL (ref 4.20–5.80)
RDW: 11.4 % (ref 11.0–15.0)
Total Lymphocyte: 31 %
WBC: 7.1 10*3/uL (ref 3.8–10.8)

## 2020-03-30 LAB — BASIC METABOLIC PANEL
BUN/Creatinine Ratio: 19 (calc) (ref 6–22)
BUN: 22 mg/dL (ref 7–25)
CO2: 27 mmol/L (ref 20–32)
Calcium: 9 mg/dL (ref 8.6–10.3)
Chloride: 109 mmol/L (ref 98–110)
Creat: 1.17 mg/dL — ABNORMAL HIGH (ref 0.70–1.11)
Glucose, Bld: 98 mg/dL (ref 65–99)
Potassium: 4.1 mmol/L (ref 3.5–5.3)
Sodium: 143 mmol/L (ref 135–146)

## 2020-07-04 ENCOUNTER — Other Ambulatory Visit: Payer: Self-pay | Admitting: Internal Medicine

## 2020-08-28 ENCOUNTER — Telehealth: Payer: Self-pay

## 2020-08-28 ENCOUNTER — Ambulatory Visit (HOSPITAL_COMMUNITY)
Admission: EM | Admit: 2020-08-28 | Discharge: 2020-08-28 | Disposition: A | Discharge status: Home / Self Care | Payer: Medicare HMO | Attending: Emergency Medicine | Admitting: Emergency Medicine

## 2020-08-28 ENCOUNTER — Encounter (HOSPITAL_COMMUNITY): Payer: Self-pay

## 2020-08-28 ENCOUNTER — Other Ambulatory Visit: Payer: Self-pay

## 2020-08-28 DIAGNOSIS — R9431 Abnormal electrocardiogram [ECG] [EKG]: Secondary | ICD-10-CM | POA: Insufficient documentation

## 2020-08-28 DIAGNOSIS — R42 Dizziness and giddiness: Secondary | ICD-10-CM | POA: Insufficient documentation

## 2020-08-28 DIAGNOSIS — I160 Hypertensive urgency: Secondary | ICD-10-CM

## 2020-08-28 LAB — COMPREHENSIVE METABOLIC PANEL
ALT: 15 U/L (ref 0–44)
AST: 21 U/L (ref 15–41)
Albumin: 3.6 g/dL (ref 3.5–5.0)
Alkaline Phosphatase: 73 U/L (ref 38–126)
Anion gap: 6 (ref 5–15)
BUN: 19 mg/dL (ref 8–23)
CO2: 29 mmol/L (ref 22–32)
Calcium: 9.1 mg/dL (ref 8.9–10.3)
Chloride: 107 mmol/L (ref 98–111)
Creatinine, Ser: 1.2 mg/dL (ref 0.61–1.24)
GFR, Estimated: 58 mL/min — ABNORMAL LOW (ref >60)
Glucose, Bld: 110 mg/dL — ABNORMAL HIGH (ref 70–99)
Potassium: 4.2 mmol/L (ref 3.5–5.1)
Sodium: 142 mmol/L (ref 135–145)
Total Bilirubin: 1 mg/dL (ref 0.3–1.2)
Total Protein: 6.4 g/dL — ABNORMAL LOW (ref 6.5–8.1)

## 2020-08-28 LAB — POCT URINALYSIS DIPSTICK, ED / UC
Bilirubin Urine: NEGATIVE
Glucose, UA: NEGATIVE mg/dL
Ketones, ur: NEGATIVE mg/dL
Leukocytes,Ua: NEGATIVE
Nitrite: NEGATIVE
Protein, ur: NEGATIVE mg/dL
Specific Gravity, Urine: 1.02 (ref 1.005–1.030)
Urobilinogen, UA: 0.2 mg/dL (ref 0.0–1.0)
pH: 7.5 (ref 5.0–8.0)

## 2020-08-28 LAB — CBC
HCT: 46.3 % (ref 39.0–52.0)
Hemoglobin: 15.2 g/dL (ref 13.0–17.0)
MCH: 31.3 pg (ref 26.0–34.0)
MCHC: 32.8 g/dL (ref 30.0–36.0)
MCV: 95.3 fL (ref 80.0–100.0)
Platelets: 217 10*3/uL (ref 150–400)
RBC: 4.86 MIL/uL (ref 4.22–5.81)
RDW: 12.4 % (ref 11.5–15.5)
WBC: 8 10*3/uL (ref 4.0–10.5)
nRBC: 0 % (ref 0.0–0.2)

## 2020-08-28 NOTE — Telephone Encounter (Signed)
Spoke w/ Pt- he has been having dizziness off and on for the last 24 hours, he has a hx of this several years ago and ended up having a UTI, he denies stroke like symptoms, and BP is normal. I offered him a visit here today at 2:20pm but he didn't want to drive all the way across town, he plans to be seen at [facility].

## 2020-08-28 NOTE — ED Triage Notes (Signed)
Pt in with c/o dizziness that started yesterday  States that he feels dizzy when he;s sitting down or up moving

## 2020-08-28 NOTE — Telephone Encounter (Signed)
Thank you, noted.

## 2020-08-28 NOTE — ED Provider Notes (Signed)
[facility]    CSN: [id number] Arrival date & time: 08/28/20  1143      History   Chief Complaint Chief Complaint  Patient presents with  . Dizziness    HPI Cory Zavala is a 87 y.o. male.   Patient presents with dizziness intermittently since yesterday.  He states the dizziness comes and goes without known alleviating or aggravating factors.  He describes it as the room spinning.  He denies chest pain, shortness of breath, numbness, weakness, headache, or other symptoms.  No treatments attempted at home.  Patient states he has been under a great deal of stress in the past 24 hours with family medical issues.  His medical history includes hypertension, right bundle branch block, chronic back pain, GERD, BPH.  The history is provided by the patient and medical records.    Past Medical History:  Diagnosis Date  . Back pain, chronic   . BPH (benign prostatic hypertrophy)   . Diverticulosis of colon    LEFT  . ED (erectile dysfunction)   . GERD (gastroesophageal reflux disease) 09/15/2012  . Gross hematuria    2015  . HTN (hypertension)   . Nocturia   . Normal cardiac stress test    PER CARDIOLOGIST NOTE, DR NISHAN, APPROX.  1994  . Osteoporosis   . RBBB     Patient Active Problem List   Diagnosis Date Noted  . Hemorrhoids 07/20/2017  . Seasonal allergies 09/30/2015  . PCP NOTES >>>> 05/15/2015  . Gross hematuria 08/09/2014  . Hyperglycemia 08/07/2014  . Osteoporosis 06/02/2013  . GERD (gastroesophageal reflux disease) 09/15/2012  . Chest pain 09/15/2012  . Back pain 02/12/2012  . Annual physical exam 09/01/2011  . Erectile dysfunction 09/01/2011  . DEGENERATIVE JOINT DISEASE 05/20/2009  . PSA, INCREASED 05/20/2009  . Essential hypertension 07/25/2007  . BENIGN PROSTATIC HYPERTROPHY 07/25/2007    Past Surgical History:  Procedure Laterality Date  . CATARACT EXTRACTION W/ INTRAOCULAR LENS  IMPLANT, BILATERAL  2010  . CYSTOSCOPY WITH RETROGRADE  PYELOGRAM, URETEROSCOPY AND STENT PLACEMENT Right 12/25/2013   Procedure: CYSTOSCOPY WITH RIGHT RETROGRADE PYELOGRAM, RIGHT URETEROSCOPY  BIOPSY AND STENT: BLADDER BIOPSY;  Surgeon: Claybon Jabs, MD;  Location: [facility];  Service: Urology;  Laterality: Right;  Marland Kitchen VERTEBROPLASTY  2001       Home Medications    Prior to Admission medications   Medication Sig Start Date End Date Taking? Authorizing Provider  Cholecalciferol (D3 ADULT PO) Take by mouth.    [provider]  EPINEPHrine 0.3 mg/0.3 mL IJ SOAJ injection INJECT 0.3MLS(CONTENTS OF 1 SYRINGE) INTO THE MUSCLE AS DIRECTED Patient not taking: Reported on 01/03/2020 07/20/17   Colon Branch, MD  hydrocortisone (ANUSOL-HC) 25 MG suppository Place 1 suppository (25 mg total) rectally 2 (two) times daily as needed for hemorrhoids. 03/25/20   Colon Branch, MD  loratadine (CLARITIN) 10 MG tablet Take 1-2 tablets 1-2 times per day 10/17/19   Jiles Prows, MD  losartan (COZAAR) 50 MG tablet Take 1 tablet (50 mg total) by mouth daily. 07/04/20   Colon Branch, MD  Multiple Vitamin (MULTIVITAMIN) tablet Take 1 tablet by mouth daily.    [provider]  Multiple Vitamins-Minerals (PRESERVISION AREDS) CAPS Take 1 capsule by mouth daily.    [provider]  Nutritional Supplements (OSTEO ADVANCE PO) Take 4 tablets by mouth daily. OSTEO GOLD (BRAND)    [provider]  OMEGA-3 FATTY ACIDS PO Take 1 tablet by  mouth daily.    [provider]  pantoprazole (PROTONIX) 40 MG tablet Take 1 tablet (40 mg total) by mouth daily. 10/12/19   Colon Branch, MD  vitamin B-12 (CYANOCOBALAMIN) 500 MCG tablet Take 500 mcg by mouth daily.    [provider]    Family History Family History  Problem Relation Age of Onset  . CAD Neg Hx   . Diabetes Neg Hx   . Colon cancer Neg Hx   . Prostate cancer Neg Hx   . Allergic rhinitis Neg Hx   . Angioedema Neg Hx   . Asthma Neg Hx   . Atopy Neg Hx   . Eczema  Neg Hx   . Immunodeficiency Neg Hx   . Urticaria Neg Hx     Social History Social History   Tobacco Use  . Smoking status: Never Smoker  . Smokeless tobacco: Never Used  Substance Use Topics  . Alcohol use: No  . Drug use: No     Allergies   Bee venom   Review of Systems Review of Systems  Constitutional: Negative for chills, diaphoresis and fever.  HENT: Negative for ear pain and sore throat.   Eyes: Negative for pain and visual disturbance.  Respiratory: Negative for cough and shortness of breath.   Cardiovascular: Negative for chest pain and palpitations.  Gastrointestinal: Negative for abdominal pain, nausea and vomiting.  Genitourinary: Negative for dysuria and hematuria.  Musculoskeletal: Negative for arthralgias and back pain.  Skin: Negative for color change and rash.  Neurological: Positive for dizziness. Negative for seizures, syncope, facial asymmetry, speech difficulty, weakness, numbness and headaches.  All other systems reviewed and are negative.    Physical Exam Triage Vital Signs ED Triage Vitals  Enc Vitals Group     BP 08/28/20 1256 (!) 192/96     Pulse Rate 08/28/20 1256 69     Resp 08/28/20 1256 18     Temp 08/28/20 1256 97.6 F (36.4 C)     Temp src --      SpO2 08/28/20 1256 99 %     Weight --      Height --      Head Circumference --      Peak Flow --      Pain Score 08/28/20 1255 0     Pain Loc --      Pain Edu? --      Excl. in [place]? --    No data found.  Updated Vital Signs BP (!) 234/108 (BP Location: Left Arm)   Pulse 71   Temp 97.6 F (36.4 C)   Resp 18   SpO2 100%   Visual Acuity Right Eye Distance:   Left Eye Distance:   Bilateral Distance:    Right Eye Near:   Left Eye Near:    Bilateral Near:     Physical Exam Vitals and nursing note reviewed.  Constitutional:      General: He is not in acute distress.    Appearance: He is well-developed.  HENT:     Head: Normocephalic and atraumatic.     Right Ear:  Tympanic membrane normal.     Left Ear: Tympanic membrane normal.     Mouth/Throat:     Mouth: Mucous membranes are moist.  Eyes:     Conjunctiva/sclera: Conjunctivae normal.  Cardiovascular:     Rate and Rhythm: Normal rate and regular rhythm.     Heart sounds: Normal heart sounds.  Pulmonary:  Effort: Pulmonary effort is normal. No respiratory distress.     Breath sounds: Normal breath sounds.  Abdominal:     Palpations: Abdomen is soft.     Tenderness: There is no abdominal tenderness.  Musculoskeletal:     Cervical back: Neck supple.     Right lower leg: No edema.     Left lower leg: No edema.  Skin:    General: Skin is warm and dry.  Neurological:     General: No focal deficit present.     Mental Status: He is alert and oriented to person, place, and time.     Sensory: No sensory deficit.     Motor: No weakness.     Gait: Gait normal.  Psychiatric:        Mood and Affect: Mood normal.        Behavior: Behavior normal.      UC Treatments / Results  Labs (all labs ordered are listed, but only abnormal results are displayed) Labs Reviewed  COMPREHENSIVE METABOLIC PANEL - Abnormal; Notable for the following components:      Result Value   Glucose, Bld 110 (*)    Total Protein 6.4 (*)    GFR, Estimated 58 (*)    All other components within normal limits  POCT URINALYSIS DIPSTICK, ED / UC - Abnormal; Notable for the following components:   Hgb urine dipstick TRACE (*)    All other components within normal limits  CBC    EKG   Radiology No results found.  Procedures Procedures (including critical care time)  Medications Ordered in UC Medications - No data to display  Initial Impression / Assessment and Plan / UC Course  I have reviewed the triage vital signs and the nursing notes.  Pertinent labs & imaging results that were available during my care of the patient were reviewed by me and considered in my medical decision making (see chart for  details).   Abnormal EKG, dizziness, hypertensive urgency.  Patient refuses transfer to the ED.  EKG shows sinus rhythm, rate 61, right bundle branch block, no ST elevation, inverted T wave in V1 changed from previous EKG in 2017.  Urine does not show signs of infection.  CBC, CMP similar to previous.  Discussed with patient possible outcomes of not going to the ED, including cardiovascular events and even death; Patient still declines transfer to the ED.  Instructed patient to follow-up with his PCP this afternoon or tomorrow.  He agrees to plan of care.   Final Clinical Impressions(s) / UC Diagnoses   Final diagnoses:  Abnormal EKG  Dizziness  Hypertensive urgency     Discharge Instructions     It is my recommendation that you go to the emergency department today.    Your blood pressure is very elevated: 192/96.    Your EKG is abnormal and changed from the previous one in 2017.    Your urine does not show signs of infection.  Your blood work is pending.    Schedule an appointment with your primary care provider for tomorrow.         ED Prescriptions    None     PDMP not reviewed this encounter.   Sharion Balloon, NP 08/28/20 1505

## 2020-08-28 NOTE — Discharge Instructions (Addendum)
It is my recommendation that you go to the emergency department today.    Your blood pressure is very elevated: 192/96.    Your EKG is abnormal and changed from the previous one in 2017.    Your urine does not show signs of infection.  Your blood work is pending.    Schedule an appointment with your primary care provider for tomorrow.

## 2020-08-28 NOTE — Telephone Encounter (Signed)
Caller states that he is dizzy. Please call in a medication for him. Declined triage. Please call the patient when the medicine has been sent to the pharmacy.

## 2020-08-28 NOTE — ED Notes (Signed)
Pt was told to go to the ED, pt refused to the ED, states he has so many things to take care of. Pt was told about the risk of high blood pressure, pt verbalized understanding and said he ca not go to the ED, he will go to his PCP tomorrow.

## 2020-08-30 ENCOUNTER — Ambulatory Visit: Payer: Medicare HMO | Admitting: Internal Medicine

## 2020-09-02 ENCOUNTER — Ambulatory Visit (INDEPENDENT_AMBULATORY_CARE_PROVIDER_SITE_OTHER): Payer: Medicare HMO | Admitting: Internal Medicine

## 2020-09-02 ENCOUNTER — Ambulatory Visit (HOSPITAL_BASED_OUTPATIENT_CLINIC_OR_DEPARTMENT_OTHER)
Admission: RE | Admit: 2020-09-02 | Discharge: 2020-09-02 | Disposition: A | Discharge status: Home / Self Care | Payer: Medicare HMO | Source: Ambulatory Visit | Attending: Internal Medicine | Admitting: Internal Medicine

## 2020-09-02 ENCOUNTER — Other Ambulatory Visit: Payer: Self-pay | Admitting: Internal Medicine

## 2020-09-02 ENCOUNTER — Other Ambulatory Visit: Payer: Self-pay

## 2020-09-02 VITALS — BP 198/95 | HR 67 | Temp 98.1°F | Ht 70.0 in | Wt 167.6 lb

## 2020-09-02 DIAGNOSIS — R079 Chest pain, unspecified: Secondary | ICD-10-CM

## 2020-09-02 DIAGNOSIS — I10 Essential (primary) hypertension: Secondary | ICD-10-CM | POA: Diagnosis not present

## 2020-09-02 DIAGNOSIS — I700 Atherosclerosis of aorta: Secondary | ICD-10-CM | POA: Diagnosis not present

## 2020-09-02 DIAGNOSIS — R739 Hyperglycemia, unspecified: Secondary | ICD-10-CM

## 2020-09-02 DIAGNOSIS — R42 Dizziness and giddiness: Secondary | ICD-10-CM

## 2020-09-02 LAB — LIPID PANEL
Cholesterol: 146 mg/dL (ref 0–200)
HDL: 56.8 mg/dL (ref >39.00)
LDL Cholesterol: 80 mg/dL (ref 0–99)
NonHDL: 89
Total CHOL/HDL Ratio: 3
Triglycerides: 45 mg/dL (ref 0.0–149.0)
VLDL: 9 mg/dL (ref 0.0–40.0)

## 2020-09-02 LAB — HEMOGLOBIN A1C: Hgb A1c MFr Bld: 6.1 % (ref 4.6–6.5)

## 2020-09-02 MED ORDER — AMLODIPINE BESYLATE 5 MG PO TABS: 5.0000 mg | ORAL_TABLET | Freq: Every day | ORAL | 6 refills | Status: DC

## 2020-09-02 NOTE — Assessment & Plan Note (Signed)
Dizziness, elevated BP, chest wall pain:  sxs resolved however patient is quite concerned and request a cardiology referral.  He also have chest soreness, likely MSK issue, see description above. We talk about MRI of the brain to evaluate dizziness, he is quite hesitant.  We agreed that if symptoms return he will either call or call 911. Plan: Cardiology referral, left rib series  HTN: Not well controlled, was very elevated at the urgent care few days ago and is elevated today. At home BP was 135/84 yesterday but last night was 171/98. Plan:Continue losartan, add amlodipine, monitor BPs, see AVS. RTC 3 months CPX

## 2020-09-02 NOTE — Patient Instructions (Addendum)
Start taking a new blood pressure medication called amlodipine 5 mg.  Continue all the other  medications as before  Check the  blood pressure 4 times a week BP GOAL is between 110/65 and  135/85. If it is consistently higher or lower, let me know    GO TO THE LAB : Get the blood work     [place], [place] back for a physical exam in 3 months   STOP BY THE FIRST FLOOR:  get the XR

## 2020-09-02 NOTE — Progress Notes (Signed)
Subjective:    Patient ID: Cory Zavala, male    DOB: April 11, 1931, 87 y.o.   MRN: [id number]  DOS:  09/02/2020 Type of visit - description: Follow-up  Went to urgent care 08/28/2020: Chief complaint was dizziness, in the context of severe stress, at the time BP was 192/96. CMP, CBC essentially normal, EKG RBBB, not new.  No acute changes Declined  ED transfer.  The dizziness is described as a spinning, steady, worsening by trying to walk. No associated nausea or chest pain.  No headache, motor deficit, or diplopia.  No palpitations. Sxs lasted  48 hours and self resolved.  He did admit to a stress related to his a stepdaughter health.  He has checked his BP since the urgent care visits and readings were reviewed  Review of Systems Denies any fever chills. No cough No edema He is able to take his regular walks without problems. He did report sore spot on the left chest wall anteriorly.  It is somewhat TTP.  Past Medical History:  Diagnosis Date  . Back pain, chronic   . BPH (benign prostatic hypertrophy)   . Diverticulosis of colon    LEFT  . ED (erectile dysfunction)   . GERD (gastroesophageal reflux disease) 09/15/2012  . Gross hematuria    2015  . HTN (hypertension)   . Nocturia   . Normal cardiac stress test    PER CARDIOLOGIST NOTE, DR NISHAN, APPROX.  1994  . Osteoporosis   . RBBB     Past Surgical History:  Procedure Laterality Date  . CATARACT EXTRACTION W/ INTRAOCULAR LENS  IMPLANT, BILATERAL  2010  . CYSTOSCOPY WITH RETROGRADE PYELOGRAM, URETEROSCOPY AND STENT PLACEMENT Right 12/25/2013   Procedure: CYSTOSCOPY WITH RIGHT RETROGRADE PYELOGRAM, RIGHT URETEROSCOPY  BIOPSY AND STENT: BLADDER BIOPSY;  Surgeon: Claybon Jabs, MD;  Location: [facility];  Service: Urology;  Laterality: Right;  Marland Kitchen VERTEBROPLASTY  2001    Allergies as of 09/02/2020      Reactions   Bee Venom Anaphylaxis      Medication List       Accurate as of September 02, 2020   9:25 PM. If you have any questions, ask your nurse or doctor.        amLODipine 5 MG tablet Commonly known as: NORVASC Take 1 tablet (5 mg total) by mouth daily. Started by: Kathlene November, MD   D3 ADULT PO Take by mouth.   EPINEPHrine 0.3 mg/0.3 mL Soaj injection Commonly known as: EPI-PEN INJECT 0.3MLS(CONTENTS OF 1 SYRINGE) INTO THE MUSCLE AS DIRECTED   hydrocortisone 25 MG suppository Commonly known as: ANUSOL-HC Place 1 suppository (25 mg total) rectally 2 (two) times daily as needed for hemorrhoids.   loratadine 10 MG tablet Commonly known as: Claritin Take 1-2 tablets 1-2 times per day   losartan 50 MG tablet Commonly known as: COZAAR Take 1 tablet (50 mg total) by mouth daily.   multivitamin tablet Take 1 tablet by mouth daily.   OMEGA-3 FATTY ACIDS PO Take 1 tablet by mouth daily.   OSTEO ADVANCE PO Take 4 tablets by mouth daily. OSTEO GOLD (BRAND)   pantoprazole 40 MG tablet Commonly known as: PROTONIX Take 1 tablet (40 mg total) by mouth daily.   PreserVision AREDS Caps Take 1 capsule by mouth daily.   vitamin B-12 500 MCG tablet Commonly known as: CYANOCOBALAMIN Take 500 mcg by mouth daily.          Objective:   Physical Exam BP Marland Kitchen)  198/95 (BP Location: Left Arm, Patient Position: Sitting)   Pulse 67   Temp 98.1 F (36.7 C) (Oral)   Ht 5\' 10"  (1.778 m)   Wt 167 lb 9.6 oz (76 kg)   SpO2 98%   BMI 24.05 kg/m  General:   Well developed, NAD, BMI noted. HEENT:  Normocephalic . Face symmetric, atraumatic Neck: Normal carotid arteries Lungs:  CTA B Normal respiratory effort, no intercostal retractions, no accessory muscle use. Chest wall: Slightly TTP at the left anterior chest wall Heart: RRR,  no murmur.  Lower extremities: no pretibial edema bilaterally  Skin: Not pale. Not jaundice Neurologic:  alert & oriented X3.  Speech normal, gait appropriate for age and unassisted Psych--  Cognition and judgment appear intact.  Cooperative with  normal attention span and concentration.  Behavior appropriate. No anxious or depressed appearing.      Assessment     Assessment   HTN GERD RBBB MSK: --Chronic back pain -- Vertebroplasty 2001 -- Osteoporosis T score -3.3 (2014), declined all meds 2014 and  2019 GU: ---BPH ---ED ---Gross hematuria 2015  Initial CT show a question of urothelial cancer, subsequently a ureteroscopy was done and reportedly normal, subsequently a biopsy was negative. Follow-up MRI 01-2014 show improvement. Saw urology ~ 04-2015 Exercise stress test 2014 negative Bee sting allergies: has a Epi pen   PLAN: Dizziness, elevated BP, chest wall pain:  sxs resolved however patient is quite concerned and request a cardiology referral.  He also have chest soreness, likely MSK issue, see description above. We talk about MRI of the brain to evaluate dizziness, he is quite hesitant.  We agreed that if symptoms return he will either call or call 911. Plan: Cardiology referral, left rib series  HTN: Not well controlled, was very elevated at the urgent care few days ago and is elevated today. At home BP was 135/84 yesterday but last night was 171/98. Plan:Continue losartan, add amlodipine, monitor BPs, see AVS. RTC 3 months CPX    This visit occurred during the SARS-CoV-2 public health emergency.  Safety protocols were in place, including screening questions prior to the visit, additional usage of staff PPE, and extensive cleaning of exam room while observing appropriate contact time as indicated for disinfecting solutions.

## 2020-09-24 ENCOUNTER — Encounter: Payer: Medicare HMO | Admitting: Internal Medicine

## 2020-10-02 ENCOUNTER — Other Ambulatory Visit: Payer: Self-pay | Admitting: Internal Medicine

## 2020-10-22 ENCOUNTER — Encounter: Payer: Self-pay | Admitting: Cardiology

## 2020-10-22 ENCOUNTER — Ambulatory Visit: Payer: Medicare HMO | Admitting: Cardiology

## 2020-10-22 ENCOUNTER — Other Ambulatory Visit: Payer: Self-pay

## 2020-10-22 VITALS — BP 120/80 | HR 71 | Ht 70.0 in | Wt 163.0 lb

## 2020-10-22 DIAGNOSIS — I10 Essential (primary) hypertension: Secondary | ICD-10-CM | POA: Diagnosis not present

## 2020-10-22 DIAGNOSIS — R079 Chest pain, unspecified: Secondary | ICD-10-CM | POA: Diagnosis not present

## 2020-10-22 DIAGNOSIS — Z7189 Other specified counseling: Secondary | ICD-10-CM

## 2020-10-22 NOTE — Patient Instructions (Signed)

## 2020-10-22 NOTE — Progress Notes (Signed)
Cardiology Office Note:    Date:  10/22/2020   ID:  Cory Zavala, DOB [birth year], MRN [id number]  PCP:  Colon Branch, MD  Cardiologist:  Buford Dresser, MD  Referring MD: Dr. Kathlene November  Chief Complaint  Patient presents with   New Patient (Initial Visit)   Chest Pain   Hypertension    History of Present Illness:    Cory Zavala is a 87 y.o. male with a hx of hypertension, GERD, arthritis who is seen as a new consult at the request of Dr. Kathlene November for the evaluation and management of chest pain. He was previously seen by Dr. Johnsie Cancel, last visit 2017.  Note from 09/02/20 with Dr. Larose Kells reviewed. Noted chest wall pain thought to be MSK, patient requested cardiology referral for further evaluation.  Went to urgent care with vertigo issues, waited about 90 min, when taken back his BP was elevated and ECG abnormal. Vertigo symptoms have resolved.   Urgent note from 3/16, BP 192/96.   Walks every day, keeps up two houses. No limitations. Climbs stairs multiple times/day, walks daily, stays very active.  Has been checking BP at home 2-3 times/week. Was 133/81, HR 80 today. Typically has been around this. Has seen as low at 916 systolic.   Had remote issue with chest wall tenderness, has not had in a long time, can feel mild tenderness in his left upper ribcage if he presses hard.  Denies chest pain, shortness of breath at rest or with normal exertion. No PND, orthopnea, LE edema or unexpected weight gain. No syncope or palpitations.  Past Medical History:  Diagnosis Date   Back pain, chronic    BPH (benign prostatic hypertrophy)    Diverticulosis of colon    LEFT   ED (erectile dysfunction)    GERD (gastroesophageal reflux disease) 09/15/2012   Gross hematuria    2015   HTN (hypertension)    Nocturia    Normal cardiac stress test    PER CARDIOLOGIST NOTE, DR NISHAN, APPROX.  1994   Osteoporosis    RBBB     Past Surgical History:  Procedure Laterality Date   CATARACT  EXTRACTION W/ INTRAOCULAR LENS  IMPLANT, BILATERAL  2010   CYSTOSCOPY WITH RETROGRADE PYELOGRAM, URETEROSCOPY AND STENT PLACEMENT Right 12/25/2013   Procedure: CYSTOSCOPY WITH RIGHT RETROGRADE PYELOGRAM, RIGHT URETEROSCOPY  BIOPSY AND STENT: BLADDER BIOPSY;  Surgeon: Claybon Jabs, MD;  Location: [facility];  Service: Urology;  Laterality: Right;   VERTEBROPLASTY  2001    Current Medications: Current Outpatient Medications on File Prior to Visit  Medication Sig   amLODipine (NORVASC) 5 MG tablet Take 1 tablet (5 mg total) by mouth daily.   Cholecalciferol (D3 ADULT PO) Take by mouth.   EPINEPHrine 0.3 mg/0.3 mL IJ SOAJ injection INJECT 0.3MLS(CONTENTS OF 1 SYRINGE) INTO THE MUSCLE AS DIRECTED   hydrocortisone (ANUSOL-HC) 25 MG suppository Place 1 suppository (25 mg total) rectally 2 (two) times daily as needed for hemorrhoids.   loratadine (CLARITIN) 10 MG tablet Take 1-2 tablets 1-2 times per day   losartan (COZAAR) 50 MG tablet Take 1 tablet (50 mg total) by mouth daily.   Multiple Vitamin (MULTIVITAMIN) tablet Take 1 tablet by mouth daily.   Multiple Vitamins-Minerals (PRESERVISION AREDS) CAPS Take 1 capsule by mouth daily.   Nutritional Supplements (OSTEO ADVANCE PO) Take 4 tablets by mouth daily. OSTEO GOLD (BRAND)   OMEGA-3 FATTY ACIDS PO Take 1 tablet by mouth daily.   pantoprazole (  PROTONIX) 40 MG tablet TAKE 1 TABLET BY MOUTH EVERY DAY   vitamin B-12 (CYANOCOBALAMIN) 500 MCG tablet Take 500 mcg by mouth daily.   No current facility-administered medications on file prior to visit.     Allergies:   Bee venom   Social History   Tobacco Use   Smoking status: Never Smoker   Smokeless tobacco: Never Used  Substance Use Topics   Alcohol use: No   Drug use: No    Family History: family history is negative for CAD, Diabetes, Colon cancer, Prostate cancer, Allergic rhinitis, Angioedema, Asthma, Atopy, Eczema, Immunodeficiency, and Urticaria.  ROS:   Please see  the history of present illness.  Additional pertinent ROS: Constitutional: Negative for chills, fever, night sweats, unintentional weight loss  HENT: Negative for ear pain and hearing loss.   Eyes: Negative for loss of vision and eye pain.  Respiratory: Negative for cough, sputum, wheezing.   Cardiovascular: See HPI. Gastrointestinal: Negative for abdominal pain, melena, and hematochezia.  Genitourinary: Negative for dysuria and hematuria.  Musculoskeletal: Negative for falls and myalgias.  Skin: Negative for itching and rash.  Neurological: Negative for focal weakness, focal sensory changes and loss of consciousness.  Endo/Heme/Allergies: Does not bruise/bleed easily.     EKGs/Labs/Other Studies Reviewed:    The following studies were reviewed today: No prior cardiac studies  EKG:  EKG is personally reviewed.  The ekg ordered today demonstrates SR with PAC, RBBB at 71 bpm  Recent Labs: 08/28/2020: ALT 15; BUN 19; Creatinine, Ser 1.20; Hemoglobin 15.2; Platelets 217; Potassium 4.2; Sodium 142  Recent Lipid Panel    Component Value Date/Time   CHOL 146 09/02/2020 0945   TRIG 45.0 09/02/2020 0945   TRIG 89 05/24/2006 1038   HDL 56.80 09/02/2020 0945   CHOLHDL 3 09/02/2020 0945   VLDL 9.0 09/02/2020 0945   LDLCALC 80 09/02/2020 0945    Physical Exam:    VS:  BP 120/80 (BP Location: Left Arm, Patient Position: Sitting, Cuff Size: Normal)   Pulse 71   Ht 5\' 10"  (1.778 m)   Wt 163 lb (73.9 kg)   BMI 23.39 kg/m     Wt Readings from Last 3 Encounters:  10/22/20 163 lb (73.9 kg)  09/02/20 167 lb 9.6 oz (76 kg)  03/25/20 167 lb (75.8 kg)    GEN: Well nourished, well developed in no acute distress HEENT: Normal, moist mucous membranes NECK: No JVD CARDIAC: regular rhythm, normal S1 and S2, no rubs or gallops. No murmur. VASCULAR: Radial and DP pulses 2+ bilaterally. No carotid bruits RESPIRATORY:  Clear to auscultation without rales, wheezing or rhonchi  ABDOMEN: Soft,  non-tender, non-distended MUSCULOSKELETAL:  Ambulates independently SKIN: Warm and dry, no edema NEUROLOGIC:  Alert and oriented x 3. No focal neuro deficits noted. PSYCHIATRIC:  Normal affect    ASSESSMENT:    1. Chest pain, unspecified type   2. Essential hypertension   3. Cardiac risk counseling   4. Counseling on health promotion and disease prevention    PLAN:    Hypertension: -at goal today -continue amlodipine, losartan  Chest pain: -atypical, intermittent, tender to palpation -no further evaluation at this time -counseled on red flag warning signs that need immediate medical attention  Cardiac risk counseling and prevention recommendations: -recommend heart healthy/Mediterranean diet, with whole grains, fruits, vegetable, fish, lean meats, nuts, and olive oil. Limit salt. -recommend moderate walking, 3-5 times/week for 30-50 minutes each session. Aim for at least 150 minutes.week. Goal should be pace of 3  miles/hours, or walking 1.5 miles in 30 minutes -recommend avoidance of tobacco products. Avoid excess alcohol. -ASCVD risk score: The ASCVD Risk score Mikey Bussing DC Jr., et al., 2013) failed to calculate for the following reasons:   The 2013 ASCVD risk score is only valid for ages 47 to 63    Plan for follow up: I would be happy to see him back as needed  Buford Dresser, MD, PhD, [facility]    Medication Adjustments/Labs and Tests Ordered: Current medicines are reviewed at length with the patient today.  Concerns regarding medicines are outlined above.  Orders Placed This Encounter  Procedures   EKG 12-Lead   No orders of the defined types were placed in this encounter.   Patient Instructions  Medication Instructions:  Your Physician recommend you continue on your current medication as directed.    *If you need a refill on your cardiac medications before your next appointment, please call your pharmacy*   Lab  Work: None   Testing/Procedures: None   Follow-Up: At [facility], you and your health needs are our priority.  As part of our continuing mission to provide you with exceptional heart care, we have created designated Provider Care Teams.  These Care Teams include your primary Cardiologist (physician) and Advanced Practice Providers (APPs -  Physician Assistants and Nurse Practitioners) who all work together to provide you with the care you need, when you need it.  We recommend signing up for the patient portal called "MyChart".  Sign up information is provided on this After Visit Summary.  MyChart is used to connect with patients for Virtual Visits (Telemedicine).  Patients are able to view lab/test results, encounter notes, upcoming appointments, etc.  Non-urgent messages can be sent to your provider as well.   To learn more about what you can do with MyChart, go to [email or web address].    Your next appointment:   As needed   The format for your next appointment:   In Person  Provider:   Buford Dresser, MD   Signed, Buford Dresser, MD PhD 10/22/2020  [place]

## 2020-10-31 ENCOUNTER — Telehealth: Payer: Self-pay | Admitting: Internal Medicine

## 2020-10-31 NOTE — Telephone Encounter (Signed)
He can get flonase otc and or coricidin hpb

## 2020-10-31 NOTE — Telephone Encounter (Signed)
Please advise in PCP absence. No appts available

## 2020-10-31 NOTE — Telephone Encounter (Signed)
Patient is tested Pos for Covid states he only has nasal congestion and wants to know that's the over the counter recommendation. Or if something can be called in for him.

## 2020-10-31 NOTE — Telephone Encounter (Signed)
Spoke w/ Pt- informed of recommendations. Pt verbalized understanding.  

## 2020-11-28 DIAGNOSIS — K08409 Partial loss of teeth, unspecified cause, unspecified class: Secondary | ICD-10-CM | POA: Diagnosis not present

## 2020-11-28 DIAGNOSIS — M1990 Unspecified osteoarthritis, unspecified site: Secondary | ICD-10-CM | POA: Diagnosis not present

## 2020-11-28 DIAGNOSIS — I10 Essential (primary) hypertension: Secondary | ICD-10-CM | POA: Diagnosis not present

## 2020-11-28 DIAGNOSIS — G8929 Other chronic pain: Secondary | ICD-10-CM | POA: Diagnosis not present

## 2020-11-28 DIAGNOSIS — N529 Male erectile dysfunction, unspecified: Secondary | ICD-10-CM | POA: Diagnosis not present

## 2020-11-28 DIAGNOSIS — R32 Unspecified urinary incontinence: Secondary | ICD-10-CM | POA: Diagnosis not present

## 2020-11-28 DIAGNOSIS — K219 Gastro-esophageal reflux disease without esophagitis: Secondary | ICD-10-CM | POA: Diagnosis not present

## 2020-11-28 DIAGNOSIS — I700 Atherosclerosis of aorta: Secondary | ICD-10-CM | POA: Diagnosis not present

## 2020-12-11 DIAGNOSIS — H52203 Unspecified astigmatism, bilateral: Secondary | ICD-10-CM | POA: Diagnosis not present

## 2020-12-11 DIAGNOSIS — H5213 Myopia, bilateral: Secondary | ICD-10-CM | POA: Diagnosis not present

## 2020-12-11 DIAGNOSIS — H43812 Vitreous degeneration, left eye: Secondary | ICD-10-CM | POA: Diagnosis not present

## 2020-12-11 DIAGNOSIS — H524 Presbyopia: Secondary | ICD-10-CM | POA: Diagnosis not present

## 2020-12-11 DIAGNOSIS — Z961 Presence of intraocular lens: Secondary | ICD-10-CM | POA: Diagnosis not present

## 2020-12-24 DIAGNOSIS — Z0100 Encounter for examination of eyes and vision without abnormal findings: Secondary | ICD-10-CM | POA: Diagnosis not present

## 2020-12-27 ENCOUNTER — Other Ambulatory Visit: Payer: Self-pay

## 2020-12-27 MED ORDER — EPINEPHRINE 0.3 MG/0.3ML IJ SOAJ: 0.3000 mg | INTRAMUSCULAR | 2 refills | Status: DC | PRN

## 2021-01-01 ENCOUNTER — Other Ambulatory Visit: Payer: Self-pay | Admitting: Internal Medicine

## 2021-01-11 ENCOUNTER — Other Ambulatory Visit: Payer: Self-pay | Admitting: Internal Medicine

## 2021-02-18 ENCOUNTER — Other Ambulatory Visit: Payer: Self-pay | Admitting: Internal Medicine

## 2021-03-10 ENCOUNTER — Other Ambulatory Visit: Payer: Self-pay | Admitting: Internal Medicine

## 2021-03-11 ENCOUNTER — Telehealth: Payer: Self-pay | Admitting: Internal Medicine

## 2021-03-11 MED ORDER — AMLODIPINE BESYLATE 5 MG PO TABS: 5.0000 mg | ORAL_TABLET | Freq: Every day | ORAL | 0 refills | Status: DC

## 2021-03-11 NOTE — Telephone Encounter (Signed)
Pt. Scheduled for 10/4 at 3:20

## 2021-03-11 NOTE — Telephone Encounter (Signed)
Pt overdue for visit.

## 2021-03-11 NOTE — Telephone Encounter (Signed)
15 day supply sent.  

## 2021-03-11 NOTE — Telephone Encounter (Signed)
Medication:  amLODipine (NORVASC) 5 MG tablet  Has the patient contacted their pharmacy? No. (If no, request that the patient contact the pharmacy for the refill.) (If yes, when and what did the pharmacy advise?)  Preferred Pharmacy (with phone number or street name): [id number] - [place], [place]. AT [place]  [address] C[address] [place] [address]  Phone:  [phone]    Agent: Please be advised that RX refills may take up to 3 business days. We ask that you follow-up with your pharmacy.

## 2021-03-18 ENCOUNTER — Encounter: Payer: Self-pay | Admitting: Internal Medicine

## 2021-03-18 ENCOUNTER — Ambulatory Visit (INDEPENDENT_AMBULATORY_CARE_PROVIDER_SITE_OTHER): Payer: Medicare HMO | Admitting: Internal Medicine

## 2021-03-18 ENCOUNTER — Other Ambulatory Visit: Payer: Self-pay

## 2021-03-18 VITALS — BP 122/80 | HR 76 | Temp 98.2°F | Resp 18 | Ht 70.0 in | Wt 164.1 lb

## 2021-03-18 DIAGNOSIS — I10 Essential (primary) hypertension: Secondary | ICD-10-CM

## 2021-03-18 DIAGNOSIS — K219 Gastro-esophageal reflux disease without esophagitis: Secondary | ICD-10-CM

## 2021-03-18 DIAGNOSIS — R0789 Other chest pain: Secondary | ICD-10-CM | POA: Diagnosis not present

## 2021-03-18 MED ORDER — PANTOPRAZOLE SODIUM 40 MG PO TBEC: 40.0000 mg | DELAYED_RELEASE_TABLET | Freq: Two times a day (BID) | ORAL | 1 refills | Status: DC

## 2021-03-18 MED ORDER — AMLODIPINE BESYLATE 5 MG PO TABS: 5.0000 mg | ORAL_TABLET | Freq: Every day | ORAL | 1 refills | Status: DC

## 2021-03-18 MED ORDER — LOSARTAN POTASSIUM 50 MG PO TABS: 50.0000 mg | ORAL_TABLET | Freq: Every day | ORAL | 1 refills | Status: DC

## 2021-03-18 NOTE — Progress Notes (Signed)
Subjective:    Patient ID: Cory Zavala, male    DOB: [birth year], 87 y.o.   MRN: [id number]  DOS:  03/18/2021 Type of visit - description: f/u Since the last office visit, he saw cardiology, note reviewed.  Also, has noted GERD symptoms: They are more persistent and intense. He reports is taking pantoprazole. No weight loss, no abdominal pain. No change in the color of the stools Mild dysphagia?  No odynophagia.  Noted a lump near the left eye, that happened a week ago, it was big and tender and now is getting better.  HTN: Needs multiple refills   Review of Systems See above   Past Medical History:  Diagnosis Date   Back pain, chronic    BPH (benign prostatic hypertrophy)    Diverticulosis of colon    LEFT   ED (erectile dysfunction)    GERD (gastroesophageal reflux disease) 09/15/2012   Gross hematuria    2015   HTN (hypertension)    Nocturia    Normal cardiac stress test    PER CARDIOLOGIST NOTE, DR NISHAN, APPROX.  1994   Osteoporosis    RBBB     Past Surgical History:  Procedure Laterality Date   CATARACT EXTRACTION W/ INTRAOCULAR LENS  IMPLANT, BILATERAL  2010   CYSTOSCOPY WITH RETROGRADE PYELOGRAM, URETEROSCOPY AND STENT PLACEMENT Right 12/25/2013   Procedure: CYSTOSCOPY WITH RIGHT RETROGRADE PYELOGRAM, RIGHT URETEROSCOPY  BIOPSY AND STENT: BLADDER BIOPSY;  Surgeon: Claybon Jabs, MD;  Location: [facility];  Service: Urology;  Laterality: Right;   VERTEBROPLASTY  2001    Allergies as of 03/18/2021       Reactions   Bee Venom Anaphylaxis        Medication List        Accurate as of March 18, 2021 11:59 PM. If you have any questions, ask your nurse or doctor.          amLODipine 5 MG tablet Commonly known as: NORVASC Take 1 tablet (5 mg total) by mouth daily.   D3 ADULT PO Take by mouth.   EPINEPHrine 0.3 mg/0.3 mL Soaj injection Commonly known as: EPI-PEN Inject 0.3 mg into the muscle as needed for anaphylaxis. INJECT  0.3MLS(CONTENTS OF 1 SYRINGE) INTO THE MUSCLE AS DIRECTED   hydrocortisone 25 MG suppository Commonly known as: ANUSOL-HC Place 1 suppository (25 mg total) rectally 2 (two) times daily as needed for hemorrhoids.   loratadine 10 MG tablet Commonly known as: Claritin Take 1-2 tablets 1-2 times per day   losartan 50 MG tablet Commonly known as: COZAAR Take 1 tablet (50 mg total) by mouth daily.   multivitamin tablet Take 1 tablet by mouth daily.   OMEGA-3 FATTY ACIDS PO Take 1 tablet by mouth daily.   OSTEO ADVANCE PO Take 4 tablets by mouth daily. OSTEO GOLD (BRAND)   pantoprazole 40 MG tablet Commonly known as: PROTONIX Take 1 tablet (40 mg total) by mouth 2 (two) times daily before a meal. What changed: when to take this Changed by: Kathlene November, MD   PreserVision AREDS Caps Take 1 capsule by mouth daily.   vitamin B-12 500 MCG tablet Commonly known as: CYANOCOBALAMIN Take 500 mcg by mouth daily.           Objective:   Physical Exam Eyes:      Comments: Has a cystlike superficial lump, no fluctuance, no redness, no hyperpigmentation or moles.   BP 122/80 (BP Location: Left Arm, Patient Position: Sitting, Cuff Size:  Small)   Pulse 76   Temp 98.2 F (36.8 C) (Oral)   Resp 18   Ht 5\' 10"  (1.778 m)   Wt 164 lb 2 oz (74.4 kg)   SpO2 98%   BMI 23.55 kg/m  General:   Well developed, NAD, BMI noted. HEENT:  Normocephalic . Face symmetric, atraumatic Neck: No lymphadenopathies, no supraclavicular mass. Lungs:  CTA B Normal respiratory effort, no intercostal retractions, no accessory muscle use. Heart: RRR,  no murmur. Abdomen: Not distended, soft, nontender Lower extremities: no pretibial edema bilaterally  Skin: Not pale. Not jaundice Neurologic:  alert & oriented X3.  Speech normal, gait appropriate for age and unassisted Psych--  Cognition and judgment appear intact.  Cooperative with normal attention span and concentration.  Behavior appropriate. No  anxious or depressed appearing.      Assessment    Assessment   Hyperglycemia HTN GERD RBBB MSK: --Chronic back pain -- Vertebroplasty 2001 -- Osteoporosis T score -3.3 (2014), declined all meds 2014 and  2019 GU: ---BPH ---ED ---Gross hematuria 2015  Initial CT show a question of urothelial cancer, subsequently a ureteroscopy was done and reportedly normal, subsequently a biopsy was negative. Follow-up MRI 01-2014 show improvement. Saw urology ~ 04-2015 Exercise stress test 2014 negative Bee sting allergies: has a Epi pen   PLAN: HTN: BP looks very good, refill amlodipine, losartan.  Last BMP okay. GERD: This issue has been silent for a while, now reports more steady symptoms for about 1 month.  No red flag symptoms except mild dysphagia to solids and liquids. Plan: Increase pantoprazole to twice daily, GI precautions, refer to GI if not better, see AVS  Cyst?  Skin lesion near the left eye likely a cyst.  It is getting better spontaneously, recommend observation.  Reassess in 2 months CP: Saw cardiology 10/2020 due to chest pain, no further eval was recommended.  Follow-up as needed Preventive care: Recommend COVID booster, Shingrix No. 2, flu shot at the pharmacy RTC 2 months    This visit occurred during the SARS-CoV-2 public health emergency.  Safety protocols were in place, including screening questions prior to the visit, additional usage of staff PPE, and extensive cleaning of exam room while observing appropriate contact time as indicated for disinfecting solutions.

## 2021-03-18 NOTE — Patient Instructions (Addendum)
Increase pantoprazole to 1 tablet twice a day, before breakfast and before supper.   If your acid reflux does not improve in the next 2 to 3 weeks let me know daily    GO TO THE FRONT DESK, PLEASE SCHEDULE YOUR APPOINTMENTS Come back for a physical exam in 2 months

## 2021-03-19 NOTE — Assessment & Plan Note (Signed)
HTN: BP looks very good, refill amlodipine, losartan.  Last BMP okay. GERD: This issue has been silent for a while, now reports more steady symptoms for about 1 month.  No red flag symptoms except mild dysphagia to solids and liquids. Plan: Increase pantoprazole to twice daily, GI precautions, refer to GI if not better, see AVS  Cyst?  Skin lesion near the left eye likely a cyst.  It is getting better spontaneously, recommend observation.  Reassess in 2 months CP: Saw cardiology 10/2020 due to chest pain, no further eval was recommended.  Follow-up as needed Preventive care: Recommend COVID booster, Shingrix No. 2, flu shot at the pharmacy RTC 2 months

## 2021-03-24 DIAGNOSIS — R262 Difficulty in walking, not elsewhere classified: Secondary | ICD-10-CM | POA: Diagnosis not present

## 2021-03-24 DIAGNOSIS — M25561 Pain in right knee: Secondary | ICD-10-CM | POA: Diagnosis not present

## 2021-04-01 DIAGNOSIS — M25561 Pain in right knee: Secondary | ICD-10-CM | POA: Diagnosis not present

## 2021-04-01 DIAGNOSIS — R262 Difficulty in walking, not elsewhere classified: Secondary | ICD-10-CM | POA: Diagnosis not present

## 2021-04-07 ENCOUNTER — Telehealth: Payer: Self-pay | Admitting: Internal Medicine

## 2021-04-07 MED ORDER — AMLODIPINE BESYLATE 5 MG PO TABS: 5.0000 mg | ORAL_TABLET | Freq: Every day | ORAL | 1 refills | Status: DC

## 2021-04-07 NOTE — Telephone Encounter (Signed)
Pt stated he called the pharmacy said they needed authorization for a refill of amLODipine (NORVASC) 5 MG tablet   please advise.    [id number] - [place], [place] - [place]. AT [place]  [address] [place] [address]  Phone:  [phone]  Fax:  [phone]

## 2021-04-07 NOTE — Telephone Encounter (Signed)
Refills sent on 03/18/21- however will resend.

## 2021-04-09 ENCOUNTER — Other Ambulatory Visit: Payer: Self-pay | Admitting: Internal Medicine

## 2021-04-10 DIAGNOSIS — M1711 Unilateral primary osteoarthritis, right knee: Secondary | ICD-10-CM | POA: Diagnosis not present

## 2021-05-19 ENCOUNTER — Ambulatory Visit (INDEPENDENT_AMBULATORY_CARE_PROVIDER_SITE_OTHER): Payer: Medicare HMO | Admitting: Internal Medicine

## 2021-05-19 ENCOUNTER — Encounter: Payer: Self-pay | Admitting: Internal Medicine

## 2021-05-19 VITALS — BP 134/80 | HR 98 | Temp 98.2°F | Resp 16 | Ht 70.0 in | Wt 164.1 lb

## 2021-05-19 DIAGNOSIS — R739 Hyperglycemia, unspecified: Secondary | ICD-10-CM | POA: Diagnosis not present

## 2021-05-19 DIAGNOSIS — Z0000 Encounter for general adult medical examination without abnormal findings: Secondary | ICD-10-CM

## 2021-05-19 DIAGNOSIS — Z23 Encounter for immunization: Secondary | ICD-10-CM

## 2021-05-19 DIAGNOSIS — I10 Essential (primary) hypertension: Secondary | ICD-10-CM | POA: Diagnosis not present

## 2021-05-19 LAB — BASIC METABOLIC PANEL WITH GFR
BUN: 20 mg/dL (ref 6–23)
CO2: 27 meq/L (ref 19–32)
Calcium: 9.2 mg/dL (ref 8.4–10.5)
Chloride: 105 meq/L (ref 96–112)
Creatinine, Ser: 1.11 mg/dL (ref 0.40–1.50)
GFR: 59.12 mL/min — ABNORMAL LOW
Glucose, Bld: 128 mg/dL — ABNORMAL HIGH (ref 70–99)
Potassium: 4.3 meq/L (ref 3.5–5.1)
Sodium: 142 meq/L (ref 135–145)

## 2021-05-19 LAB — HEMOGLOBIN A1C: Hgb A1c MFr Bld: 6 % (ref 4.6–6.5)

## 2021-05-19 NOTE — Patient Instructions (Addendum)
Check the  blood pressure from time to time. BP GOAL is between 110/65 and  135/85. If it is consistently higher or lower, let me know   GO TO THE LAB : Get the blood work     [place], [place] back for a checkup in 8 to 10 months

## 2021-05-19 NOTE — Progress Notes (Signed)
Subjective:    Patient ID: Cory Zavala, male    DOB: 21-Jan-1931, 87 y.o.   MRN: [id number]  DOS:  05/19/2021 Type of visit - description: CPX  Here for CPX. Still have LUTS mostly nocturia. Having problems with the right knee, did physical therapy, eventually got a local injection and now feels better.   Review of Systems  Other than above, a 14 point review of systems is negative    Past Medical History:  Diagnosis Date   Back pain, chronic    BPH (benign prostatic hypertrophy)    Diverticulosis of colon    LEFT   ED (erectile dysfunction)    GERD (gastroesophageal reflux disease) 09/15/2012   Gross hematuria    2015   HTN (hypertension)    Nocturia    Normal cardiac stress test    PER CARDIOLOGIST NOTE, DR NISHAN, APPROX.  1994   Osteoporosis    RBBB     Past Surgical History:  Procedure Laterality Date   CATARACT EXTRACTION W/ INTRAOCULAR LENS  IMPLANT, BILATERAL  2010   CYSTOSCOPY WITH RETROGRADE PYELOGRAM, URETEROSCOPY AND STENT PLACEMENT Right 12/25/2013   Procedure: CYSTOSCOPY WITH RIGHT RETROGRADE PYELOGRAM, RIGHT URETEROSCOPY  BIOPSY AND STENT: BLADDER BIOPSY;  Surgeon: Claybon Jabs, MD;  Location: [facility];  Service: Urology;  Laterality: Right;   VERTEBROPLASTY  2001   Social History   Socioeconomic History   Marital status: Married    Spouse name: Not on file   Number of children: 2   Years of education: Not on file   Highest education level: Not on file  Occupational History   Occupation: Theme park manager, semi retired     Fish farm manager: RETIRED  Tobacco Use   Smoking status: Never   Smokeless tobacco: Never  Substance and Sexual Activity   Alcohol use: No   Drug use: No   Sexual activity: Not on file  Other Topics Concern   Not on file  Social History Narrative   Retired, patient was a Company secretary still preachs, Educational psychologist   Married > 30 years, lost wife aprox 5-11. remarried         Social Determinants of Health   Financial  Resource Strain: Not on file  Food Insecurity: Not on file  Transportation Needs: Not on file  Physical Activity: Not on file  Stress: Not on file  Social Connections: Not on file  Intimate Partner Violence: Not on file    Allergies as of 05/19/2021       Reactions   Bee Venom Anaphylaxis        Medication List        Accurate as of May 19, 2021  4:36 PM. If you have any questions, ask your nurse or doctor.          amLODipine 5 MG tablet Commonly known as: NORVASC Take 1 tablet (5 mg total) by mouth daily.   D3 ADULT PO Take by mouth.   EPINEPHrine 0.3 mg/0.3 mL Soaj injection Commonly known as: EPI-PEN Inject 0.3 mg into the muscle as needed for anaphylaxis. INJECT 0.3MLS(CONTENTS OF 1 SYRINGE) INTO THE MUSCLE AS DIRECTED   hydrocortisone 25 MG suppository Commonly known as: ANUSOL-HC Place 1 suppository (25 mg total) rectally 2 (two) times daily as needed for hemorrhoids.   loratadine 10 MG tablet Commonly known as: Claritin Take 1-2 tablets 1-2 times per day   losartan 50 MG tablet Commonly known as: COZAAR Take 1 tablet (50 mg total) by mouth daily.  multivitamin tablet Take 1 tablet by mouth daily.   OMEGA-3 FATTY ACIDS PO Take 1 tablet by mouth daily.   OSTEO ADVANCE PO Take 4 tablets by mouth daily. OSTEO GOLD (BRAND)   pantoprazole 40 MG tablet Commonly known as: PROTONIX TAKE 1 TABLET BY MOUTH EVERY DAY   PreserVision AREDS Caps Take 1 capsule by mouth daily.   vitamin B-12 500 MCG tablet Commonly known as: CYANOCOBALAMIN Take 500 mcg by mouth daily.           Objective:   Physical Exam BP 134/80 (BP Location: Left Arm, Patient Position: Sitting, Cuff Size: Small)   Pulse 98   Temp 98.2 F (36.8 C) (Oral)   Resp 16   Ht 5\' 10"  (1.778 m)   Wt 164 lb 2 oz (74.4 kg)   SpO2 95%   BMI 23.55 kg/m  General: Well developed, NAD, BMI noted.  Somewhat frail. Neck: No  thyromegaly  HEENT:  Normocephalic . Face symmetric,  atraumatic Lungs:  CTA B Normal respiratory effort, no intercostal retractions, no accessory muscle use. Heart: RRR,  no murmur.  Abdomen:  Not distended, soft, non-tender. No rebound or rigidity.   Lower extremities: no pretibial edema bilaterally  Skin: Exposed areas without rash. Not pale. Not jaundice Neurologic:  alert & oriented X3.  Speech normal, gait appropriate for age and unassisted.  Needs help is transferring Strength symmetric and appropriate for age.  Psych: Cognition and judgment appear intact.  Cooperative with normal attention span and concentration.  Behavior appropriate. No anxious or depressed appearing.     Assessment    Assessment   Hyperglycemia HTN GERD RBBB MSK: --Chronic back pain -- Vertebroplasty 2001 -- Osteoporosis T score -3.3 (2014), declined all meds 2014 and  2019 GU: ---BPH ---ED ---Gross hematuria 2015  Initial CT show a question of urothelial cancer, subsequently a ureteroscopy was done and reportedly normal, subsequently a biopsy was negative. Follow-up MRI 01-2014 show improvement. Saw urology ~ 04-2015 Exercise stress test 2014 negative Bee sting allergies: has a Epi pen   PLAN: Here for CPX Hyperglycemia: Checking A1c HTN, BP is very good, reports normal ambulatory BPs, continue amlodipine and losartan DJD: Sees orthopedics for knee pain. BPH: Has not seen urology lately, does not plan to see them unless symptoms increase. RTC 8 to 10 months    This visit occurred during the SARS-CoV-2 public health emergency.  Safety protocols were in place, including screening questions prior to the visit, additional usage of staff PPE, and extensive cleaning of exam room while observing appropriate contact time as indicated for disinfecting solutions.

## 2021-05-19 NOTE — Assessment & Plan Note (Signed)
Here for CPX Hyperglycemia: Checking A1c HTN, BP is very good, reports normal ambulatory BPs, continue amlodipine and losartan DJD: Sees orthopedics for knee pain. BPH: Has not seen urology lately, does not plan to see them unless symptoms increase. RTC 8 to 10 months

## 2021-05-19 NOTE — Assessment & Plan Note (Signed)
-  Td 2013 - PNM [id number], 05-2009; PNM 23 booster today -prevnar 2016 - s/p zostavax & Shingrix  - Covid booster : UTD - had a flu shot  -CCS:  Not interested on further CCS. -prostate ca screening:  see urology as needed, see comments under BPH. -Advance care planning package of information provided -Labs reviewed, recommend they BMP A1c

## 2021-06-27 ENCOUNTER — Other Ambulatory Visit: Payer: Self-pay

## 2021-06-27 ENCOUNTER — Encounter: Payer: Self-pay | Admitting: Internal Medicine

## 2021-06-27 ENCOUNTER — Ambulatory Visit (INDEPENDENT_AMBULATORY_CARE_PROVIDER_SITE_OTHER): Payer: Medicare HMO | Admitting: Internal Medicine

## 2021-06-27 ENCOUNTER — Ambulatory Visit (HOSPITAL_BASED_OUTPATIENT_CLINIC_OR_DEPARTMENT_OTHER)
Admission: RE | Admit: 2021-06-27 | Discharge: 2021-06-27 | Disposition: A | Discharge status: Home / Self Care | Payer: Medicare HMO | Source: Ambulatory Visit | Attending: Internal Medicine | Admitting: Internal Medicine

## 2021-06-27 VITALS — BP 146/94 | HR 48 | Temp 98.4°F | Resp 16 | Ht 70.0 in | Wt 159.1 lb

## 2021-06-27 DIAGNOSIS — I700 Atherosclerosis of aorta: Secondary | ICD-10-CM | POA: Diagnosis not present

## 2021-06-27 DIAGNOSIS — K2100 Gastro-esophageal reflux disease with esophagitis, without bleeding: Secondary | ICD-10-CM

## 2021-06-27 DIAGNOSIS — R1310 Dysphagia, unspecified: Secondary | ICD-10-CM

## 2021-06-27 DIAGNOSIS — M4854XA Collapsed vertebra, not elsewhere classified, thoracic region, initial encounter for fracture: Secondary | ICD-10-CM | POA: Diagnosis not present

## 2021-06-27 MED ORDER — PANTOPRAZOLE SODIUM 40 MG PO TBEC
40.0000 mg | DELAYED_RELEASE_TABLET | Freq: Two times a day (BID) | ORAL | 0 refills | Status: DC
Start: 2021-06-27 — End: 2022-01-15

## 2021-06-27 NOTE — Progress Notes (Signed)
Subjective:    Patient ID: Cory Zavala, male    DOB: Jan 20, 1931, 88 y.o.   MRN: [id number]  DOS:  06/27/2021 Type of visit - description: acute   Symptoms started suddenly about a week ago: Difficulty swallowing both liquids and solids, some odynophagia. He does not recall any particular food or pill that triggered the symptoms.  Overall, yesterday and today symptoms have decreased.  Denies fever chills No chest pain or difficulty breathing Admits to hiccups for the last few days. Denies any nausea, abdominal pain, change in the color of the stools, no constipation. + Heartburn despite taking pantoprazole.  It used to be under better control.  Wt Readings from Last 3 Encounters:  06/27/21 159 lb 2 oz (72.2 kg)  05/19/21 164 lb 2 oz (74.4 kg)  03/18/21 164 lb 2 oz (74.4 kg)    Review of Systems See above   Past Medical History:  Diagnosis Date   Back pain, chronic    BPH (benign prostatic hypertrophy)    Diverticulosis of colon    LEFT   ED (erectile dysfunction)    GERD (gastroesophageal reflux disease) 09/15/2012   Gross hematuria    2015   HTN (hypertension)    Nocturia    Normal cardiac stress test    PER CARDIOLOGIST NOTE, DR NISHAN, APPROX.  1994   Osteoporosis    RBBB     Past Surgical History:  Procedure Laterality Date   CATARACT EXTRACTION W/ INTRAOCULAR LENS  IMPLANT, BILATERAL  2010   CYSTOSCOPY WITH RETROGRADE PYELOGRAM, URETEROSCOPY AND STENT PLACEMENT Right 12/25/2013   Procedure: CYSTOSCOPY WITH RIGHT RETROGRADE PYELOGRAM, RIGHT URETEROSCOPY  BIOPSY AND STENT: BLADDER BIOPSY;  Surgeon: Claybon Jabs, MD;  Location: [facility];  Service: Urology;  Laterality: Right;   VERTEBROPLASTY  2001    Current Outpatient Medications  Medication Instructions   amLODipine (NORVASC) 5 mg, Oral, Daily   Cholecalciferol (D3 ADULT PO) Oral   EPINEPHrine (EPI-PEN) 0.3 mg, Intramuscular, As needed, INJECT 0.3MLS(CONTENTS OF 1 SYRINGE) INTO THE  MUSCLE AS DIRECTED   hydrocortisone (ANUSOL-HC) 25 mg, Rectal, 2 times daily PRN   loratadine (CLARITIN) 10 MG tablet Take 1-2 tablets 1-2 times per day   losartan (COZAAR) 50 mg, Oral, Daily   Multiple Vitamin (MULTIVITAMIN) tablet 1 tablet, Daily   Multiple Vitamins-Minerals (PRESERVISION AREDS) CAPS 1 capsule, Oral, Daily   Nutritional Supplements (OSTEO ADVANCE PO) 4 tablets, Oral, Daily, OSTEO GOLD (BRAND)    OMEGA-3 FATTY ACIDS PO 1 tablet, Oral, Daily   pantoprazole (PROTONIX) 40 MG tablet TAKE 1 TABLET BY MOUTH EVERY DAY   vitamin B-12 (CYANOCOBALAMIN) 500 mcg, Daily       Objective:   Physical Exam BP (!) 146/94 (BP Location: Left Arm, Patient Position: Sitting, Cuff Size: Normal)    Pulse (!) 48    Temp 98.4 F (36.9 C) (Oral)    Resp 16    Ht 5\' 10"  (1.778 m)    Wt 159 lb 2 oz (72.2 kg)    SpO2 95%    BMI 22.83 kg/m  General:   Well developed, NAD, BMI noted.  HEENT:  Normocephalic . Face symmetric, atraumatic. Neck: No lymphadenopathies at the neck or supraclavicular areas Lungs:  CTA B Normal respiratory effort, no intercostal retractions, no accessory muscle use. Heart: RRR,  no murmur.  Abdomen:  Not distended, soft, non-tender. No rebound or rigidity.   Skin: Not pale. Not jaundice Lower extremities: no pretibial edema bilaterally  Neurologic:  alert & oriented X3.  Speech normal, gait appropriate for age and unassisted Psych--  Cognition and judgment appear intact.  Cooperative with normal attention span and concentration.  Behavior appropriate. No anxious or depressed appearing.     Assessment      Assessment   Hyperglycemia HTN GERD RBBB MSK: --Chronic back pain -- Vertebroplasty 2001 -- Osteoporosis T score -3.3 (2014), declined all meds 2014 and  2019 GU: ---BPH ---ED ---Gross hematuria 2015  Initial CT show a question of urothelial cancer, subsequently a ureteroscopy was done and reportedly normal, subsequently a biopsy was  negative. Follow-up MRI 01-2014 show improvement. Saw urology ~ 04-2015 Exercise stress test 2014 negative Bee sting allergies: has a Epi pen   PLAN: Dysphagia: Acute onset of dysphagia a week ago, to solids and liquids, slightly better in the last 2 days.  No change in the color of the stools, mild weight loss noted. DDx includes acute esophagitis, pill esophagitis versus other.  He is 88 years old. Plan: Criss Rosales diet, increase pantoprazole to twice daily, chest x-ray, GI referral, if he is not improving, they might consider EGD. Patient verbalized understanding GERD: See above   This visit occurred during the SARS-CoV-2 public health emergency.  Safety protocols were in place, including screening questions prior to the visit, additional usage of staff PPE, and extensive cleaning of exam room while observing appropriate contact time as indicated for disinfecting solutions.

## 2021-06-27 NOTE — Assessment & Plan Note (Signed)
Dysphagia: Acute onset of dysphagia a week ago, to solids and liquids, slightly better in the last 2 days.  No change in the color of the stools, mild weight loss noted. DDx includes acute esophagitis, pill esophagitis versus other.  He is 88 years old. Plan: Criss Rosales diet, increase pantoprazole to twice daily, chest x-ray, GI referral, if he is not improving, they might consider EGD. Patient verbalized understanding GERD: See above

## 2021-06-27 NOTE — Patient Instructions (Signed)
Follow a bland diet  Increase pantoprazole to 1 tablet before breakfast and 1 before dinner  Stop by the first floor and get a chest x-ray  We are referring you to the gastroenterology office.  Please call and make arrangements at [phone]

## 2021-07-01 ENCOUNTER — Ambulatory Visit: Payer: Medicare HMO | Admitting: Gastroenterology

## 2021-07-02 ENCOUNTER — Ambulatory Visit: Payer: Medicare HMO | Admitting: Nurse Practitioner

## 2021-07-02 ENCOUNTER — Encounter: Payer: Self-pay | Admitting: Nurse Practitioner

## 2021-07-02 VITALS — BP 140/82 | HR 75 | Ht 70.0 in | Wt 159.4 lb

## 2021-07-02 DIAGNOSIS — K2100 Gastro-esophageal reflux disease with esophagitis, without bleeding: Secondary | ICD-10-CM

## 2021-07-02 DIAGNOSIS — R1319 Other dysphagia: Secondary | ICD-10-CM | POA: Diagnosis not present

## 2021-07-02 NOTE — Patient Instructions (Addendum)
It was great seeing you today! Thank you for entrusting me with your care and choosing [facility].  Noralyn Pick, CRNP  IMAGING: You will be contacted by [facility] (Your caller ID will indicate phone # [phone]) in the next 7 days to schedule your Barium Swallow Test. If you have not heard from them within 7 business days, please call [facility] at [phone] to follow up on the status of your appointment.    RECOMMENDATIONS: Continue Pantoprazole 40 MG twice a day. Soft food diet, avoid large pieces of meat and bread. Cut food into small pieces and chew food thoroughly.  Go to the emergency room if food/water gets stuck in the esophagus and does not pass.  The [facility] providers would like to encourage you to use [facility] to communicate with providers for non-urgent requests or questions.  Due to long hold times on the telephone, sending your provider a message by [facility] may be faster and more efficient way to get a response. Please allow 48 business hours for a response.  Please remember that this is for non-urgent requests/questions. If you are age 52 or older, your body mass index should be between 23-30. Your Body mass index is 22.87 kg/m. If this is out of the aforementioned range listed, please consider follow up with your Primary Care Provider.  If you are age 53 or younger, your body mass index should be between 19-25. Your Body mass index is 22.87 kg/m. If this is out of the aformentioned range listed, please consider follow up with your Primary Care Provider.

## 2021-07-02 NOTE — Progress Notes (Signed)
07/02/2021 Cory Zavala [id number] 1930/12/27   CHIEF COMPLAINT: Dysphagia  HISTORY OF PRESENT ILLNESS: Cory Zavala is an 88 year old male with a past medical history significant for hypertension, right BBB, BPH, hematuria 2015, GERD and diverticulosis.  He presents to our office today as referred by Dr. Belinda Fisher for further evaluation regarding dysphagia.  He reports taking Pantoprazole 40 mg daily for the past 2 to 3 years due to having heartburn, frequent throat clearing and dysphagia.  His GERD/dysphagia symptoms improved after he started Pantoprazole QD. He developed acute onset of dysphagia approximately 10 days ago.  At that time, he had difficulty swallowing liquids and solid foods which became stuck to the upper esophagus resulting in a tightening sensation "like a balloon was being inflated" which lasted for about 30 minutes before being released.  He denied coughing or vomiting out any liquid or food during these episodes. No associated dizziness, palpitations or shortness of breath.  He had recent hiccups during the day and night for approximately 3 days which abated.  He has occasional heartburn.  No upper or lower abdominal pain.  He denies ever having an EGD.  He reported losing 10 pounds over the past 2 weeks per his home scale.  He typically weighs 162 pounds in the morning and he stated his home weight is down to 152 pounds.  No fevers. He is passing a normal formed brown bowel movement daily.  No rectal bleeding or black stools.  He underwent at least 2 colonoscopies in his lifetime which he reported were normal, no polyps.  He is a non-smoker.  No alcohol use.  He took Advil 200 mg 2 tabs once daily for 5 to 6 days about 1 month ago when he had URI symptoms.  No antibiotic or steroid use within the past 3 to 4 months.  He remains quite active.  No history of cardiac or pulmonary disease.  CBC Latest Ref Rng & Units 08/28/2020 03/29/2020 09/22/2019  WBC 4.0 - 10.5 K/uL 8.0 7.1  6.4  Hemoglobin 13.0 - 17.0 g/dL 15.2 14.1 14.1  Hematocrit 39.0 - 52.0 % 46.3 42.0 41.8  Platelets 150 - 400 K/uL 217 193 187.0    CMP Latest Ref Rng & Units 05/19/2021 08/28/2020 03/29/2020  Glucose 70 - 99 mg/dL 128(H) 110(H) 98  BUN 6 - 23 mg/dL 20 19 22   Creatinine 0.40 - 1.50 mg/dL 1.11 1.20 1.17(H)  Sodium 135 - 145 mEq/L 142 142 143  Potassium 3.5 - 5.1 mEq/L 4.3 4.2 4.1  Chloride 96 - 112 mEq/L 105 107 109  CO2 19 - 32 mEq/L 27 29 27   Calcium 8.4 - 10.5 mg/dL 9.2 9.1 9.0  Total Protein 6.5 - 8.1 g/dL - 6.4(L) -  Total Bilirubin 0.3 - 1.2 mg/dL - 1.0 -  Alkaline Phos 38 - 126 U/L - 73 -  AST 15 - 41 U/L - 21 -  ALT 0 - 44 U/L - 15 -     Past Medical History:  Diagnosis Date   Back pain, chronic    BPH (benign prostatic hypertrophy)    Diverticulosis of colon    LEFT   ED (erectile dysfunction)    GERD (gastroesophageal reflux disease) 09/15/2012   Gross hematuria    2015   HTN (hypertension)    Nocturia    Normal cardiac stress test    PER CARDIOLOGIST NOTE, DR NISHAN, APPROX.  1994   Osteoporosis    RBBB    Past  Surgical History:  Procedure Laterality Date   CATARACT EXTRACTION W/ INTRAOCULAR LENS  IMPLANT, BILATERAL  2010   CYSTOSCOPY WITH RETROGRADE PYELOGRAM, URETEROSCOPY AND STENT PLACEMENT Right 12/25/2013   Procedure: CYSTOSCOPY WITH RIGHT RETROGRADE PYELOGRAM, RIGHT URETEROSCOPY  BIOPSY AND STENT: BLADDER BIOPSY;  Surgeon: Claybon Jabs, MD;  Location: [facility];  Service: Urology;  Laterality: Right;   VERTEBROPLASTY  2001   Social History: He is married (widowed then re-married).  He is a Company secretary.  Nonsmoker. No alcohol. No drug use.   Family History: No known family history of esophageal, gastric or colon cancer.  He had 6 siblings, all deceased.   Allergies  Allergen Reactions   Bee Venom Anaphylaxis     Outpatient Encounter Medications as of 07/02/2021  Medication Sig   amLODipine (NORVASC) 5 MG tablet Take 1 tablet (5 mg  total) by mouth daily.   Cholecalciferol (D3 ADULT PO) Take by mouth.   EPINEPHrine 0.3 mg/0.3 mL IJ SOAJ injection Inject 0.3 mg into the muscle as needed for anaphylaxis. INJECT 0.3MLS(CONTENTS OF 1 SYRINGE) INTO THE MUSCLE AS DIRECTED   hydrocortisone (ANUSOL-HC) 25 MG suppository Place 1 suppository (25 mg total) rectally 2 (two) times daily as needed for hemorrhoids.   loratadine (CLARITIN) 10 MG tablet Take 1-2 tablets 1-2 times per day   losartan (COZAAR) 50 MG tablet Take 1 tablet (50 mg total) by mouth daily.   Multiple Vitamin (MULTIVITAMIN) tablet Take 1 tablet by mouth daily.   Multiple Vitamins-Minerals (PRESERVISION AREDS) CAPS Take 1 capsule by mouth daily.   Nutritional Supplements (OSTEO ADVANCE PO) Take 4 tablets by mouth daily. OSTEO GOLD (BRAND)   OMEGA-3 FATTY ACIDS PO Take 1 tablet by mouth daily.   pantoprazole (PROTONIX) 40 MG tablet Take 1 tablet (40 mg total) by mouth 2 (two) times daily before a meal.   vitamin B-12 (CYANOCOBALAMIN) 500 MCG tablet Take 500 mcg by mouth daily.   No facility-administered encounter medications on file as of 07/02/2021.   REVIEW OF SYSTEMS: Gen: Denies fever, sweats or chills. No weight loss.  CV: Denies chest pain, palpitations or edema. Resp: Denies cough, shortness of breath of hemoptysis.  GI: See HPI.   GU : Denies urinary burning, blood in urine, increased urinary frequency or incontinence. MS: + Right knee pain. Derm: Denies rash, itchiness, skin lesions or unhealing ulcers. Psych: Denies depression, anxiety or memory loss. Heme: Denies bruising, bleeding. Neuro:  Denies headaches, dizziness or paresthesias. Endo:  Denies any problems with DM, thyroid or adrenal function.  PHYSICAL EXAM: BP 140/82    Pulse 75    Ht 5\' 10"  (1.778 m)    Wt 159 lb 6 oz (72.3 kg)    SpO2 99%    BMI 22.87 kg/m   Wt Readings from Last 3 Encounters:  07/02/21 159 lb 6 oz (72.3 kg)  06/27/21 159 lb 2 oz (72.2 kg)  05/19/21 164 lb 2 oz (74.4 kg)     General: Delightful 88 year old male cognitively grossly intact in no acute distress. Head: Normocephalic and atraumatic. Eyes:  Sclerae non-icteric, conjunctive pink. Ears: Normal auditory acuity. Mouth: Dentition intact. No ulcers or lesions.  Neck: Supple, no lymphadenopathy or thyromegaly.  Lungs: Clear bilaterally to auscultation without wheezes, crackles or rhonchi. Heart: Regular rate and rhythm.  Loud S1.  No murmur, rub or gallop appreciated.  Abdomen: Soft, nontender, non distended. No masses. No hepatosplenomegaly. Normoactive bowel sounds x 4 quadrants.  Rectal: Deferred. Musculoskeletal: Symmetrical with no gross deformities.  Skin: Warm and dry. No rash or lesions on visible extremities. Extremities: No edema. Neurological: Alert oriented x 4, no focal deficits.  Psychological:  Alert and cooperative. Normal mood and affect.  ASSESSMENT AND PLAN:  18) 88 year old male with a history of GERD with abrupt onset dysphagia with solid and liquids foods about 10 days ago with associated weight loss.  Previously on Pantoprazole 40 mg daily, Pantoprazole 40mg  was increased to twice daily dosing and his symptoms improved.  He is tolerating a soft diet.  -Patient prefers to proceed with a barium swallow with tablet prior to pursuing an EGD -Await results of barium swallow study, he will likely require an EGD -EGD benefits and risks discussed including risk with sedation, risk of bleeding, perforation and infection  -Continue Pantoprazole 40 mg p.o. twice daily -Soft diet as tolerated -Patient instructed to avoid dry pieces of bread, large pieces of meat and rice, cut food in small pieces and chew food thoroughly -Patient instructed to go to the ED if food/liquid becomes stuck in the esophagus and does not pass and if he is unable to swallow his saliva       CC:  Colon Branch, MD

## 2021-07-03 NOTE — Progress Notes (Signed)
____________________________________________________________  Attending physician addendum:  Thank you for sending this case to me. I have reviewed the entire note and agree with the plan.  I see that he preferred to start with a barium study, but I agree an upper endoscopy will most likely be warranted.  No recent antibiotic use to suggest pill esophagitis.  Wilfrid Lund, MD  ____________________________________________________________

## 2021-07-16 ENCOUNTER — Other Ambulatory Visit: Payer: Self-pay

## 2021-07-16 ENCOUNTER — Ambulatory Visit (HOSPITAL_COMMUNITY)
Admission: RE | Admit: 2021-07-16 | Discharge: 2021-07-16 | Disposition: A | Discharge status: Home / Self Care | Payer: Medicare HMO | Source: Ambulatory Visit | Attending: Nurse Practitioner | Admitting: Nurse Practitioner

## 2021-07-16 DIAGNOSIS — R1319 Other dysphagia: Secondary | ICD-10-CM | POA: Insufficient documentation

## 2021-07-16 DIAGNOSIS — K449 Diaphragmatic hernia without obstruction or gangrene: Secondary | ICD-10-CM | POA: Diagnosis not present

## 2021-07-16 DIAGNOSIS — K2100 Gastro-esophageal reflux disease with esophagitis, without bleeding: Secondary | ICD-10-CM | POA: Diagnosis not present

## 2021-07-16 DIAGNOSIS — K224 Dyskinesia of esophagus: Secondary | ICD-10-CM | POA: Diagnosis not present

## 2021-07-18 DIAGNOSIS — R32 Unspecified urinary incontinence: Secondary | ICD-10-CM | POA: Diagnosis not present

## 2021-07-18 DIAGNOSIS — N529 Male erectile dysfunction, unspecified: Secondary | ICD-10-CM | POA: Diagnosis not present

## 2021-07-18 DIAGNOSIS — Z008 Encounter for other general examination: Secondary | ICD-10-CM | POA: Diagnosis not present

## 2021-07-18 DIAGNOSIS — Z809 Family history of malignant neoplasm, unspecified: Secondary | ICD-10-CM | POA: Diagnosis not present

## 2021-07-18 DIAGNOSIS — Z91038 Other insect allergy status: Secondary | ICD-10-CM | POA: Diagnosis not present

## 2021-07-18 DIAGNOSIS — G8929 Other chronic pain: Secondary | ICD-10-CM | POA: Diagnosis not present

## 2021-07-18 DIAGNOSIS — M1990 Unspecified osteoarthritis, unspecified site: Secondary | ICD-10-CM | POA: Diagnosis not present

## 2021-07-18 DIAGNOSIS — I10 Essential (primary) hypertension: Secondary | ICD-10-CM | POA: Diagnosis not present

## 2021-07-18 DIAGNOSIS — Z87892 Personal history of anaphylaxis: Secondary | ICD-10-CM | POA: Diagnosis not present

## 2021-07-18 DIAGNOSIS — K219 Gastro-esophageal reflux disease without esophagitis: Secondary | ICD-10-CM | POA: Diagnosis not present

## 2021-07-25 ENCOUNTER — Encounter: Payer: Self-pay | Admitting: Gastroenterology

## 2021-07-25 ENCOUNTER — Ambulatory Visit: Payer: Medicare HMO | Admitting: Gastroenterology

## 2021-07-25 VITALS — BP 142/80 | HR 72 | Ht 66.5 in | Wt 163.1 lb

## 2021-07-25 DIAGNOSIS — R1319 Other dysphagia: Secondary | ICD-10-CM

## 2021-07-25 DIAGNOSIS — K449 Diaphragmatic hernia without obstruction or gangrene: Secondary | ICD-10-CM | POA: Diagnosis not present

## 2021-07-25 DIAGNOSIS — K224 Dyskinesia of esophagus: Secondary | ICD-10-CM | POA: Diagnosis not present

## 2021-07-25 NOTE — Progress Notes (Signed)
[facility] Progress Note:  History: Cory Zavala 07/25/2021  Referring provider: Colon Branch, MD  Reason for consult/chief complaint: Dysphagia (Still having difficulty swallowing, post DG esophagus)   Subjective  HPI: Seen 07/02/2021 by APP for chronic symptoms suspected to be reflux with heartburn mucus and throat clearing.  Patient was on once daily PPI, then developed rather abrupt onset of dysphagia and weight loss within 2 weeks prior to this recent visit.  He had been increased to twice daily PPI by her primary care provider.  Upper endoscopy offered, patient preferred to start with barium swallow with report as below.   Cory Zavala is glad to report that he is definitely improved from the last time we saw him.  He still has episodes of food feeling briefly stuck in the esophagus but then if he is cautious and drink some fluids it will pass.  He no longer has the feeling of a balloon inflating in the chest as had occurred prior to his visit with [place].  He has also quickly gained back the 10 or so pounds that he had lost. He is not getting much in the way of heartburn even on once daily PPI, has not noticed a change in that regard on twice daily dosing.  ROS:  Review of Systems He denies exertional chest pain, or dyspnea.  Past Medical History: Past Medical History:  Diagnosis Date   Back pain, chronic    BPH (benign prostatic hypertrophy)    Diverticulosis of colon    LEFT   ED (erectile dysfunction)    GERD (gastroesophageal reflux disease) 09/15/2012   Gross hematuria    2015   HTN (hypertension)    Nocturia    Normal cardiac stress test    PER CARDIOLOGIST NOTE, DR NISHAN, APPROX.  1994   Osteoporosis    RBBB      Past Surgical History: Past Surgical History:  Procedure Laterality Date   CATARACT EXTRACTION W/ INTRAOCULAR LENS  IMPLANT, BILATERAL  2010   CYSTOSCOPY WITH RETROGRADE PYELOGRAM, URETEROSCOPY AND STENT PLACEMENT Right 12/25/2013    Procedure: CYSTOSCOPY WITH RIGHT RETROGRADE PYELOGRAM, RIGHT URETEROSCOPY  BIOPSY AND STENT: BLADDER BIOPSY;  Surgeon: Claybon Jabs, MD;  Location: [place];  Service: Urology;  Laterality: Right;   VERTEBROPLASTY  2001     Family History: Family History  Problem Relation Age of Onset   CAD Neg Hx    Diabetes Neg Hx    Colon cancer Neg Hx    Prostate cancer Neg Hx    Allergic rhinitis Neg Hx    Angioedema Neg Hx    Asthma Neg Hx    Atopy Neg Hx    Eczema Neg Hx    Immunodeficiency Neg Hx    Urticaria Neg Hx    Esophageal cancer Neg Hx     Social History: Social History   Socioeconomic History   Marital status: Married    Spouse name: Not on file   Number of children: 2   Years of education: Not on file   Highest education level: Not on file  Occupational History   Occupation: Theme park manager, semi retired     Fish farm manager: RETIRED  Tobacco Use   Smoking status: Never   Smokeless tobacco: Never  Vaping Use   Vaping Use: Never used  Substance and Sexual Activity   Alcohol use: No   Drug use: No   Sexual activity: Not on file  Other Topics Concern   Not on  file  Social History Narrative   Retired, patient was a Company secretary still preachs, Educational psychologist   Married > 31 years, lost wife aprox 5-11. remarried         Social Determinants of Radio broadcast assistant Strain: Not on file  Food Insecurity: Not on file  Transportation Needs: Not on file  Physical Activity: Not on file  Stress: Not on file  Social Connections: Not on file    Allergies: Allergies  Allergen Reactions   Bee Venom Anaphylaxis    Outpatient Meds: Current Outpatient Medications  Medication Sig Dispense Refill   amLODipine (NORVASC) 5 MG tablet Take 1 tablet (5 mg total) by mouth daily. 90 tablet 1   Cholecalciferol (D3 ADULT PO) Take by mouth.     hydrocortisone (ANUSOL-HC) 25 MG suppository Place 1 suppository (25 mg total) rectally 2 (two) times daily as needed for  hemorrhoids. 12 suppository 1   loratadine (CLARITIN) 10 MG tablet Take 1-2 tablets 1-2 times per day 60 tablet 5   losartan (COZAAR) 50 MG tablet Take 1 tablet (50 mg total) by mouth daily. 90 tablet 1   Multiple Vitamin (MULTIVITAMIN) tablet Take 1 tablet by mouth daily.     Multiple Vitamins-Minerals (PRESERVISION AREDS) CAPS Take 1 capsule by mouth daily.     Nutritional Supplements (OSTEO ADVANCE PO) Take 4 tablets by mouth daily. OSTEO GOLD (BRAND)     OMEGA-3 FATTY ACIDS PO Take 1 tablet by mouth daily.     pantoprazole (PROTONIX) 40 MG tablet Take 1 tablet (40 mg total) by mouth 2 (two) times daily before a meal. 180 tablet 0   vitamin B-12 (CYANOCOBALAMIN) 500 MCG tablet Take 500 mcg by mouth daily.     EPINEPHrine 0.3 mg/0.3 mL IJ SOAJ injection Inject 0.3 mg into the muscle as needed for anaphylaxis. INJECT 0.3MLS(CONTENTS OF 1 SYRINGE) INTO THE MUSCLE AS DIRECTED (Patient not taking: Reported on 07/25/2021) 2 each 2   No current facility-administered medications for this visit.      ___________________________________________________________________ Objective   Exam:  BP (!) 142/80 (BP Location: Left Arm, Patient Position: Sitting, Cuff Size: Normal)    Pulse 72    Ht 5' 6.5" (1.689 m) Comment: height measured without shoes   Wt 163 lb 2 oz (74 kg)    BMI 25.93 kg/m  Wt Readings from Last 3 Encounters:  07/25/21 163 lb 2 oz (74 kg)  07/02/21 159 lb 6 oz (72.3 kg)  06/27/21 159 lb 2 oz (72.2 kg)   Note weight is back up  General: Well-appearing, normal vocal quality Appears in good health, gets on exam table slowly and independently.  Alert and conversational Eyes: sclera anicteric, no redness ENT: oral mucosa moist without lesions, no cervical or supraclavicular lymphadenopathy CV: RRR without murmur, S1/S2, no JVD, no peripheral edema Resp: clear to auscultation bilaterally, normal RR and effort noted GI: soft, no tenderness, with active bowel sounds. No guarding or  palpable organomegaly noted.   Radiologic Studies: CLINICAL DATA:  88 year old male with worsening of chronic GERD. No GI history. Request for double-contrast esophagus prior to EGD.   EXAM: ESOPHAGUS/BARIUM SWALLOW/TABLET STUDY   TECHNIQUE: Combined double and single contrast examination was performed using effervescent crystals, high-density barium, and thin liquid barium. This exam was performed by Aimee Han PA-C, and was supervised and interpreted by Dr. Leonia Reeves.   FLUOROSCOPY TIME:  Radiation Exposure Index (as provided by the fluoroscopic device): 11.0 MGy   If the device does not provide the  exposure index:   Fluoroscopy Time:  3 minutes 54 seconds   Number of Acquired Spot images:  0   COMPARISON:  NONE.   FINDINGS: Swallowing: Appears normal. No vestibular penetration or aspiration seen.   Pharynx: Unremarkable.   Esophagus: Distal esophagus appears torturous   Esophageal motility: Decreased.   Hiatal Hernia: Moderate to large hiatal hernia. While the GE junction is above the hemidiaphragms, a portion of the hernia appears paraesophageal. Probable fundus of the stomach.   Gastroesophageal reflux: None visualized.   Ingested 74mm barium tablet: Pass GE junction.   Other: None.   IMPRESSION: 1. Moderate size hiatal hernia. GE junction is above the hemidiaphragm however appears to be a portion of the hernia (potential gastric fundus) which is paraesophageal. 2. Mild esophageal dysmotility. 3. Barium tab passed GE junction.     Electronically Signed   By: Suzy Bouchard M.D.   On: 07/16/2021 10:29  (Barium study images personally reviewed -HD)  Assessment: Encounter Diagnoses  Name Primary?   Esophageal dysphagia Yes   Hiatal hernia    Esophageal dysmotility     I showed him diagrams of the anatomy and we discussed the findings on his barium study as well. He has a hiatal hernia with both sliding and probable paraesophageal component.  It  is difficult to say why his dysphagia came on rather abruptly, though I wonder if he may have had further herniation of the proximal stomach and then triggered a dysmotility.  He clearly has a presbyesophagus picture on this barium study as well.  Herniation is caused distal esophageal tortuosity as well.  Good passage of the barium tablet and excellent passage of liquid indicates there is not likely a significant fixed stricture amenable to endoscopic dilation.  He is improved from his visit with [place] several weeks ago, he has eaten more slowly, says he can still have meat and pretty much what ever else he likes as long as he cuts and chews it well, I reminded him to have liquids after every couple of bites and to stay upright for at least 30 minutes after meals.  He can also decrease the PPI to once a day.  At this point, I suspect risks of an upper endoscopy likely outweigh expected benefits given my assessment above. If he develops worsening of symptoms, then he will contact us and upper endoscopy will be scheduled.   32 minutes were spent on this encounter (including chart review, history/exam, counseling/coordination of care, and documentation) > 50% of that time was spent on counseling and coordination of care.   Nelida Meuse III  CC: Referring provider noted above

## 2021-07-25 NOTE — Patient Instructions (Signed)
If you are age 86 or older, your body mass index should be between 23-30. Your Body mass index is 25.93 kg/m. If this is out of the aforementioned range listed, please consider follow up with your Primary Care Provider.  If you are age 32 or younger, your body mass index should be between 19-25. Your Body mass index is 25.93 kg/m. If this is out of the aformentioned range listed, please consider follow up with your Primary Care Provider.   ________________________________________________________  The [facility] providers would like to encourage you to use [facility] to communicate with providers for non-urgent requests or questions.  Due to long hold times on the telephone, sending your provider a message by [facility] may be a faster and more efficient way to get a response.  Please allow 48 business hours for a response.  Please remember that this is for non-urgent requests.  _______________________________________________________  It was a pleasure to see you today!  Thank you for trusting me with your gastrointestinal care!

## 2021-10-14 ENCOUNTER — Encounter: Payer: Self-pay | Admitting: Internal Medicine

## 2021-10-14 ENCOUNTER — Ambulatory Visit: Payer: Medicare HMO

## 2021-10-14 ENCOUNTER — Ambulatory Visit (INDEPENDENT_AMBULATORY_CARE_PROVIDER_SITE_OTHER): Payer: Medicare HMO | Admitting: Internal Medicine

## 2021-10-14 VITALS — BP 132/76 | HR 72 | Temp 97.7°F | Resp 16 | Ht 66.5 in | Wt 165.2 lb

## 2021-10-14 DIAGNOSIS — I10 Essential (primary) hypertension: Secondary | ICD-10-CM

## 2021-10-14 DIAGNOSIS — K5900 Constipation, unspecified: Secondary | ICD-10-CM

## 2021-10-14 DIAGNOSIS — M170 Bilateral primary osteoarthritis of knee: Secondary | ICD-10-CM

## 2021-10-14 DIAGNOSIS — R1310 Dysphagia, unspecified: Secondary | ICD-10-CM | POA: Diagnosis not present

## 2021-10-14 LAB — BASIC METABOLIC PANEL
BUN: 21 mg/dL (ref 6–23)
CO2: 29 mEq/L (ref 19–32)
Calcium: 9.2 mg/dL (ref 8.4–10.5)
Chloride: 104 mEq/L (ref 96–112)
Creatinine, Ser: 1.28 mg/dL (ref 0.40–1.50)
GFR: 49.68 mL/min — ABNORMAL LOW (ref >60.00)
Glucose, Bld: 106 mg/dL — ABNORMAL HIGH (ref 70–99)
Potassium: 4.2 mEq/L (ref 3.5–5.1)
Sodium: 141 mEq/L (ref 135–145)

## 2021-10-14 MED ORDER — MELOXICAM 7.5 MG PO TABS: 7.5000 mg | ORAL_TABLET | Freq: Every day | ORAL | 0 refills | Status: DC

## 2021-10-14 NOTE — Progress Notes (Signed)
? ?Subjective:  ? ? Patient ID: Cory Zavala, male    DOB: Oct 11, 1930, 88 y.o.   MRN: [id number] ? ?DOS:  10/14/2021 ?Type of visit - description: Acute ? ?Has a history of right knee pain, saw Ortho last year, it is better but he still requires meloxicam from time to time. ?Denies side effects. ? ?Constipation lately, going to the bathroom every other day, the days that he does not have a BM feels slightly full. ? ?HTN: Good med compliance ? ?Saw GI, note reviewed ? ? ? ?Review of Systems ?See above  ? ?Past Medical History:  ?Diagnosis Date  ? Back pain, chronic   ? BPH (benign prostatic hypertrophy)   ? Diverticulosis of colon   ? LEFT  ? ED (erectile dysfunction)   ? GERD (gastroesophageal reflux disease) 09/15/2012  ? Gross hematuria   ? 2015  ? HTN (hypertension)   ? Nocturia   ? Normal cardiac stress test   ? PER CARDIOLOGIST NOTE, DR NISHAN, APPROX.  1994  ? Osteoporosis   ? RBBB   ? ? ?Past Surgical History:  ?Procedure Laterality Date  ? CATARACT EXTRACTION W/ INTRAOCULAR LENS  IMPLANT, BILATERAL  2010  ? CYSTOSCOPY WITH RETROGRADE PYELOGRAM, URETEROSCOPY AND STENT PLACEMENT Right 12/25/2013  ? Procedure: CYSTOSCOPY WITH RIGHT RETROGRADE PYELOGRAM, RIGHT URETEROSCOPY  BIOPSY AND STENT: BLADDER BIOPSY;  Surgeon: Claybon Jabs, MD;  Location: [place];  Service: Urology;  Laterality: Right;  ? VERTEBROPLASTY  2001  ? ? ?Current Outpatient Medications  ?Medication Instructions  ? amLODipine (NORVASC) 5 mg, Oral, Daily  ? Cholecalciferol (D3 ADULT PO) Oral  ? EPINEPHrine (EPI-PEN) 0.3 mg, Intramuscular, As needed, INJECT 0.3MLS(CONTENTS OF 1 SYRINGE) INTO THE MUSCLE AS DIRECTED  ? hydrocortisone (ANUSOL-HC) 25 mg, Rectal, 2 times daily PRN  ? loratadine (CLARITIN) 10 MG tablet Take 1-2 tablets 1-2 times per day  ? losartan (COZAAR) 50 mg, Oral, Daily  ? meloxicam (MOBIC) 15 mg, Oral, Daily  ? Multiple Vitamin (MULTIVITAMIN) tablet 1 tablet, Daily  ? Multiple Vitamins-Minerals (PRESERVISION AREDS)  CAPS 1 capsule, Oral, Daily  ? Nutritional Supplements (OSTEO ADVANCE PO) 4 tablets, Oral, Daily, OSTEO GOLD (BRAND)   ? OMEGA-3 FATTY ACIDS PO 1 tablet, Oral, Daily  ? pantoprazole (PROTONIX) 40 mg, Oral, 2 times daily before meals  ? vitamin B-12 (CYANOCOBALAMIN) 500 mcg, Daily  ? ? ?   ?Objective:  ? Physical Exam ?BP 132/76 (BP Location: Left Arm, Patient Position: Sitting, Cuff Size: Small)   Pulse 72   Temp 97.7 ?F (36.5 ?C) (Oral)   Resp 16   Ht 5' 6.5" (1.689 m)   Wt 165 lb 4 oz (75 kg)   SpO2 97%   BMI 26.27 kg/m?  ?General:   ?Well developed, NAD, BMI noted.  ?HEENT:  ?Normocephalic . Face symmetric, atraumatic ?Abdomen:  ?Not distended, soft, non-tender. No rebound or rigidity.   ?Skin: Not pale. Not jaundice ?Lower extremities: no pretibial edema bilaterally.  Knees: + Bony deformities consistent with DJD, no effusion.  + Bowing. ?Neurologic:  ?alert & oriented X3.  ?Speech normal, gait appropriate for age and unassisted ?Psych--  ?Cognition and judgment appear intact.  ?Cooperative with normal attention span and concentration.  ?Behavior appropriate. ?No anxious or depressed appearing. ? ?   ?Assessment   ? ?Assessment   ?Hyperglycemia ?HTN ?GERD ?RBBB ?MSK: ?--Chronic back pain ?-- Vertebroplasty 2001 ?-- Osteoporosis T score -3.3 (2014), declined all meds 2014 and  2019 ?GU: ?---BPH ?---ED ?---  Gross hematuria 2015  ?Initial CT show a question of urothelial cancer, subsequently a ureteroscopy was done and reportedly normal, subsequently a biopsy was negative. ?Follow-up MRI 01-2014 show improvement. ?Saw urology ~ 04-2015 ?Exercise stress test 2014 negative ?Bee sting allergies: has a Epi pen ?  ?PLAN: ?DJD: Complaining of right knee pain, saw Ortho last year, got a local injection, since then is better but he still requires meloxicam 15 mg occasionally. ?Explained GI side effects and potential for kidney damage. ?Plan: Use primarily Tylenol, knee brace, he likes to use BLU-EMU which is okay.  I  recommended to take meloxicam at the reduced to also 7.5 mg infrequently, once or twice a week if needed.  He verbalized understanding.  Prescription sent. ?Dysphagia: Saw GI 07/25/2021, barium study showed moderate size hiatal hernia, GE junction above the diaphragma, paraesophageal component?.  Advise regards swallowing precautions were discussed, PPI dose reduced, they will consider EGD if needed.  At this point symptoms are resolved ?Constipation: As described above, mild, recommend MiraLAX every other day.  Call if not better ?HTN: Continue amlodipine, losartan, next visit is in December thus we will check a BMP today. ?RTC 05/2022 physical ? ?  ?

## 2021-10-14 NOTE — Patient Instructions (Addendum)
Please consider the following vaccines at your pharmacy:  ?Tdap (tetanus) ?Covid booster (bivalent) ? ? ?For knee pain: ?Use knee brace as frequent as possible ?Tylenol  500 mg OTC 2 tabs a day every 8 hours as needed for pain ?Ice as needed ?Blue EMU cream at nighttime ?If you continue with pain, okay to take meloxicam once or twice a week only.  Always with food.  Watch for stomach problems. ? ?For constipation:  ?MiraLAX over-the-counter: 17 g of powder mixed with water every other day as needed. ? ? ?GO TO THE LAB : Get the blood work   ? ? ?[place], [place] ?Come back for a physical exam by December 2023 ?

## 2021-10-14 NOTE — Assessment & Plan Note (Signed)
?  DJD: Complaining of right knee pain, saw Ortho last year, got a local injection, since then is better but he still requires meloxicam 15 mg occasionally. ?Explained GI side effects and potential for kidney damage. ?Plan: Use primarily Tylenol, knee brace, he likes to use BLU-EMU which is okay.  I recommended to take meloxicam at the reduced to also 7.5 mg infrequently, once or twice a week if needed.  He verbalized understanding.  Prescription sent. ?Dysphagia: Saw GI 07/25/2021, barium study showed moderate size hiatal hernia, GE junction above the diaphragma, paraesophageal component?.  Advise regards swallowing precautions were discussed, PPI dose reduced, they will consider EGD if needed.  At this point symptoms are resolved ?Constipation: As described above, mild, recommend MiraLAX every other day.  Call if not better ?HTN: Continue amlodipine, losartan, next visit is in December thus we will check a BMP today. ?RTC 05/2022 physical ? ?  ?

## 2021-11-05 ENCOUNTER — Other Ambulatory Visit: Payer: Self-pay | Admitting: Internal Medicine

## 2021-11-11 ENCOUNTER — Other Ambulatory Visit: Payer: Self-pay | Admitting: Internal Medicine

## 2021-11-19 DIAGNOSIS — M1711 Unilateral primary osteoarthritis, right knee: Secondary | ICD-10-CM | POA: Diagnosis not present

## 2021-12-04 NOTE — Telephone Encounter (Signed)
[facility] appt scheduled.

## 2021-12-08 ENCOUNTER — Ambulatory Visit (INDEPENDENT_AMBULATORY_CARE_PROVIDER_SITE_OTHER): Payer: Medicare HMO

## 2021-12-08 VITALS — BP 118/78 | HR 60 | Temp 98.3°F | Resp 16 | Ht 68.0 in | Wt 164.2 lb

## 2021-12-08 DIAGNOSIS — Z0000 Encounter for general adult medical examination without abnormal findings: Secondary | ICD-10-CM | POA: Diagnosis not present

## 2021-12-18 DIAGNOSIS — Z961 Presence of intraocular lens: Secondary | ICD-10-CM | POA: Diagnosis not present

## 2021-12-18 DIAGNOSIS — H43812 Vitreous degeneration, left eye: Secondary | ICD-10-CM | POA: Diagnosis not present

## 2021-12-18 DIAGNOSIS — H5213 Myopia, bilateral: Secondary | ICD-10-CM | POA: Diagnosis not present

## 2021-12-18 DIAGNOSIS — H353131 Nonexudative age-related macular degeneration, bilateral, early dry stage: Secondary | ICD-10-CM | POA: Diagnosis not present

## 2022-01-15 ENCOUNTER — Other Ambulatory Visit: Payer: Self-pay | Admitting: Internal Medicine

## 2022-01-23 DIAGNOSIS — Z0100 Encounter for examination of eyes and vision without abnormal findings: Secondary | ICD-10-CM | POA: Diagnosis not present

## 2022-02-17 ENCOUNTER — Ambulatory Visit: Payer: Medicare HMO | Admitting: Internal Medicine

## 2022-02-19 ENCOUNTER — Other Ambulatory Visit: Payer: Self-pay

## 2022-02-19 ENCOUNTER — Emergency Department (HOSPITAL_BASED_OUTPATIENT_CLINIC_OR_DEPARTMENT_OTHER): Payer: Medicare HMO

## 2022-02-19 ENCOUNTER — Observation Stay (HOSPITAL_BASED_OUTPATIENT_CLINIC_OR_DEPARTMENT_OTHER)
Admission: EM | Admit: 2022-02-19 | Discharge: 2022-02-20 | Disposition: A | Discharge status: Home / Self Care | Payer: Medicare HMO | Attending: Internal Medicine | Admitting: Internal Medicine

## 2022-02-19 ENCOUNTER — Encounter (HOSPITAL_COMMUNITY): Payer: Self-pay

## 2022-02-19 ENCOUNTER — Encounter (HOSPITAL_BASED_OUTPATIENT_CLINIC_OR_DEPARTMENT_OTHER): Payer: Self-pay | Admitting: *Deleted

## 2022-02-19 DIAGNOSIS — H532 Diplopia: Secondary | ICD-10-CM | POA: Diagnosis not present

## 2022-02-19 DIAGNOSIS — R42 Dizziness and giddiness: Secondary | ICD-10-CM | POA: Diagnosis not present

## 2022-02-19 DIAGNOSIS — K2100 Gastro-esophageal reflux disease with esophagitis, without bleeding: Secondary | ICD-10-CM

## 2022-02-19 DIAGNOSIS — K219 Gastro-esophageal reflux disease without esophagitis: Secondary | ICD-10-CM | POA: Diagnosis not present

## 2022-02-19 DIAGNOSIS — I639 Cerebral infarction, unspecified: Principal | ICD-10-CM

## 2022-02-19 DIAGNOSIS — I6523 Occlusion and stenosis of bilateral carotid arteries: Secondary | ICD-10-CM | POA: Diagnosis not present

## 2022-02-19 DIAGNOSIS — Z79899 Other long term (current) drug therapy: Secondary | ICD-10-CM | POA: Diagnosis not present

## 2022-02-19 DIAGNOSIS — I10 Essential (primary) hypertension: Secondary | ICD-10-CM | POA: Diagnosis not present

## 2022-02-19 DIAGNOSIS — I6503 Occlusion and stenosis of bilateral vertebral arteries: Secondary | ICD-10-CM | POA: Diagnosis not present

## 2022-02-19 DIAGNOSIS — E876 Hypokalemia: Secondary | ICD-10-CM | POA: Insufficient documentation

## 2022-02-19 DIAGNOSIS — I63541 Cerebral infarction due to unspecified occlusion or stenosis of right cerebellar artery: Secondary | ICD-10-CM | POA: Diagnosis not present

## 2022-02-19 LAB — BASIC METABOLIC PANEL
Anion gap: 12 (ref 5–15)
BUN: 22 mg/dL (ref 8–23)
CO2: 25 mmol/L (ref 22–32)
Calcium: 9.5 mg/dL (ref 8.9–10.3)
Chloride: 104 mmol/L (ref 98–111)
Creatinine, Ser: 1.02 mg/dL (ref 0.61–1.24)
GFR, Estimated: >60 mL/min (ref >60)
Glucose, Bld: 142 mg/dL — ABNORMAL HIGH (ref 70–99)
Potassium: 3.9 mmol/L (ref 3.5–5.1)
Sodium: 141 mmol/L (ref 135–145)

## 2022-02-19 LAB — CBC
HCT: 42.7 % (ref 39.0–52.0)
Hemoglobin: 14.8 g/dL (ref 13.0–17.0)
MCH: 32.5 pg (ref 26.0–34.0)
MCHC: 34.7 g/dL (ref 30.0–36.0)
MCV: 93.8 fL (ref 80.0–100.0)
Platelets: 193 10*3/uL (ref 150–400)
RBC: 4.55 MIL/uL (ref 4.22–5.81)
RDW: 12.1 % (ref 11.5–15.5)
WBC: 8.2 10*3/uL (ref 4.0–10.5)
nRBC: 0 % (ref 0.0–0.2)

## 2022-02-19 LAB — TROPONIN I (HIGH SENSITIVITY)
Troponin I (High Sensitivity): 4 ng/L (ref <18)
Troponin I (High Sensitivity): 4 ng/L (ref <18)

## 2022-02-19 LAB — URINALYSIS, ROUTINE W REFLEX MICROSCOPIC
Bilirubin Urine: NEGATIVE
Glucose, UA: NEGATIVE mg/dL
Hgb urine dipstick: NEGATIVE
Ketones, ur: 15 mg/dL — AB
Leukocytes,Ua: NEGATIVE
Nitrite: NEGATIVE
Protein, ur: NEGATIVE mg/dL
Specific Gravity, Urine: 1.014 (ref 1.005–1.030)
pH: 7 (ref 5.0–8.0)

## 2022-02-19 MED ORDER — ACETAMINOPHEN 650 MG RE SUPP: 650.0000 mg | Freq: Four times a day (QID) | RECTAL | Status: DC | PRN

## 2022-02-19 MED ORDER — ACETAMINOPHEN 325 MG PO TABS
650.0000 mg | ORAL_TABLET | Freq: Four times a day (QID) | ORAL | Status: DC | PRN
  Administered 2022-02-20: 650 mg via ORAL
  Filled 2022-02-19: qty 2

## 2022-02-19 MED ORDER — IOHEXOL 350 MG/ML SOLN
75.0000 mL | Freq: Once | INTRAVENOUS | Status: AC | PRN
  Administered 2022-02-19: 75 mL via INTRAVENOUS

## 2022-02-19 MED ORDER — STROKE: EARLY STAGES OF RECOVERY BOOK
Freq: Once | Status: AC
  Filled 2022-02-19: qty 1

## 2022-02-19 MED ORDER — ASPIRIN 81 MG PO CHEW
324.0000 mg | CHEWABLE_TABLET | ORAL | Status: AC
  Administered 2022-02-19: 324 mg via ORAL
  Filled 2022-02-19: qty 4

## 2022-02-19 NOTE — ED Notes (Signed)
Report given to [facility]. 

## 2022-02-19 NOTE — Plan of Care (Signed)

## 2022-02-19 NOTE — ED Notes (Signed)
Report given to the floor. 

## 2022-02-19 NOTE — ED Notes (Signed)
Second troponin collected and dropped off at lab. Connected him to the monitor. Patient is resting with his son by his side

## 2022-02-19 NOTE — ED Notes (Addendum)
Pt CAOx4. no slurred speech and Smile normal - according to family. no feeling difference between left/right. able to move left/ arm/legs and hold them up for the 5 or 10 secs and no drift. Was able to see in all four quads for both eyes. No blood thinners. No recent falls. Only thing I could find was the dizziness since last night and left eye not able to move all the way across towards the nose.

## 2022-02-19 NOTE — ED Provider Notes (Signed)
[facility] EMERGENCY DEPT Provider Note   CSN: [id number] Arrival date & time: 02/19/22  1433     History  Chief Complaint  Patient presents with   Dizziness    Cory Zavala is a 88 y.o. male.  88 year old male with a history of hypertension who presents to the emergency department with dizziness and double vision.  Patient states that last night at 10:30 PM.  Said that it started while he was walking and had to hold onto objects to get back to bed.  Says that he laid down but felt persistently dizzy and was unable to walk without assistance for the remainder of the night.  Saw son today who decided regimen for evaluation.  Says that he is also had double vision but denies any difficulty speaking or swallowing.  Not on blood thinners.  No history of stroke.  No head trauma or headache or neck pain.   Dizziness      Home Medications Prior to Admission medications   Medication Sig Start Date End Date Taking? Authorizing Provider  amLODipine (NORVASC) 5 MG tablet Take 1 tablet (5 mg total) by mouth daily. 04/07/21  Yes Paz, Alda Berthold, MD  Cholecalciferol (D3 ADULT PO) Take 1 tablet by mouth daily at 6 (six) AM.   Yes [provider]  EPINEPHrine 0.3 mg/0.3 mL IJ SOAJ injection Inject 0.3 mg into the muscle as needed for anaphylaxis. INJECT 0.3MLS(CONTENTS OF 1 SYRINGE) INTO THE MUSCLE AS DIRECTED Patient taking differently: Inject 0.3 mg into the muscle daily as needed for anaphylaxis. 12/27/20  Yes Paz, Alda Berthold, MD  loratadine (CLARITIN) 10 MG tablet Take 1-2 tablets 1-2 times per day Patient taking differently: Take 10 mg by mouth daily as needed for allergies. 10/17/19  Yes Kozlow, Donnamarie Poag, MD  losartan (COZAAR) 50 MG tablet TAKE 1 TABLET BY MOUTH EVERY DAY Patient taking differently: Take 50 mg by mouth daily. 11/05/21  Yes Paz, Alda Berthold, MD  meloxicam (MOBIC) 15 MG tablet Take 15 mg by mouth daily. 02/11/22  Yes [provider]  Multiple Vitamin  (MULTIVITAMIN) tablet Take 1 tablet by mouth daily.   Yes [provider]  Multiple Vitamins-Minerals (PRESERVISION AREDS) CAPS Take 1 capsule by mouth daily.   Yes [provider]  Nutritional Supplements (OSTEO ADVANCE PO) Take 4 tablets by mouth daily. OSTEO GOLD (BRAND)   Yes [provider]  OMEGA-3 FATTY ACIDS PO Take 1 tablet by mouth daily.   Yes [provider]  pantoprazole (PROTONIX) 40 MG tablet TAKE 1 TABLET (40 MG TOTAL) BY MOUTH TWICE A DAY BEFORE MEALS Patient taking differently: Take 40 mg by mouth daily. 01/15/22  Yes Paz, Alda Berthold, MD  vitamin B-12 (CYANOCOBALAMIN) 500 MCG tablet Take 500 mcg by mouth daily.   Yes [provider]  hydrocortisone (ANUSOL-HC) 25 MG suppository Place 1 suppository (25 mg total) rectally 2 (two) times daily as needed for hemorrhoids. Patient not taking: Reported on 02/19/2022 03/25/20   Colon Branch, MD  meloxicam ([place]) 7.5 MG tablet TAKE 1 TABLET BY MOUTH EVERY DAY Patient not taking: Reported on 02/19/2022 11/11/21   Colon Branch, MD      Allergies    Bee venom    Review of Systems   Review of Systems  Neurological:  Positive for dizziness.    Physical Exam Updated Vital Signs BP (!) 149/87 (BP Location: Left Arm)   Pulse 65   Temp 98.4 F (36.9 C) (Oral)  Resp 16   Ht '5\' 9"'$  (1.753 m)   Wt 71.4 kg   SpO2 96%   BMI 23.25 kg/m  Physical Exam Vitals and nursing note reviewed.  Constitutional:      General: He is not in acute distress.    Appearance: He is well-developed.  HENT:     Head: Normocephalic and atraumatic.     Right Ear: External ear normal.     Left Ear: External ear normal.     Nose: Nose normal.  Eyes:     Extraocular Movements: Extraocular movements intact.     Conjunctiva/sclera: Conjunctivae normal.     Pupils: Pupils are equal, round, and reactive to light.  Cardiovascular:     Rate and Rhythm: Normal rate and regular rhythm.     Heart sounds: Normal heart sounds.   Pulmonary:     Effort: Pulmonary effort is normal. No respiratory distress.     Breath sounds: Normal breath sounds.  Abdominal:     General: There is no distension.     Palpations: Abdomen is soft. There is no mass.     Tenderness: There is no abdominal tenderness. There is no guarding.  Musculoskeletal:        General: No swelling.     Cervical back: Normal range of motion and neck supple.     Right lower leg: No edema.     Left lower leg: No edema.  Skin:    General: Skin is warm and dry.     Capillary Refill: Capillary refill takes less than 2 seconds.  Neurological:     Mental Status: He is alert.     Comments: MENTAL STATUS: AAOx3 CRANIAL NERVES: II: Pupils equal and reactive 3 mm BL, no RAPD, no VF deficits III, IV, VI: INO noted with R gaze V: normal sensation to light touch in V1, V2, and V3 segments bilaterally VII: no facial weakness or asymmetry, no nasolabial fold flattening VIII: normal hearing to speech and finger friction IX, X: normal palatal elevation, no uvular deviation XI: 5/5 head turn and 5/5 shoulder shrug bilaterally XII: midline tongue protrusion MOTOR: 5/5 strength in R shoulder flexion, elbow flexion and extension, and grip strength. 5/5 strength in L shoulder flexion, elbow flexion and extension, and grip strength.  5/5 strength in R hip and knee flexion, knee extension, ankle plantar and dorsiflexion. 5/5 strength in L hip and knee flexion, knee extension, ankle plantar and dorsiflexion. SENSORY: Normal sensation to light touch in all extremities COORD: Normal finger to nose and heel to shin, no tremor, no dysmetria STATION: normal stance, no truncal ataxia GAIT: shuffling gait   Psychiatric:        Mood and Affect: Mood normal.        Behavior: Behavior normal.     ED Results / Procedures / Treatments   Labs (all labs ordered are listed, but only abnormal results are displayed) Labs Reviewed  BASIC METABOLIC PANEL - Abnormal; Notable for  the following components:      Result Value   Glucose, Bld 142 (*)    All other components within normal limits  URINALYSIS, ROUTINE W REFLEX MICROSCOPIC - Abnormal; Notable for the following components:   Ketones, ur 15 (*)    All other components within normal limits  COMPREHENSIVE METABOLIC PANEL - Abnormal; Notable for the following components:   Potassium 3.4 (*)    Glucose, Bld 108 (*)    Total Protein 5.8 (*)    All other components within  normal limits  HEMOGLOBIN A1C - Abnormal; Notable for the following components:   Hgb A1c MFr Bld 5.9 (*)    All other components within normal limits  CBC  LIPID PANEL  CBC WITH DIFFERENTIAL/PLATELET  MAGNESIUM  CBG MONITORING, ED  TROPONIN I (HIGH SENSITIVITY)  TROPONIN I (HIGH SENSITIVITY)    EKG EKG Interpretation  Date/Time:  Thursday February 19 2022 14:47:16 EDT Ventricular Rate:  81 PR Interval:  162 QRS Duration: 148 QT Interval:  494 QTC Calculation: 573 R Axis:   0 Text Interpretation: Normal sinus rhythm Right bundle branch block Septal infarct , age undetermined Abnormal ECG When compared with ECG of 28-Aug-2020 13:16, QT has lengthened - Measured QTc is 451m Confirmed by PMargaretmary Eddy([phone] on 02/19/2022 2:57:34 PM  Radiology CT ANGIO HEAD NECK W WO CM  Result Date: 02/19/2022 CLINICAL DATA:  Right PICA territory infarct. EXAM: CT ANGIOGRAPHY HEAD AND NECK TECHNIQUE: Multidetector CT imaging of the head and neck was performed using the standard protocol during bolus administration of intravenous contrast. Multiplanar CT image reconstructions and MIPs were obtained to evaluate the vascular anatomy. Carotid stenosis measurements (when applicable) are obtained utilizing NASCET criteria, using the distal internal carotid diameter as the denominator. RADIATION DOSE REDUCTION: This exam was performed according to the departmental dose-optimization program which includes automated exposure control, adjustment of the mA  and/or kV according to patient size and/or use of iterative reconstruction technique. CONTRAST:  748mOMNIPAQUE IOHEXOL 350 MG/ML SOLN COMPARISON:  MRI of the brain February 19, 2022. FINDINGS: CT HEAD FINDINGS Brain: Hypodensity within the right inferior cerebellar hemisphere corresponding to infarct described on recent MRI. No new acute infarct identified. Small remote infarct in the left thalamus. No acute hemorrhage, hydrocephalus, extra-axial collection or mass lesion. Vascular: No hyperdense vessel or unexpected calcification. Skull: Normal. Negative for fracture or focal lesion. Sinuses/Orbits: No acute finding. Other: None. Review of the MIP images confirms the above findings CTA NECK FINDINGS Aortic arch: Standard branching. Imaged portion shows no evidence of aneurysm or dissection. Calcified plaques are noted in the aortic arch. No significant stenosis of the major arch vessel origins. Right carotid system: Calcified atherosclerotic plaque is seen in the right carotid bifurcation without hemodynamically significant stenosis. Increased tortuosity of the cervical segment of right ICA. Left carotid system: Calcified atherosclerotic plaque is seen in the left carotid bifurcation without hemodynamically significant stenosis. Increased tortuosity of the cervical. Vertebral arteries: Calcified atherosclerotic plaque is noted at the origin of the non dominant right vertebral artery resulting in mild stenosis. Calcified atherosclerotic plaque is seen at the origin of the dominant left vertebral artery without hemodynamically significant stenosis. Increased tortuosity of the V2 segment of the left vertebral artery is noted without hemodynamically significant stenosis. Skeleton: Degenerative changes of the cervical spine. No acute or aggressive process identified. Other neck: Subcentimeter left thyroid lobe nodule for which no follow-up is recommended. Upper chest: Mild pleuropulmonary apical scarring. Review of the  MIP images confirms the above findings CTA HEAD FINDINGS Anterior circulation: Mild atherosclerotic plaques in the bilateral carotid siphons without hemodynamically significant stenosis. No significant stenosis, proximal occlusion, aneurysm or vascular malformation. Posterior circulation: Non dominant intracranial right vertebral artery supplying primarily the right PICA which remains patent. The right vertebral artery is severely hypoplastic distal to the right PICA origin. The lung dominant left vertebral artery and left PICA are patent. Hypoplastic vertebrobasilar system related to presence of bilateral fetal PCA. Venous sinuses: As permitted by contrast timing, patent. Anatomic variants: Bilateral fetal  PCAs. Review of the MIP images confirms the above findings IMPRESSION: 1. Small hypodense area within the right inferior cerebellar hemisphere corresponding to the right PICA territory infarct described on recent MRI of the brain. No new infarct identified. 2. No intracranial emergency large vessel occlusion identified. 3. Atherosclerotic changes at the origin of a non dominant right vertebral artery resulting in mild stenosis. 4. Mild atherosclerotic changes of the bilateral carotid bifurcation without hemodynamically significant stenosis. Electronically Signed   By: Pedro Earls M.D.   On: 02/19/2022 17:47   MR BRAIN WO CONTRAST  Result Date: 02/19/2022 CLINICAL DATA:  Dizziness EXAM: MRI HEAD WITHOUT CONTRAST TECHNIQUE: Multiplanar, multiecho pulse sequences of the brain and surrounding structures were obtained without intravenous contrast. COMPARISON:  None Available. FINDINGS: Brain: There is diffusion restriction with associated FLAIR signal abnormality in the right cerebellar hemisphere in the PICA distribution consistent with acute infarct. There is no hemorrhage or mass effect. There is no other evidence of acute infarct. There is no acute intracranial hemorrhage or extra-axial fluid  collection. There is moderate global parenchymal volume loss with prominence of the ventricular system and extra-axial CSF spaces. There is a mild burden of chronic small vessel ischemic change in the supratentorial white matter. There is no mass lesion.  There is no mass effect or midline shift. Vascular: Normal flow voids. Skull and upper cervical spine: Normal marrow signal. Sinuses/Orbits: The paranasal sinuses are clear. Bilateral lens implants are in place. The globes and orbits are otherwise unremarkable. Other: None. IMPRESSION: Acute infarct in the right cerebellar hemisphere in the PICA distribution without hemorrhage or mass effect. Electronically Signed   By: Valetta Mole M.D.   On: 02/19/2022 16:23    Procedures Procedures   Medications Ordered in ED Medications  acetaminophen (TYLENOL) tablet 650 mg (650 mg Oral Given 02/20/22 0005)    Or  acetaminophen (TYLENOL) suppository 650 mg ( Rectal See Alternative 02/20/22 0005)  labetalol (NORMODYNE) injection 10 mg (has no administration in time range)  pantoprazole (PROTONIX) EC tablet 40 mg (40 mg Oral Given 02/20/22 0902)  aspirin EC tablet 81 mg (81 mg Oral Given 02/20/22 0902)  clopidogrel (PLAVIX) tablet 75 mg (75 mg Oral Given 02/20/22 0902)  rosuvastatin (CRESTOR) tablet 10 mg (10 mg Oral Given 02/20/22 0902)  aspirin chewable tablet 324 mg (324 mg Oral Given 02/19/22 1810)  iohexol (OMNIPAQUE) 350 MG/ML injection 75 mL (75 mLs Intravenous Contrast Given 02/19/22 1702)   stroke: early stages of recovery book ( Does not apply Given 02/20/22 0009)  potassium chloride SA (KLOR-CON M) CR tablet 40 mEq (40 mEq Oral Given 02/20/22 0902)    ED Course/ Medical Decision Making/ A&P Clinical Course as of 02/20/22 1143  Thu Feb 19, 2022  1635 Paging neuro again. [RP]  1640 Dr Rory Percy has reviewed imaging.  Request that CTA and CT of the head be obtained at this time.  If no acute intervention required requests admission to medicine.  Would like for 324 of  aspirin to be given now.  [RP]  8546 Spoke with Dr. Posey Pronto from hospitalist who will admit the patient to [facility] for further management. [RP]    Clinical Course User Index [RP] Fransico Meadow, MD                           Medical Decision Making Amount and/or Complexity of Data Reviewed Labs: ordered. Radiology: ordered.  Risk OTC drugs. Prescription drug  management. Decision regarding hospitalization.   Cory Zavala is a 88 y.o. male with history of hypertension who presents with chief complaint of dizziness, mild ataxia, and INO with right-sided gaze.  Initial Ddx:  Stroke, ICH, increased ICP, brain tumor, Warnicke's  MDM:  Feel the patient is likely having a stroke given his symptoms and the acute onset.  ICH also possible.  Increased ICP and brain tumor also on the differential but feel it is less likely given the acute onset of his symptoms.  Did consider Warnicke's encephalopathy but patient appears to be well-nourished and does not have any history of alcohol abuse.  Plan:  Labs CT head MRI brain Neurology consult  ED Summary:  Patient underwent the above work-up which did show cerebellar stroke in PICA distribution.  CT head did not show acute bleed and he also underwent CTA for vessel imaging.  He was given 324 of aspirin and was admitted to hospitalist for further management of his stroke and evaluation into the etiology.  Did consult neurology prior to admission.  Dispo: Admit to Floor   Additional history obtained from son Records reviewed Care Everywhere The following labs were independently interpreted: Chemistry I independently visualized the following imaging with scope of interpretation limited to determining acute life threatening conditions related to emergency care: CT Head, which revealed no bleeding or mass   Final Clinical Impression(s) / ED Diagnoses Final diagnoses:  Cerebrovascular accident (CVA), unspecified mechanism ([place])  Dizziness  and giddiness  Double vision    Rx / DC Orders ED Discharge Orders     None         Fransico Meadow, MD 02/20/22 1143

## 2022-02-19 NOTE — ED Notes (Signed)
[place] screen pass

## 2022-02-19 NOTE — Consult Note (Signed)
NEURO HOSPITALIST CONSULT NOTE   Requesting physician: Dr. Velia Meyer  Reason for Consult: Right cerebellar subacute ischemic infarction  History obtained from:  Patient and Chart     HPI:                                                                                                                                          TREYVEN LAFAUCI is an 88 y.o. male with a PMHx of chronic back pain, BPH, diverticulosis, ED, GERD, gross hematuria in 2015, HTN, osteoporosis and RBBB who presented to [facility] on Thursday afternoon for evaluation of sudden onset dizziness and double vision that began on Wednesday at 2230. The patient's symptoms started while he was walking such that he had to hold onto objects to get back to bed. Despite lying down, he felt persistently dizzy and was unable to walk without assistance for the remainder of the night. Son saw him the next morning and decided that his father should go to the ED to be evaluated. He was brought in by EMS. On arrival to the ED, he denied any focal weakness, numbness or aphasia and in Triage he demonstrated equal grips, no arm drift and clear speech. MIR brain revealed a subacute right cerebellar ischemic infarction. He was transferred to [facility] for stroke workup and management.    Past Medical History:  Diagnosis Date   Back pain, chronic    BPH (benign prostatic hypertrophy)    Diverticulosis of colon    LEFT   ED (erectile dysfunction)    GERD (gastroesophageal reflux disease) 09/15/2012   Gross hematuria    2015   HTN (hypertension)    Nocturia    Normal cardiac stress test    PER CARDIOLOGIST NOTE, DR NISHAN, APPROX.  1994   Osteoporosis    RBBB     Past Surgical History:  Procedure Laterality Date   CATARACT EXTRACTION W/ INTRAOCULAR LENS  IMPLANT, BILATERAL  2010   CYSTOSCOPY WITH RETROGRADE PYELOGRAM, URETEROSCOPY AND STENT PLACEMENT Right 12/25/2013   Procedure: CYSTOSCOPY WITH RIGHT RETROGRADE PYELOGRAM, RIGHT  URETEROSCOPY  BIOPSY AND STENT: BLADDER BIOPSY;  Surgeon: Claybon Jabs, MD;  Location: [facility];  Service: Urology;  Laterality: Right;   VERTEBROPLASTY  2001    Family History  Problem Relation Age of Onset   CAD Neg Hx    Diabetes Neg Hx    Colon cancer Neg Hx    Prostate cancer Neg Hx    Allergic rhinitis Neg Hx    Angioedema Neg Hx    Asthma Neg Hx    Atopy Neg Hx    Eczema Neg Hx    Immunodeficiency Neg Hx    Urticaria Neg Hx    Esophageal cancer Neg Hx  Social History:  reports that he has never smoked. He has never used smokeless tobacco. He reports that he does not drink alcohol and does not use drugs.  Allergies  Allergen Reactions   Bee Venom Anaphylaxis    MEDICATIONS:                                                                                                                     Prior to Admission:  Medications Prior to Admission  Medication Sig Dispense Refill Last Dose   amLODipine (NORVASC) 5 MG tablet Take 1 tablet (5 mg total) by mouth daily. 90 tablet 1 Past Week   Cholecalciferol (D3 ADULT PO) Take 1 tablet by mouth daily at 6 (six) AM.   Past Week   EPINEPHrine 0.3 mg/0.3 mL IJ SOAJ injection Inject 0.3 mg into the muscle as needed for anaphylaxis. INJECT 0.3MLS(CONTENTS OF 1 SYRINGE) INTO THE MUSCLE AS DIRECTED (Patient taking differently: Inject 0.3 mg into the muscle daily as needed for anaphylaxis.) 2 each 2 unknown   loratadine (CLARITIN) 10 MG tablet Take 1-2 tablets 1-2 times per day (Patient taking differently: Take 10 mg by mouth daily as needed for allergies.) 60 tablet 5 Past Week   losartan (COZAAR) 50 MG tablet TAKE 1 TABLET BY MOUTH EVERY DAY (Patient taking differently: Take 50 mg by mouth daily.) 90 tablet 2 Past Week   meloxicam (MOBIC) 15 MG tablet Take 15 mg by mouth daily.   Past Week   Multiple Vitamin (MULTIVITAMIN) tablet Take 1 tablet by mouth daily.   Past Week   Multiple Vitamins-Minerals  (PRESERVISION AREDS) CAPS Take 1 capsule by mouth daily.   Past Week   Nutritional Supplements (OSTEO ADVANCE PO) Take 4 tablets by mouth daily. OSTEO GOLD (BRAND)   Past Week   OMEGA-3 FATTY ACIDS PO Take 1 tablet by mouth daily.   Past Week   pantoprazole (PROTONIX) 40 MG tablet TAKE 1 TABLET (40 MG TOTAL) BY MOUTH TWICE A DAY BEFORE MEALS (Patient taking differently: Take 40 mg by mouth daily.) 180 tablet 1 Past Week   vitamin B-12 (CYANOCOBALAMIN) 500 MCG tablet Take 500 mcg by mouth daily.   Past Week   hydrocortisone (ANUSOL-HC) 25 MG suppository Place 1 suppository (25 mg total) rectally 2 (two) times daily as needed for hemorrhoids. (Patient not taking: Reported on 02/19/2022) 12 suppository 1 Not Taking   meloxicam (MOBIC) 7.5 MG tablet TAKE 1 TABLET BY MOUTH EVERY DAY (Patient not taking: Reported on 02/19/2022) 30 tablet 0 Not Taking    ROS:  Denied any dysphagia, head trauma, headache or neck pain. Other ROS as per HPI.    Blood pressure (!) 152/98, pulse 95, temperature 98.1 F (36.7 C), temperature source Oral, resp. rate 18, height '5\' 9"'$  (1.753 m), weight 71.3 kg, SpO2 100 %.   General Examination:                                                                                                       Physical Exam  HEENT-  /AT   Lungs- Respirations unlabored Extremities- No edema  Neurological Examination Mental Status: Alert, oriented x 5, thought content appropriate.  Speech fluent without evidence of aphasia.  Able to follow all commands without difficulty. Cranial Nerves: II: Temporal visual fields intact with no extinction to DSS. PERRL  III,IV, VI: No ptosis. EOMI. Low amplitude nystagmus with far left and far rightward gaze V: Temp sensation equal bilaterally  VII: Face with mildly decreased NL fold on the left at rest but with symmetric  muscle contraction when smiling.  VIII: Hearing intact to voice IX,X: No hoarseness XI: Symmetric shoulder shrug XII: Midline tongue extension Motor: BUE 5/5 proximally and distally BLE 5/5 proximally and distally  No pronator drift.  Sensory: Temp and light touch intact throughout, bilaterally. No extinction to DSS.  Deep Tendon Reflexes: 2+ and symmetric throughout Cerebellar: No ataxia with FNF bilaterally  Gait: Deferred    Lab Results: Basic Metabolic Panel: Recent Labs  Lab 02/19/22 1530  NA 141  K 3.9  CL 104  CO2 25  GLUCOSE 142*  BUN 22  CREATININE 1.02  CALCIUM 9.5    CBC: Recent Labs  Lab 02/19/22 1530  WBC 8.2  HGB 14.8  HCT 42.7  MCV 93.8  PLT 193    Cardiac Enzymes: No results for input(s): "CKTOTAL", "CKMB", "CKMBINDEX", "TROPONINI" in the last 168 hours.  Lipid Panel: No results for input(s): "CHOL", "TRIG", "HDL", "CHOLHDL", "VLDL", "LDLCALC" in the last 168 hours.  Imaging: CT ANGIO HEAD NECK W WO CM  Result Date: 02/19/2022 CLINICAL DATA:  Right PICA territory infarct. EXAM: CT ANGIOGRAPHY HEAD AND NECK TECHNIQUE: Multidetector CT imaging of the head and neck was performed using the standard protocol during bolus administration of intravenous contrast. Multiplanar CT image reconstructions and MIPs were obtained to evaluate the vascular anatomy. Carotid stenosis measurements (when applicable) are obtained utilizing NASCET criteria, using the distal internal carotid diameter as the denominator. RADIATION DOSE REDUCTION: This exam was performed according to the departmental dose-optimization program which includes automated exposure control, adjustment of the mA and/or kV according to patient size and/or use of iterative reconstruction technique. CONTRAST:  73m OMNIPAQUE IOHEXOL 350 MG/ML SOLN COMPARISON:  MRI of the brain February 19, 2022. FINDINGS: CT HEAD FINDINGS Brain: Hypodensity within the right inferior cerebellar hemisphere corresponding to  infarct described on recent MRI. No new acute infarct identified. Small remote infarct in the left thalamus. No acute hemorrhage, hydrocephalus, extra-axial collection or mass lesion. Vascular: No hyperdense vessel or unexpected calcification. Skull: Normal. Negative for fracture or focal lesion. Sinuses/Orbits: No acute finding. Other: None.  Review of the MIP images confirms the above findings CTA NECK FINDINGS Aortic arch: Standard branching. Imaged portion shows no evidence of aneurysm or dissection. Calcified plaques are noted in the aortic arch. No significant stenosis of the major arch vessel origins. Right carotid system: Calcified atherosclerotic plaque is seen in the right carotid bifurcation without hemodynamically significant stenosis. Increased tortuosity of the cervical segment of right ICA. Left carotid system: Calcified atherosclerotic plaque is seen in the left carotid bifurcation without hemodynamically significant stenosis. Increased tortuosity of the cervical. Vertebral arteries: Calcified atherosclerotic plaque is noted at the origin of the non dominant right vertebral artery resulting in mild stenosis. Calcified atherosclerotic plaque is seen at the origin of the dominant left vertebral artery without hemodynamically significant stenosis. Increased tortuosity of the V2 segment of the left vertebral artery is noted without hemodynamically significant stenosis. Skeleton: Degenerative changes of the cervical spine. No acute or aggressive process identified. Other neck: Subcentimeter left thyroid lobe nodule for which no follow-up is recommended. Upper chest: Mild pleuropulmonary apical scarring. Review of the MIP images confirms the above findings CTA HEAD FINDINGS Anterior circulation: Mild atherosclerotic plaques in the bilateral carotid siphons without hemodynamically significant stenosis. No significant stenosis, proximal occlusion, aneurysm or vascular malformation. Posterior circulation: Non  dominant intracranial right vertebral artery supplying primarily the right PICA which remains patent. The right vertebral artery is severely hypoplastic distal to the right PICA origin. The lung dominant left vertebral artery and left PICA are patent. Hypoplastic vertebrobasilar system related to presence of bilateral fetal PCA. Venous sinuses: As permitted by contrast timing, patent. Anatomic variants: Bilateral fetal PCAs. Review of the MIP images confirms the above findings IMPRESSION: 1. Small hypodense area within the right inferior cerebellar hemisphere corresponding to the right PICA territory infarct described on recent MRI of the brain. No new infarct identified. 2. No intracranial emergency large vessel occlusion identified. 3. Atherosclerotic changes at the origin of a non dominant right vertebral artery resulting in mild stenosis. 4. Mild atherosclerotic changes of the bilateral carotid bifurcation without hemodynamically significant stenosis. Electronically Signed   By: Pedro Earls M.D.   On: 02/19/2022 17:47   MR BRAIN WO CONTRAST  Result Date: 02/19/2022 CLINICAL DATA:  Dizziness EXAM: MRI HEAD WITHOUT CONTRAST TECHNIQUE: Multiplanar, multiecho pulse sequences of the brain and surrounding structures were obtained without intravenous contrast. COMPARISON:  None Available. FINDINGS: Brain: There is diffusion restriction with associated FLAIR signal abnormality in the right cerebellar hemisphere in the PICA distribution consistent with acute infarct. There is no hemorrhage or mass effect. There is no other evidence of acute infarct. There is no acute intracranial hemorrhage or extra-axial fluid collection. There is moderate global parenchymal volume loss with prominence of the ventricular system and extra-axial CSF spaces. There is a mild burden of chronic small vessel ischemic change in the supratentorial white matter. There is no mass lesion.  There is no mass effect or midline  shift. Vascular: Normal flow voids. Skull and upper cervical spine: Normal marrow signal. Sinuses/Orbits: The paranasal sinuses are clear. Bilateral lens implants are in place. The globes and orbits are otherwise unremarkable. Other: None. IMPRESSION: Acute infarct in the right cerebellar hemisphere in the PICA distribution without hemorrhage or mass effect. Electronically Signed   By: Valetta Mole M.D.   On: 02/19/2022 16:23     Assessment: 88 year old male presenting with dizziness, double vision and gait unsteadiness. MRI reveals a small to medium sized subacute right cerebellar stroke. Patient is symptomatically  improving since admission.  - Exam reveals nystagmus with far left and far right gaze. No appendicular ataxia noted.  - MRI brain: There is diffusion restriction with associated FLAIR signal abnormality in the right cerebellar hemisphere in the PICA distribution consistent with early subacute infarct. There is moderate global parenchymal volume loss, consistent with diffuse cerebral atrophy. There is a mild burden of chronic small vessel ischemic change in the supratentorial white matter.  - CTA of head and neck: No intracranial emergent large vessel occlusion identified. Atherosclerotic changes at the origin of a non dominant right vertebral artery resulting in mild stenosis. Mild atherosclerotic changes of the bilateral carotid bifurcations without hemodynamically significant stenosis.  - EKG: Normal sinus rhythm; Right bundle branch block; Septal infarct , age undetermined; when compared with ECG of 28-Aug-2020 the QT interval has lengthened - Stroke risk factors:  ED (a diagnosis of ED is associated with a significantly increased risk of stroke with RR of 1.35), advanced age and HTN  - Given atherosclerotic changes seen on CTA, including the right [place], the most likely etiology of the stroke is felt to be atherothrombotic. Cardioembolic stroke also possible but felt to be less likely.    Recommendations: - Start ASA 325 mg po qd - Start atorvastatin 40 mg po qd. Obtain baseline CK level.  - BP management. Out of the permissive HTN time window.  - HgbA1c, fasting lipid panel - TTE - Cardiac telemetry - PT consult, OT consult, Speech consult - Risk factor modification - Frequent neuro checks - NPO until passes stroke swallow screen   Electronically signed: Dr. Kerney Elbe 02/19/2022, 9:23 PM

## 2022-02-19 NOTE — ED Triage Notes (Signed)
Sudden onset of dizziness yesterday at 22:30.  EMS came out this am and evaluated him and they advised to bring pt in.  Pt denies any focal weakness or numbness or changes in speech.  On exam in triage pt has equal grip strength, no arm drift, is alert and oriented with clear speech. Pt states that he once had similar symptoms when he had a UTI.

## 2022-02-20 ENCOUNTER — Observation Stay (HOSPITAL_BASED_OUTPATIENT_CLINIC_OR_DEPARTMENT_OTHER): Payer: Medicare HMO

## 2022-02-20 ENCOUNTER — Encounter (HOSPITAL_COMMUNITY): Payer: Self-pay | Admitting: Internal Medicine

## 2022-02-20 DIAGNOSIS — I6389 Other cerebral infarction: Secondary | ICD-10-CM

## 2022-02-20 DIAGNOSIS — I639 Cerebral infarction, unspecified: Secondary | ICD-10-CM | POA: Diagnosis not present

## 2022-02-20 DIAGNOSIS — E7800 Pure hypercholesterolemia, unspecified: Secondary | ICD-10-CM | POA: Diagnosis not present

## 2022-02-20 DIAGNOSIS — H532 Diplopia: Secondary | ICD-10-CM | POA: Diagnosis not present

## 2022-02-20 DIAGNOSIS — E876 Hypokalemia: Secondary | ICD-10-CM

## 2022-02-20 DIAGNOSIS — I10 Essential (primary) hypertension: Secondary | ICD-10-CM | POA: Diagnosis not present

## 2022-02-20 DIAGNOSIS — I63541 Cerebral infarction due to unspecified occlusion or stenosis of right cerebellar artery: Secondary | ICD-10-CM | POA: Diagnosis not present

## 2022-02-20 DIAGNOSIS — K2100 Gastro-esophageal reflux disease with esophagitis, without bleeding: Secondary | ICD-10-CM | POA: Diagnosis not present

## 2022-02-20 LAB — COMPREHENSIVE METABOLIC PANEL
ALT: 14 U/L (ref 0–44)
AST: 18 U/L (ref 15–41)
Albumin: 3.6 g/dL (ref 3.5–5.0)
Alkaline Phosphatase: 56 U/L (ref 38–126)
Anion gap: 9 (ref 5–15)
BUN: 18 mg/dL (ref 8–23)
CO2: 23 mmol/L (ref 22–32)
Calcium: 9.2 mg/dL (ref 8.9–10.3)
Chloride: 109 mmol/L (ref 98–111)
Creatinine, Ser: 1.09 mg/dL (ref 0.61–1.24)
GFR, Estimated: >60 mL/min (ref >60)
Glucose, Bld: 108 mg/dL — ABNORMAL HIGH (ref 70–99)
Potassium: 3.4 mmol/L — ABNORMAL LOW (ref 3.5–5.1)
Sodium: 141 mmol/L (ref 135–145)
Total Bilirubin: 1.1 mg/dL (ref 0.3–1.2)
Total Protein: 5.8 g/dL — ABNORMAL LOW (ref 6.5–8.1)

## 2022-02-20 LAB — CBC WITH DIFFERENTIAL/PLATELET
Abs Immature Granulocytes: 0.02 10*3/uL (ref 0.00–0.07)
Basophils Absolute: 0 10*3/uL (ref 0.0–0.1)
Basophils Relative: 0 %
Eosinophils Absolute: 0.1 10*3/uL (ref 0.0–0.5)
Eosinophils Relative: 1 %
HCT: 39 % (ref 39.0–52.0)
Hemoglobin: 13.7 g/dL (ref 13.0–17.0)
Immature Granulocytes: 0 %
Lymphocytes Relative: 29 %
Lymphs Abs: 2.9 10*3/uL (ref 0.7–4.0)
MCH: 32.4 pg (ref 26.0–34.0)
MCHC: 35.1 g/dL (ref 30.0–36.0)
MCV: 92.2 fL (ref 80.0–100.0)
Monocytes Absolute: 1 10*3/uL (ref 0.1–1.0)
Monocytes Relative: 10 %
Neutro Abs: 5.9 10*3/uL (ref 1.7–7.7)
Neutrophils Relative %: 60 %
Platelets: 170 10*3/uL (ref 150–400)
RBC: 4.23 MIL/uL (ref 4.22–5.81)
RDW: 12.1 % (ref 11.5–15.5)
WBC: 9.9 10*3/uL (ref 4.0–10.5)
nRBC: 0 % (ref 0.0–0.2)

## 2022-02-20 LAB — ECHOCARDIOGRAM COMPLETE
AR max vel: 4.15 cm2
AV Area VTI: 4.12 cm2
AV Area mean vel: 3.99 cm2
AV Mean grad: 3 mmHg
AV Peak grad: 5.1 mmHg
Ao pk vel: 1.13 m/s
Area-P 1/2: 2.5 cm2
Height: 69 in
MV M vel: 2.58 m/s
MV Peak grad: 26.6 mmHg
P 1/2 time: 533 msec
S' Lateral: 3.5 cm
Weight: 2518.54 oz

## 2022-02-20 LAB — HEMOGLOBIN A1C
Hgb A1c MFr Bld: 5.9 % — ABNORMAL HIGH (ref 4.8–5.6)
Mean Plasma Glucose: 122.63 mg/dL

## 2022-02-20 LAB — LIPID PANEL
Cholesterol: 146 mg/dL (ref 0–200)
HDL: 52 mg/dL (ref >40)
LDL Cholesterol: 84 mg/dL (ref 0–99)
Total CHOL/HDL Ratio: 2.8 RATIO
Triglycerides: 52 mg/dL (ref <150)
VLDL: 10 mg/dL (ref 0–40)

## 2022-02-20 LAB — MAGNESIUM: Magnesium: 1.8 mg/dL (ref 1.7–2.4)

## 2022-02-20 MED ORDER — ASPIRIN 81 MG PO TBEC: 81.0000 mg | DELAYED_RELEASE_TABLET | Freq: Every day | ORAL | 12 refills | Status: AC

## 2022-02-20 MED ORDER — CLOPIDOGREL BISULFATE 75 MG PO TABS
75.0000 mg | ORAL_TABLET | Freq: Every day | ORAL | Status: DC
  Administered 2022-02-20: 75 mg via ORAL
  Filled 2022-02-20: qty 1

## 2022-02-20 MED ORDER — CLOPIDOGREL BISULFATE 75 MG PO TABS: 75.0000 mg | ORAL_TABLET | Freq: Every day | ORAL | 0 refills | Status: AC

## 2022-02-20 MED ORDER — POTASSIUM CHLORIDE CRYS ER 20 MEQ PO TBCR
40.0000 meq | EXTENDED_RELEASE_TABLET | Freq: Once | ORAL | Status: AC
  Administered 2022-02-20: 40 meq via ORAL
  Filled 2022-02-20: qty 2

## 2022-02-20 MED ORDER — POTASSIUM CHLORIDE CRYS ER 20 MEQ PO TBCR: 40.0000 meq | EXTENDED_RELEASE_TABLET | Freq: Once | ORAL | Status: DC

## 2022-02-20 MED ORDER — ROSUVASTATIN CALCIUM 5 MG PO TABS
10.0000 mg | ORAL_TABLET | Freq: Every day | ORAL | Status: DC
  Administered 2022-02-20: 10 mg via ORAL
  Filled 2022-02-20: qty 2

## 2022-02-20 MED ORDER — ASPIRIN 81 MG PO TBEC
81.0000 mg | DELAYED_RELEASE_TABLET | Freq: Every day | ORAL | Status: DC
  Administered 2022-02-20: 81 mg via ORAL
  Filled 2022-02-20: qty 1

## 2022-02-20 MED ORDER — ROSUVASTATIN CALCIUM 10 MG PO TABS: 10.0000 mg | ORAL_TABLET | Freq: Every day | ORAL | 2 refills | Status: DC

## 2022-02-20 MED ORDER — LABETALOL HCL 5 MG/ML IV SOLN: 10.0000 mg | INTRAVENOUS | Status: DC | PRN

## 2022-02-20 MED ORDER — PANTOPRAZOLE SODIUM 40 MG PO TBEC
40.0000 mg | DELAYED_RELEASE_TABLET | Freq: Every day | ORAL | Status: DC
  Administered 2022-02-20: 40 mg via ORAL
  Filled 2022-02-20: qty 1

## 2022-02-20 NOTE — TOC Transition Note (Signed)
Transition of Care [facility]) - CM/SW Discharge Note   Patient Details  Name: Cory Zavala MRN: [id number] Date of Birth: 08-Oct-1930  [facility] CM/SW Contact:  Pollie Friar, RN Phone Number: 02/20/2022, 1:09 PM   Clinical Narrative:    Patient is from home with his spouse. They are together all the time.  Pt was driving pre hospitalization but spouse can provide needed transportation. Pt manages his own medications at home and denies any issues.  Outpatient therapy arranged through [facility]. Information on the AVS. Pt has transport home when discharged.   Final next level of care: OP Rehab Barriers to Discharge: No Barriers Identified   Patient Goals and CMS Choice     Choice offered to / list presented to : Patient, Spouse, Adult Children  Discharge Placement                       Discharge Plan and Services                                     Social Determinants of Health (SDOH) Interventions     Readmission Risk Interventions     No data to display

## 2022-02-20 NOTE — H&P (Addendum)
History and Physical    PLEASE NOTE THAT DRAGON DICTATION SOFTWARE WAS USED IN THE CONSTRUCTION OF THIS NOTE.   MANUS WEEDMAN IDP:[id number] DOB: Feb 14, 1931 DOA: 02/19/2022  PCP: Colon Branch, MD  Patient coming from: home   I have personally briefly reviewed patient's old medical records in [place]  Chief Complaint: Dizziness  HPI: Cory Zavala is a 88 y.o. male with medical history significant for essential hypertension, who is admitted to [facility] on 02/19/2022 by way of transfer from [facility] emergency department with acute ischemic stroke after presenting from home to the latter facility complaining of dizziness.  The patient reports sudden onset dizziness starting at 2230 on 02/18/2022.  After noting this dizziness, he went to bed for the evening, before subsequently finding persistence of this symptom up on waking up on the morning of 02/19/22.  He ultimately presented to [facility] emergency department for further evaluation and management thereof in the context of persistence of this dizziness.  He denies overt sensation of the room spinning as relates to his presenting dizziness.  Denies any recent preceding trauma.  Not associate with any acute focal weakness, numbness, paresthesias, dysphagia, nausea, vomiting, acute change in vision, word finding difficulties, slurred speech, facial droop, or headache.   He also denies any recent chest pain, shortness of breath, palpitations, diaphoresis.   Denies any prior history of stroke.  He confirms medical history notable for essential hypertension, while denying any known history of hyperlipidemia, paroxysmal atrial fibrillation, diabetes, obstructive sleep apnea.  He also confirms that he is a lifelong non-smoker.  Per chart review, most recent hemoglobin A1c found to be 6.0% in December 2022.  Not on any blood thinners as an outpatient, including no baby aspirin.  Not currently on a statin.     [facility] ED Course:   Vital signs in the ED were notable for the following: Afebrile; heart rate 44-31; systolic blood pressures in the 140s to 160s; respiratory rate 15-21, oxygen saturation 98 to 100% on room air.  Labs were notable for the following: BMP notable for the following: Sodium 141, creatinine 1.02, glucose 142; high-sensitivity troponin IV x2 values within to be 8 2.  CBC notable for white blood cell count 8200, hemoglobin 14.8.  Urinalysis notable for no white blood cells.  Imaging and additional notable ED work-up: EKG shows sinus rhythm with right bundle branch block, heart rate 81, and no evidence of T wave or ST changes, including no evidence of ST elevation.  By the time the patient presented [place] emergency department, he is already outside of the window for consideration for tPA administration.  As result, initial imaging was MRI brain, which showed acute infarct in the right cerebellar hemisphere and PICA distribution, without corresponding evidence of hemorrhage or mass effect.  Ensuing CTA head and neck showed no evidence of emergent large vessel occlusion.  EDP discussed patient's case/imaging with on-call neurology, recommended admission to the hospitalist at [facility] for further acute ischemic stroke work-up.  While in the ED, the following were administered: Full dose aspirin x1.  Subsequently, the patient was admitted for overnight observation to [facility] for further evaluation management of presenting acute ischemic stroke.    Review of Systems: As per HPI otherwise 10 point review of systems negative.   Past Medical History:  Diagnosis Date   Back pain, chronic    BPH (benign prostatic hypertrophy)    Diverticulosis of colon    LEFT   ED (erectile  dysfunction)    GERD (gastroesophageal reflux disease) 09/15/2012   Gross hematuria    2015   HTN (hypertension)    Nocturia    Normal cardiac stress test    PER CARDIOLOGIST NOTE, DR NISHAN, APPROX.  1994   Osteoporosis     RBBB     Past Surgical History:  Procedure Laterality Date   CATARACT EXTRACTION W/ INTRAOCULAR LENS  IMPLANT, BILATERAL  2010   CYSTOSCOPY WITH RETROGRADE PYELOGRAM, URETEROSCOPY AND STENT PLACEMENT Right 12/25/2013   Procedure: CYSTOSCOPY WITH RIGHT RETROGRADE PYELOGRAM, RIGHT URETEROSCOPY  BIOPSY AND STENT: BLADDER BIOPSY;  Surgeon: Claybon Jabs, MD;  Location: [facility];  Service: Urology;  Laterality: Right;   VERTEBROPLASTY  2001    Social History:  reports that he has never smoked. He has never used smokeless tobacco. He reports that he does not drink alcohol and does not use drugs.   Allergies  Allergen Reactions   Bee Venom Anaphylaxis    Family History  Problem Relation Age of Onset   CAD Neg Hx    Diabetes Neg Hx    Colon cancer Neg Hx    Prostate cancer Neg Hx    Allergic rhinitis Neg Hx    Angioedema Neg Hx    Asthma Neg Hx    Atopy Neg Hx    Eczema Neg Hx    Immunodeficiency Neg Hx    Urticaria Neg Hx    Esophageal cancer Neg Hx     Family history reviewed and not pertinent    Prior to Admission medications   Medication Sig Start Date End Date Taking? Authorizing Provider  amLODipine (NORVASC) 5 MG tablet Take 1 tablet (5 mg total) by mouth daily. 04/07/21  Yes Paz, Alda Berthold, MD  Cholecalciferol (D3 ADULT PO) Take 1 tablet by mouth daily at 6 (six) AM.   Yes [provider]  EPINEPHrine 0.3 mg/0.3 mL IJ SOAJ injection Inject 0.3 mg into the muscle as needed for anaphylaxis. INJECT 0.3MLS(CONTENTS OF 1 SYRINGE) INTO THE MUSCLE AS DIRECTED Patient taking differently: Inject 0.3 mg into the muscle daily as needed for anaphylaxis. 12/27/20  Yes Paz, Alda Berthold, MD  loratadine (CLARITIN) 10 MG tablet Take 1-2 tablets 1-2 times per day Patient taking differently: Take 10 mg by mouth daily as needed for allergies. 10/17/19  Yes Kozlow, Donnamarie Poag, MD  losartan (COZAAR) 50 MG tablet TAKE 1 TABLET BY MOUTH EVERY DAY Patient taking differently:  Take 50 mg by mouth daily. 11/05/21  Yes Paz, Alda Berthold, MD  meloxicam (MOBIC) 15 MG tablet Take 15 mg by mouth daily. 02/11/22  Yes [provider]  Multiple Vitamin (MULTIVITAMIN) tablet Take 1 tablet by mouth daily.   Yes [provider]  Multiple Vitamins-Minerals (PRESERVISION AREDS) CAPS Take 1 capsule by mouth daily.   Yes [provider]  Nutritional Supplements (OSTEO ADVANCE PO) Take 4 tablets by mouth daily. OSTEO GOLD (BRAND)   Yes [provider]  OMEGA-3 FATTY ACIDS PO Take 1 tablet by mouth daily.   Yes [provider]  pantoprazole (PROTONIX) 40 MG tablet TAKE 1 TABLET (40 MG TOTAL) BY MOUTH TWICE A DAY BEFORE MEALS Patient taking differently: Take 40 mg by mouth daily. 01/15/22  Yes Paz, Alda Berthold, MD  vitamin B-12 (CYANOCOBALAMIN) 500 MCG tablet Take 500 mcg by mouth daily.   Yes [provider]  hydrocortisone (ANUSOL-HC) 25 MG suppository Place 1 suppository (25 mg total) rectally 2 (two) times daily as needed  for hemorrhoids. Patient not taking: Reported on 02/19/2022 03/25/20   Colon Branch, MD  meloxicam ([place]) 7.5 MG tablet TAKE 1 TABLET BY MOUTH EVERY DAY Patient not taking: Reported on 02/19/2022 11/11/21   Colon Branch, MD     Objective    Physical Exam: Vitals:   02/19/22 1930 02/19/22 2033 02/19/22 2042 02/19/22 2330  BP: (!) 164/93 (!) 152/98  135/86  Pulse: 88 95  64  Resp: 18   18  Temp:  98.1 F (36.7 C)  98.9 F (37.2 C)  TempSrc:  Oral  Oral  SpO2: 98% 100%  98%  Weight:   71.3 kg   Height:   '5\' 9"'$  (1.753 m)     General: appears to be stated age; alert, oriented Skin: warm, dry, no rash Head:  AT/[place] Mouth:  Oral mucosa membranes appear moist, normal dentition Neck: supple; trachea midline Heart:  RRR; did not appreciate any M/R/G Lungs: CTAB, did not appreciate any wheezes, rales, or rhonchi Abdomen: + BS; soft, ND, NT Vascular: 2+ pedal pulses b/l; 2+ radial pulses b/l Extremities: no peripheral  edema, no muscle wasting Neuro: Neuro: 5/5 strength of the proximal and distal flexors and extensors of the upper and lower extremities bilaterally; sensation intact in upper and lower extremities b/l; cranial nerves II through XII grossly intact; no pronator drift; no evidence suggestive of slurred speech, dysarthria, or facial droop; Normal muscle tone. No tremors.     Labs on Admission: I have personally reviewed following labs and imaging studies  CBC: Recent Labs  Lab 02/19/22 1530  WBC 8.2  HGB 14.8  HCT 42.7  MCV 93.8  PLT 409   Basic Metabolic Panel: Recent Labs  Lab 02/19/22 1530  NA 141  K 3.9  CL 104  CO2 25  GLUCOSE 142*  BUN 22  CREATININE 1.02  CALCIUM 9.5   GFR: Estimated Creatinine Clearance: 49.1 mL/min (by C-G formula based on SCr of 1.02 mg/dL). Liver Function Tests: No results for input(s): "AST", "ALT", "ALKPHOS", "BILITOT", "PROT", "ALBUMIN" in the last 168 hours. No results for input(s): "LIPASE", "AMYLASE" in the last 168 hours. No results for input(s): "AMMONIA" in the last 168 hours. Coagulation Profile: No results for input(s): "INR", "PROTIME" in the last 168 hours. Cardiac Enzymes: No results for input(s): "CKTOTAL", "CKMB", "CKMBINDEX", "TROPONINI" in the last 168 hours. BNP (last 3 results) No results for input(s): "PROBNP" in the last 8760 hours. HbA1C: No results for input(s): "HGBA1C" in the last 72 hours. CBG: No results for input(s): "GLUCAP" in the last 168 hours. Lipid Profile: No results for input(s): "CHOL", "HDL", "LDLCALC", "TRIG", "CHOLHDL", "LDLDIRECT" in the last 72 hours. Thyroid Function Tests: No results for input(s): "TSH", "T4TOTAL", "FREET4", "T3FREE", "THYROIDAB" in the last 72 hours. Anemia Panel: No results for input(s): "VITAMINB12", "FOLATE", "FERRITIN", "TIBC", "IRON", "RETICCTPCT" in the last 72 hours. Urine analysis:    Component Value Date/Time   COLORURINE YELLOW 02/19/2022 1530   APPEARANCEUR CLEAR  02/19/2022 1530   LABSPEC 1.014 02/19/2022 1530   PHURINE 7.0 02/19/2022 1530   GLUCOSEU NEGATIVE 02/19/2022 1530   GLUCOSEU NEGATIVE 12/21/2018 1425   HGBUR NEGATIVE 02/19/2022 1530   BILIRUBINUR NEGATIVE 02/19/2022 1530   KETONESUR 15 (A) 02/19/2022 1530   PROTEINUR NEGATIVE 02/19/2022 1530   UROBILINOGEN 0.2 08/28/2020 1328   NITRITE NEGATIVE 02/19/2022 1530   LEUKOCYTESUR NEGATIVE 02/19/2022 1530    Radiological Exams on Admission: CT ANGIO HEAD NECK W WO CM  Result Date: 02/19/2022 CLINICAL DATA:  Right PICA territory infarct. EXAM: CT ANGIOGRAPHY HEAD AND NECK TECHNIQUE: Multidetector CT imaging of the head and neck was performed using the standard protocol during bolus administration of intravenous contrast. Multiplanar CT image reconstructions and MIPs were obtained to evaluate the vascular anatomy. Carotid stenosis measurements (when applicable) are obtained utilizing NASCET criteria, using the distal internal carotid diameter as the denominator. RADIATION DOSE REDUCTION: This exam was performed according to the departmental dose-optimization program which includes automated exposure control, adjustment of the mA and/or kV according to patient size and/or use of iterative reconstruction technique. CONTRAST:  81m OMNIPAQUE IOHEXOL 350 MG/ML SOLN COMPARISON:  MRI of the brain February 19, 2022. FINDINGS: CT HEAD FINDINGS Brain: Hypodensity within the right inferior cerebellar hemisphere corresponding to infarct described on recent MRI. No new acute infarct identified. Small remote infarct in the left thalamus. No acute hemorrhage, hydrocephalus, extra-axial collection or mass lesion. Vascular: No hyperdense vessel or unexpected calcification. Skull: Normal. Negative for fracture or focal lesion. Sinuses/Orbits: No acute finding. Other: None. Review of the MIP images confirms the above findings CTA NECK FINDINGS Aortic arch: Standard branching. Imaged portion shows no evidence of aneurysm or  dissection. Calcified plaques are noted in the aortic arch. No significant stenosis of the major arch vessel origins. Right carotid system: Calcified atherosclerotic plaque is seen in the right carotid bifurcation without hemodynamically significant stenosis. Increased tortuosity of the cervical segment of right ICA. Left carotid system: Calcified atherosclerotic plaque is seen in the left carotid bifurcation without hemodynamically significant stenosis. Increased tortuosity of the cervical. Vertebral arteries: Calcified atherosclerotic plaque is noted at the origin of the non dominant right vertebral artery resulting in mild stenosis. Calcified atherosclerotic plaque is seen at the origin of the dominant left vertebral artery without hemodynamically significant stenosis. Increased tortuosity of the V2 segment of the left vertebral artery is noted without hemodynamically significant stenosis. Skeleton: Degenerative changes of the cervical spine. No acute or aggressive process identified. Other neck: Subcentimeter left thyroid lobe nodule for which no follow-up is recommended. Upper chest: Mild pleuropulmonary apical scarring. Review of the MIP images confirms the above findings CTA HEAD FINDINGS Anterior circulation: Mild atherosclerotic plaques in the bilateral carotid siphons without hemodynamically significant stenosis. No significant stenosis, proximal occlusion, aneurysm or vascular malformation. Posterior circulation: Non dominant intracranial right vertebral artery supplying primarily the right PICA which remains patent. The right vertebral artery is severely hypoplastic distal to the right PICA origin. The lung dominant left vertebral artery and left PICA are patent. Hypoplastic vertebrobasilar system related to presence of bilateral fetal PCA. Venous sinuses: As permitted by contrast timing, patent. Anatomic variants: Bilateral fetal PCAs. Review of the MIP images confirms the above findings IMPRESSION: 1.  Small hypodense area within the right inferior cerebellar hemisphere corresponding to the right PICA territory infarct described on recent MRI of the brain. No new infarct identified. 2. No intracranial emergency large vessel occlusion identified. 3. Atherosclerotic changes at the origin of a non dominant right vertebral artery resulting in mild stenosis. 4. Mild atherosclerotic changes of the bilateral carotid bifurcation without hemodynamically significant stenosis. Electronically Signed   By: KPedro EarlsM.D.   On: 02/19/2022 17:47   MR BRAIN WO CONTRAST  Result Date: 02/19/2022 CLINICAL DATA:  Dizziness EXAM: MRI HEAD WITHOUT CONTRAST TECHNIQUE: Multiplanar, multiecho pulse sequences of the brain and surrounding structures were obtained without intravenous contrast. COMPARISON:  None Available. FINDINGS: Brain: There is diffusion restriction with associated FLAIR signal abnormality in the right cerebellar hemisphere  in the PICA distribution consistent with acute infarct. There is no hemorrhage or mass effect. There is no other evidence of acute infarct. There is no acute intracranial hemorrhage or extra-axial fluid collection. There is moderate global parenchymal volume loss with prominence of the ventricular system and extra-axial CSF spaces. There is a mild burden of chronic small vessel ischemic change in the supratentorial white matter. There is no mass lesion.  There is no mass effect or midline shift. Vascular: Normal flow voids. Skull and upper cervical spine: Normal marrow signal. Sinuses/Orbits: The paranasal sinuses are clear. Bilateral lens implants are in place. The globes and orbits are otherwise unremarkable. Other: None. IMPRESSION: Acute infarct in the right cerebellar hemisphere in the PICA distribution without hemorrhage or mass effect. Electronically Signed   By: Valetta Mole M.D.   On: 02/19/2022 16:23     EKG: Independently reviewed, with result as described above.     Assessment/Plan   Principal Problem:   Acute ischemic stroke [facility]) Active Problems:   Essential hypertension   GERD (gastroesophageal reflux disease)   Cerebral infarction involving right cerebellar artery ([place])     #) Acute ischemic stroke: In the setting of acute onset of dizziness, with last known normal 2230  on 02/18/22, MRI brain shows evidence of acute infarct in right cerebellar hemisphere, as above, while CTA head and neck shows no evidence of emergent large vessel occlusion.  Not a candidate for tPA, as he presented outside of window for consideration of such.  ED pediatric edition discussed patient's case with on-call neurology, recommending admission to the hospital service for further acute ischemic stroke work-up, including further assessment for potential modifiable ischemic CVA risk factors.neurology to formally consult, with additional recommendations to follow, including recommendations regarding the possibility of initiation of brief course of dual antiplatelet therapy.   In terms of potential modifiable ischemic CVA risk factors, the patient has a known history of essential hypertension.   Not on any blood thinners as an outpatient.  Status post full dose aspirin x1 administered at [facility] earlier today.  Not currently on any antilipid medications at home.  Will allow for permissive hypertension for 24-48 hours following onset of acute focal neurologic deficits, with associated parameters further quantified below, and with period of observance of permissive hypertension to end at  2230 on 02/20/22.    Plan: Nursing bedside swallow evaluation x 1 now, and will not initiate oral medications or diet until the patient has passed this. Head of the bed at 30 degrees. Neuro checks per protocol. VS per protocol. Will allow for permissive hypertension for 24-48 hours following onset of acute focal neurologic deficits, during which will hold home antihypertensive medications,  with prn IV labetalol ordered for systolic blood pressure greater than 998 mmHg or diastolic blood pressure greater than 120 mmHg until 2230 on 02/20/2022. Monitor on telemetry, including monitoring for atrial fibrillation as modifiable risk factor for acute ischemic CVA.   TTE without bubble study has been ordered for the morning.  And further assessment for modifiable ischemic CVA risk factors, will check lipid panel and A1c. PT/OT/ST consults have been ordered to occur in the morning. Neurology consulted, as above.  Fall precautions ordered.  Consult placed for vestibular physical therapy.           #) Essential Hypertension: documented h/o such, with outpatient antihypertensive regimen including losartan, Norvasc.  SBP's in the ED today: 140s to 160s mmHg.   Plan: Close monitoring of subsequent BP via routine VS.  holding home antihypertensive medications for now and observance of permissive hypertension till 2230 on 02/20/22, as further detailed above.  Prn IV labetalol, with parameters as defined above.         #) GERD: documented h/o such; on Protonix as outpatient.   Plan: continue home PPI.       DVT prophylaxis: SCD's   Code Status: Full code Family Communication: none Disposition Plan: Per Rounding Team Consults called: EDP d/w with on-call neurology, who will formally consult. Dr.Lindzen consulting;  Admission status: Observation    PLEASE NOTE THAT DRAGON DICTATION SOFTWARE WAS USED IN THE CONSTRUCTION OF THIS NOTE.   [place] DO [facility]  From [place]   02/20/2022, 2:47 AM

## 2022-02-20 NOTE — Progress Notes (Signed)
STROKE TEAM PROGRESS NOTE   SUBJECTIVE (INTERVAL HISTORY) His wife, 2 sons are at the bedside.  Overall his condition is rapidly improving.  He still has horizontal diplopia on left gaze, but other symptoms much improved, denies any dizziness, headache.  Has worked with PT OT, recommend outpatient therapy.   OBJECTIVE Temp:  [98.1 F (36.7 C)-99 F (37.2 C)] 98.3 F (36.8 C) (09/08 1559) Pulse Rate:  [64-97] 65 (09/08 1559) Cardiac Rhythm: Normal sinus rhythm (09/08 0700) Resp:  [15-21] 16 (09/08 1559) BP: (126-164)/(72-98) 147/83 (09/08 1559) SpO2:  [89 %-100 %] 98 % (09/08 1559) Weight:  [71.3 kg-71.4 kg] 71.4 kg (09/08 0500)  No results for input(s): "GLUCAP" in the last 168 hours. Recent Labs  Lab 02/19/22 1530 02/20/22 0341  NA 141 141  K 3.9 3.4*  CL 104 109  CO2 25 23  GLUCOSE 142* 108*  BUN 22 18  CREATININE 1.02 1.09  CALCIUM 9.5 9.2  MG  --  1.8   Recent Labs  Lab 02/20/22 0341  AST 18  ALT 14  ALKPHOS 56  BILITOT 1.1  PROT 5.8*  ALBUMIN 3.6   Recent Labs  Lab 02/19/22 1530 02/20/22 0341  WBC 8.2 9.9  NEUTROABS  --  5.9  HGB 14.8 13.7  HCT 42.7 39.0  MCV 93.8 92.2  PLT 193 170   No results for input(s): "CKTOTAL", "CKMB", "CKMBINDEX", "TROPONINI" in the last 168 hours. No results for input(s): "LABPROT", "INR" in the last 72 hours. Recent Labs    02/19/22 1530  COLORURINE YELLOW  LABSPEC 1.014  PHURINE 7.0  GLUCOSEU NEGATIVE  HGBUR NEGATIVE  BILIRUBINUR NEGATIVE  KETONESUR 15*  PROTEINUR NEGATIVE  NITRITE NEGATIVE  LEUKOCYTESUR NEGATIVE       Component Value Date/Time   CHOL 146 02/20/2022 0341   TRIG 52 02/20/2022 0341   TRIG 89 05/24/2006 1038   HDL 52 02/20/2022 0341   CHOLHDL 2.8 02/20/2022 0341   VLDL 10 02/20/2022 0341   LDLCALC 84 02/20/2022 0341   Lab Results  Component Value Date   HGBA1C 5.9 (H) 02/20/2022   No results found for: "LABOPIA", "COCAINSCRNUR", "LABBENZ", "AMPHETMU", "THCU", "LABBARB"  No results for  input(s): "ETH" in the last 168 hours.  I have personally reviewed the radiological images below and agree with the radiology interpretations.  ECHOCARDIOGRAM COMPLETE  Result Date: 02/20/2022    ECHOCARDIOGRAM REPORT   Patient Name:   Cory Zavala Date of Exam: 02/20/2022 Medical Rec #:  [id number]     Height:       69.0 in Accession #:    [id number]    Weight:       157.4 lb Date of Birth:  07-13-1930      BSA:          1.866 m Patient Age:    88 years      BP:           149/87 mmHg Patient Gender: M             HR:           64 bpm. Exam Location:  Inpatient Procedure: 2D Echo, Cardiac Doppler and Color Doppler Indications:    Stroke  History:        Patient has no prior history of Echocardiogram examinations.                 Risk Factors:Hypertension.  Sonographer:    Memory Argue Referring Phys: [id number] Jay  1. Left ventricular ejection fraction, by estimation, is 60 to 65%. The left ventricle has normal function. The left ventricle has no regional wall motion abnormalities. There is mild left ventricular hypertrophy of the basal-septal segment. Left ventricular diastolic parameters are consistent with Grade I diastolic dysfunction (impaired relaxation).  2. Right ventricular systolic function is normal. The right ventricular size is normal. There is mildly elevated pulmonary artery systolic pressure.  3. Left atrial size was mildly dilated.  4. The mitral valve is normal in structure. Mild mitral valve regurgitation. No evidence of mitral stenosis.  5. Tricuspid valve regurgitation is moderate.  6. The aortic valve is tricuspid. Aortic valve regurgitation is mild. Aortic valve sclerosis is present, with no evidence of aortic valve stenosis.  7. The inferior vena cava is dilated in size with >50% respiratory variability, suggesting right atrial pressure of 8 mmHg. Comparison(s): No prior Echocardiogram. FINDINGS  Left Ventricle: Left ventricular ejection fraction, by estimation, is  60 to 65%. The left ventricle has normal function. The left ventricle has no regional wall motion abnormalities. The left ventricular internal cavity size was normal in size. There is  mild left ventricular hypertrophy of the basal-septal segment. Left ventricular diastolic parameters are consistent with Grade I diastolic dysfunction (impaired relaxation). Right Ventricle: The right ventricular size is normal. Right ventricular systolic function is normal. There is mildly elevated pulmonary artery systolic pressure. The tricuspid regurgitant velocity is 2.74 m/s, and with an assumed right atrial pressure of 8 mmHg, the estimated right ventricular systolic pressure is 38.2 mmHg. Left Atrium: Left atrial size was mildly dilated. Right Atrium: Right atrial size was normal in size. Pericardium: There is no evidence of pericardial effusion. Mitral Valve: The mitral valve is normal in structure. Mild mitral valve regurgitation. No evidence of mitral valve stenosis. Tricuspid Valve: The tricuspid valve is normal in structure. Tricuspid valve regurgitation is moderate . No evidence of tricuspid stenosis. Aortic Valve: The aortic valve is tricuspid. Aortic valve regurgitation is mild. Aortic regurgitation PHT measures 533 msec. Aortic valve sclerosis is present, with no evidence of aortic valve stenosis. Aortic valve mean gradient measures 3.0 mmHg. Aortic valve peak gradient measures 5.1 mmHg. Aortic valve area, by VTI measures 4.12 cm. Pulmonic Valve: The pulmonic valve was normal in structure. Pulmonic valve regurgitation is trivial. No evidence of pulmonic stenosis. Aorta: The aortic root is normal in size and structure. Venous: The inferior vena cava is dilated in size with greater than 50% respiratory variability, suggesting right atrial pressure of 8 mmHg. IAS/Shunts: No atrial level shunt detected by color flow Doppler.  LEFT VENTRICLE PLAX 2D LVIDd:         5.00 cm   Diastology LVIDs:         3.50 cm   LV e'  medial:    5.44 cm/s LV PW:         0.90 cm   LV E/e' medial:  10.2 LV IVS:        1.10 cm   LV e' lateral:   5.87 cm/s LVOT diam:     2.30 cm   LV E/e' lateral: 9.5 LV SV:         99 LV SV Index:   53 LVOT Area:     4.15 cm  RIGHT VENTRICLE RV S prime:     13.20 cm/s TAPSE (M-mode): 1.8 cm LEFT ATRIUM             Index        RIGHT  ATRIUM           Index LA diam:        3.40 cm 1.82 cm/m   RA Area:     10.40 cm LA Vol (A2C):   54.2 ml 29.04 ml/m  RA Volume:   20.60 ml  11.04 ml/m LA Vol (A4C):   68.8 ml 36.87 ml/m LA Biplane Vol: 65.7 ml 35.21 ml/m  AORTIC VALVE AV Area (Vmax):    4.15 cm AV Area (Vmean):   3.99 cm AV Area (VTI):     4.12 cm AV Vmax:           113.00 cm/s AV Vmean:          73.900 cm/s AV VTI:            0.241 m AV Peak Grad:      5.1 mmHg AV Mean Grad:      3.0 mmHg LVOT Vmax:         113.00 cm/s LVOT Vmean:        71.000 cm/s LVOT VTI:          0.239 m LVOT/AV VTI ratio: 0.99 AI PHT:            533 msec  AORTA Ao Root diam: 3.40 cm MITRAL VALVE               TRICUSPID VALVE MV Area (PHT): 2.50 cm    TR Peak grad:   30.0 mmHg MV Decel Time: 303 msec    TR Vmax:        274.00 cm/s MR Peak grad: 26.6 mmHg MR Vmax:      258.00 cm/s  SHUNTS MV E velocity: 55.50 cm/s  Systemic VTI:  0.24 m MV A velocity: 62.90 cm/s  Systemic Diam: 2.30 cm MV E/A ratio:  0.88 Kirk Ruths MD Electronically signed by Kirk Ruths MD Signature Date/Time: 02/20/2022/3:23:03 PM    Final    CT ANGIO HEAD NECK W WO CM  Result Date: 02/19/2022 CLINICAL DATA:  Right PICA territory infarct. EXAM: CT ANGIOGRAPHY HEAD AND NECK TECHNIQUE: Multidetector CT imaging of the head and neck was performed using the standard protocol during bolus administration of intravenous contrast. Multiplanar CT image reconstructions and MIPs were obtained to evaluate the vascular anatomy. Carotid stenosis measurements (when applicable) are obtained utilizing NASCET criteria, using the distal internal carotid diameter as the  denominator. RADIATION DOSE REDUCTION: This exam was performed according to the departmental dose-optimization program which includes automated exposure control, adjustment of the mA and/or kV according to patient size and/or use of iterative reconstruction technique. CONTRAST:  60m OMNIPAQUE IOHEXOL 350 MG/ML SOLN COMPARISON:  MRI of the brain February 19, 2022. FINDINGS: CT HEAD FINDINGS Brain: Hypodensity within the right inferior cerebellar hemisphere corresponding to infarct described on recent MRI. No new acute infarct identified. Small remote infarct in the left thalamus. No acute hemorrhage, hydrocephalus, extra-axial collection or mass lesion. Vascular: No hyperdense vessel or unexpected calcification. Skull: Normal. Negative for fracture or focal lesion. Sinuses/Orbits: No acute finding. Other: None. Review of the MIP images confirms the above findings CTA NECK FINDINGS Aortic arch: Standard branching. Imaged portion shows no evidence of aneurysm or dissection. Calcified plaques are noted in the aortic arch. No significant stenosis of the major arch vessel origins. Right carotid system: Calcified atherosclerotic plaque is seen in the right carotid bifurcation without hemodynamically significant stenosis. Increased tortuosity of the cervical segment of right ICA. Left carotid system: Calcified atherosclerotic plaque is  seen in the left carotid bifurcation without hemodynamically significant stenosis. Increased tortuosity of the cervical. Vertebral arteries: Calcified atherosclerotic plaque is noted at the origin of the non dominant right vertebral artery resulting in mild stenosis. Calcified atherosclerotic plaque is seen at the origin of the dominant left vertebral artery without hemodynamically significant stenosis. Increased tortuosity of the V2 segment of the left vertebral artery is noted without hemodynamically significant stenosis. Skeleton: Degenerative changes of the cervical spine. No acute or  aggressive process identified. Other neck: Subcentimeter left thyroid lobe nodule for which no follow-up is recommended. Upper chest: Mild pleuropulmonary apical scarring. Review of the MIP images confirms the above findings CTA HEAD FINDINGS Anterior circulation: Mild atherosclerotic plaques in the bilateral carotid siphons without hemodynamically significant stenosis. No significant stenosis, proximal occlusion, aneurysm or vascular malformation. Posterior circulation: Non dominant intracranial right vertebral artery supplying primarily the right PICA which remains patent. The right vertebral artery is severely hypoplastic distal to the right PICA origin. The lung dominant left vertebral artery and left PICA are patent. Hypoplastic vertebrobasilar system related to presence of bilateral fetal PCA. Venous sinuses: As permitted by contrast timing, patent. Anatomic variants: Bilateral fetal PCAs. Review of the MIP images confirms the above findings IMPRESSION: 1. Small hypodense area within the right inferior cerebellar hemisphere corresponding to the right PICA territory infarct described on recent MRI of the brain. No new infarct identified. 2. No intracranial emergency large vessel occlusion identified. 3. Atherosclerotic changes at the origin of a non dominant right vertebral artery resulting in mild stenosis. 4. Mild atherosclerotic changes of the bilateral carotid bifurcation without hemodynamically significant stenosis. Electronically Signed   By: Pedro Earls M.D.   On: 02/19/2022 17:47   MR BRAIN WO CONTRAST  Result Date: 02/19/2022 CLINICAL DATA:  Dizziness EXAM: MRI HEAD WITHOUT CONTRAST TECHNIQUE: Multiplanar, multiecho pulse sequences of the brain and surrounding structures were obtained without intravenous contrast. COMPARISON:  None Available. FINDINGS: Brain: There is diffusion restriction with associated FLAIR signal abnormality in the right cerebellar hemisphere in the PICA  distribution consistent with acute infarct. There is no hemorrhage or mass effect. There is no other evidence of acute infarct. There is no acute intracranial hemorrhage or extra-axial fluid collection. There is moderate global parenchymal volume loss with prominence of the ventricular system and extra-axial CSF spaces. There is a mild burden of chronic small vessel ischemic change in the supratentorial white matter. There is no mass lesion.  There is no mass effect or midline shift. Vascular: Normal flow voids. Skull and upper cervical spine: Normal marrow signal. Sinuses/Orbits: The paranasal sinuses are clear. Bilateral lens implants are in place. The globes and orbits are otherwise unremarkable. Other: None. IMPRESSION: Acute infarct in the right cerebellar hemisphere in the PICA distribution without hemorrhage or mass effect. Electronically Signed   By: Valetta Mole M.D.   On: 02/19/2022 16:23     PHYSICAL EXAM  Temp:  [98.1 F (36.7 C)-99 F (37.2 C)] 98.3 F (36.8 C) (09/08 1559) Pulse Rate:  [64-97] 65 (09/08 1559) Resp:  [15-21] 16 (09/08 1559) BP: (126-164)/(72-98) 147/83 (09/08 1559) SpO2:  [89 %-100 %] 98 % (09/08 1559) Weight:  [71.3 kg-71.4 kg] 71.4 kg (09/08 0500)  General - Well nourished, well developed, in no apparent distress.  Ophthalmologic - fundi not visualized due to noncooperation.  Cardiovascular - Regular rhythm and rate.  Mental Status -  Level of arousal and orientation to time, place, and person were intact. Language including expression, naming,  repetition, comprehension was assessed and found intact. Attention span and concentration were normal. Recent and remote memory were intact. Fund of Knowledge was assessed and was intact.  Cranial Nerves II - XII - II - Visual field intact OU. III, IV, VI - Extraocular movements showed left eye abduction incomplete V - Facial sensation intact bilaterally. VII - Facial movement intact bilaterally. VIII - Hearing  & vestibular intact bilaterally. X - Palate elevates symmetrically. XI - Chin turning & shoulder shrug intact bilaterally. XII - Tongue protrusion intact.  Motor Strength - The patient's strength was normal in all extremities and pronator drift was absent.  Bulk was normal and fasciculations were absent.   Motor Tone - Muscle tone was assessed at the neck and appendages and was normal.  Reflexes - The patient's reflexes were symmetrical in all extremities and he had no pathological reflexes.  Sensory - Light touch, temperature/pinprick were assessed and were symmetrical.    Coordination - The patient had normal movements in the hands and feet with no ataxia or dysmetria although mildly slow.  Tremor was absent.  Gait and Station - deferred.   ASSESSMENT/PLAN Cory Zavala is a 88 y.o. male with history of hypertension, BPH, LBP, hematuria in 2015 admitted for diplopia, dizziness and imbalance on walking. No tPA given due to outside window.    Stroke:  right cerebellum infarct likely secondary to small vessel disease source CT showed right cerebellum subacute infarct with right cerebellum old small infarct MRI confirmed right cerebellum subacute and chronic infarcts CTA head and neck unremarkable 2D Echo EF 60 to 60% LDL 84 HgbA1c 5.9 SCDs for VTE prophylaxis No antithrombotic prior to admission, now on aspirin 81 mg daily and clopidogrel 75 mg daily DAPT for 3 weeks and then aspirin alone. Patient counseled to be compliant with his antithrombotic medications Ongoing aggressive stroke risk factor management Therapy recommendations: Outpatient PT/OT Disposition: Home today  Hypertension Stable Long term BP goal normotensive  Hyperlipidemia Home meds: None LDL 84, goal < 70 Now on Crestor 10, high intensity statin not indicated given advanced age and LDL not far from goal Continue statin at discharge  Other Stroke Risk Factors Advanced age  Other Active  Problems BPH Lower back pain Hematuria in 2015, etiology unclear  Hospital day # 0  Neurology will sign off. Please call with questions. Pt will follow up with stroke clinic NP at [facility] in about 4 weeks. Thanks for the consult.   Rosalin Hawking, MD PhD Stroke Neurology 02/20/2022 4:58 PM    To contact Stroke Continuity provider, please refer to [email or web address] After hours, contact General Neurology

## 2022-02-20 NOTE — Hospital Course (Addendum)
Cory Zavala is a 88 y.o. male with past medical history significant for essential hypertension was admitted to hospital as a transfer from [facility] emergency department.  Patient had initially presented with acute onset dizziness which remained persistent so he decided to come to the hospital.  In the ED vitals were stable.  Labs were unremarkable.  EKG showed normal sinus rhythm with right bundle branch block. By the time the patient presented [place] emergency department, he is already outside of the window for consideration for tPA administration.  MRI of the brain was performed which showed acute infarct in the right cerebellar hemisphere and PICA distribution, without corresponding evidence of hemorrhage or mass effect.   CTA head and neck showed no evidence of emergent large vessel occlusion.  Neurology was consulted and patient was admitted hospital for further evaluation and treatment.  Assessment and plan.  Principal Problem:   Acute ischemic stroke [facility]) Active Problems:   Essential hypertension   GERD (gastroesophageal reflux disease)   Cerebral infarction involving right cerebellar artery (HCC)   Hypokalemia   Acute ischemic stroke:  MRI evidence of acute infarct in the right cerebral hemisphere.  CTA head and neck without emergent occlusion.  Not a candidate for tPA.  Neurology on board.  Received aspirin initially.  Allow permissive hypertension for up to 48 hours.  Seen by speech therapy.  PT OT has seen the patient and recommend outpatient PT.  Check 2D echocardiogram pending.  Hemoglobin A1c of 5.9. Lipid panel with LDL of 84.  Mild hypokalemia.  Potassium 3.4 today.  We will replace.    Essential Hypertension:  Patient is on losartan, Norvasc at home.  Holding antihypertensives for now for permissive hypertension.Marland Kitchen     GERD: continue  PPI.

## 2022-02-20 NOTE — Evaluation (Signed)
Physical Therapy Evaluation Patient Details Name: Cory Zavala MRN: [id number] DOB: 03/30/1931 Today's Date: 02/20/2022  History of Present Illness  DAVIUS GOUDEAU is a 88 y.o. male with medical history significant for essential hypertension, who is admitted to [facility] on 02/19/2022 from [place] with c/o dizziness and found to have acute ischemic R cerebellar stroke.  Clinical Impression  Patient presents with decreased mobility due to deficits listed in PT problem list.  Currently minguard to S ambulating in hall without assistive device.  He was able to negotiate steps with railing and pick up item from flor with S.  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  Feel he will benefit from skilled PT in the acute setting to allow return home with family support and follow up outpatient rehab.     Recommendations for follow up therapy are one component of a multi-disciplinary discharge planning process, led by the attending physician.  Recommendations may be updated based on patient status, additional functional criteria and insurance authorization.  Follow Up Recommendations Outpatient PT      Assistance Recommended at Discharge Intermittent Supervision/Assistance  Patient can return home with the following  A little help with walking and/or transfers;A little help with bathing/dressing/bathroom;Assistance with cooking/housework;Help with stairs or ramp for entrance;Assist for transportation    Equipment Recommendations None recommended by PT  Recommendations for Other Services       Functional Status Assessment Patient has had a recent decline in their functional status and demonstrates the ability to make significant improvements in function in a reasonable and predictable amount of time.     Precautions / Restrictions Precautions Precautions: Fall Restrictions Weight Bearing Restrictions: No      Mobility  Bed  Mobility Overal bed mobility: Modified Independent             General bed mobility comments: for sit to supine, in chair upon entry    Transfers Overall transfer level: Needs assistance Equipment used: None Transfers: Sit to/from Stand Sit to Stand: Supervision           General transfer comment: assist for safety    Ambulation/Gait Ambulation/Gait assistance: Min guard Gait Distance (Feet): 200 Feet Assistive device: None Gait Pattern/deviations: Step-to pattern, Step-through pattern, Decreased stride length, Wide base of support       General Gait Details: mild instability noted, minguard for safety; walked with head turns/nods with no real change in gait, stepping over box on the floor with minguard and pt performing with increased time  Stairs Stairs: Yes Stairs assistance: Supervision Stair Management: One rail Right Number of Stairs: 2 General stair comments: good technique  Wheelchair Mobility    Modified Rankin (Stroke Patients Only) Modified Rankin (Stroke Patients Only) Pre-Morbid Rankin Score: No significant disability Modified Rankin: Moderately severe disability     Balance Overall balance assessment: Needs assistance   Sitting balance-Leahy Scale: Good       Standing balance-Leahy Scale: Good Standing balance comment: able to pick up item from floor, able to step over item, some instability with increased time for challenging tasks.                             Pertinent Vitals/Pain Pain Assessment Pain Assessment: No/denies pain    Home Living Family/patient expects to be discharged to:: Private residence Living Arrangements: Spouse/significant other Available Help at Discharge: Family Type of Home: [facility]  Access: Stairs to enter   [employer] of Steps: 1   Home Layout: Two level;Able to live on main level with bedroom/bathroom Home Equipment: Rolling Walker (2 wheels);Shower seat Additional  Comments: equipment his wife used in the past    Prior Function Prior Level of Function : Independent/Modified Independent             Mobility Comments: No AD ADLs Comments: completing ADL/IADL's and driving; also mowing the grass; does report he is very careful so he does not fall     Hand Dominance   Dominant Hand: Right    Extremity/Trunk Assessment   Upper Extremity Assessment Upper Extremity Assessment: Overall WFL for tasks assessed (Shoulder limitations at baseline)    Lower Extremity Assessment Lower Extremity Assessment: Defer to PT evaluation    Cervical / Trunk Assessment Cervical / Trunk Assessment: Kyphotic  Communication   Communication: No difficulties  Cognition Arousal/Alertness: Awake/alert Behavior During Therapy: WFL for tasks assessed/performed Overall Cognitive Status: Within Functional Limits for tasks assessed                                          General Comments General comments (skin integrity, edema, etc.): Educating family on vision and taping of glasses as well as binocular vision.    Exercises     Assessment/Plan    PT Assessment Patient needs continued PT services  PT Problem List Decreased balance;Decreased knowledge of use of DME;Decreased cognition;Decreased coordination       PT Treatment Interventions DME instruction;Therapeutic exercise;Gait training;Balance training;Stair training;Functional mobility training;Therapeutic activities;Patient/family education    PT Goals (Current goals can be found in the Care Plan section)  Acute Rehab PT Goals Patient Stated Goal: to return to independent PT Goal Formulation: With patient/family Time For Goal Achievement: 03/06/22 Potential to Achieve Goals: Good    Frequency Min 4X/week     Co-evaluation               AM-PAC PT "6 Clicks" Mobility  Outcome Measure Help needed turning from your back to your side while in a flat bed without using  bedrails?: None Help needed moving from lying on your back to sitting on the side of a flat bed without using bedrails?: None Help needed moving to and from a bed to a chair (including a wheelchair)?: A Little Help needed standing up from a chair using your arms (e.g., wheelchair or bedside chair)?: A Little Help needed to walk in hospital room?: A Little Help needed climbing 3-5 steps with a railing? : A Little 6 Click Score: 20    End of Session Equipment Utilized During Treatment: Gait belt Activity Tolerance: Patient tolerated treatment well Patient left: in bed;with call bell/phone within reach;with family/visitor present   PT Visit Diagnosis: Other abnormalities of gait and mobility (R26.89)    Time: 1006-1030 PT Time Calculation (min) (ACUTE ONLY): 24 min   Charges:   PT Evaluation $PT Eval Moderate Complexity: 1 Mod PT Treatments $Gait Training: 8-22 mins        Magda Kiel, PT Acute Rehabilitation Services Office:[phone] 02/20/2022   Reginia Naas 02/20/2022, 1:51 PM

## 2022-02-20 NOTE — Discharge Summary (Signed)
Physician Discharge Summary  Cory Zavala CVE:[id number] DOB: 1930/07/26 DOA: 02/19/2022  PCP: Colon Branch, MD  Admit date: 02/19/2022 Discharge date: 02/20/2022  Admitted From: Home  Discharge disposition: Home   Recommendations for Outpatient Follow-Up:   Follow up with your primary care provider in one week.  Check CBC, BMP, magnesium in the next visit Follow-up with [facility]y Associates as outpatient.  Renal referral has been made.  Discharge Diagnosis:   Principal Problem:   Acute ischemic stroke [facility]) Active Problems:   Essential hypertension   GERD (gastroesophageal reflux disease)   Cerebral infarction involving right cerebellar artery (HCC)   Hypokalemia   Discharge Condition: Improved.  Diet recommendation: Low sodium, heart healthy.    Wound care: None.  Code status: Full.  History of Present Illness:   Cory Zavala is a 88 y.o. male with past medical history significant for essential hypertension was admitted to hospital as a transfer from [facility] emergency department.  Patient had initially presented with acute onset dizziness which remained persistent so he decided to come to the hospital.  In the ED, vitals were stable.  Labs were unremarkable.  EKG showed normal sinus rhythm with right bundle branch block. By the time the patient presented [place] emergency department, he is already outside of the window for consideration for tPA administration.  MRI of the brain was performed which showed acute infarct in the right cerebellar hemisphere and PICA distribution, without corresponding evidence of hemorrhage or mass effect.   CTA head and neck showed no evidence of emergent large vessel occlusion.  Neurology was consulted and patient was admitted hospital for further evaluation and treatment.   Hospital Course:   Following conditions were addressed during hospitalization as listed below,  Acute ischemic stroke:  MRI evidence of acute infarct in  the right cerebral hemisphere.  CTA head and neck without emergent occlusion.  Patient was not a candidate for tPA.  Neurology was consulted and recommend aspirin and Plavix for 3 weeks followed by aspirin alone on discharge.  Crestor has been initiated during hospitalization.  Patient was advised to hold Mobic while taking aspirin and Plavix.   PT, OT has seen the patient and recommend outpatient PT.  2D echocardiogram with preserved LV function.  Hemoglobin A1c of 5.9. Lipid panel with LDL of 84.  We will follow-up with [facility] neurology Associates as outpatient.   Mild hypokalemia.  Potassium 3.4 today.  Been replaced.  Recommend following up with PCP in 1 week and checking blood work.    Essential Hypertension:  Patient is on losartan, Norvasc at home.  Resume after discharge.  Disposition.  At this time, patient is stable for disposition home with outpatient PCP and neurology follow-up.  Communicated with the patient's spouse and son prior to discharge.  Medical Consultants:   Neurology  Procedures:    None Subjective:   Today, patient was seen and examined at bedside.  Complains of blurred vision.  Denies overt dizziness or vertigo at this time.  Discharge Exam:   Vitals:   02/20/22 1221 02/20/22 1559  BP: 126/89 (!) 147/83  Pulse: 73 65  Resp: 16 16  Temp: 98.2 F (36.8 C) 98.3 F (36.8 C)  SpO2: 99% 98%   Vitals:   02/20/22 0500 02/20/22 0725 02/20/22 1221 02/20/22 1559  BP:  (!) 149/87 126/89 (!) 147/83  Pulse:  65 73 65  Resp:  '16 16 16  '$ Temp:  98.4 F (36.9 C) 98.2 F (36.8 C) 98.3  F (36.8 C)  TempSrc:  Oral Oral Oral  SpO2:  96% 99% 98%  Weight: 71.4 kg     Height:      General:  Average built, not in obvious distress alert awake and Communicative, elderly male, HENT:   No scleral pallor or icterus noted. Oral mucosa is moist.  Chest:  Clear breath sounds.  Diminished breath sounds bilaterally. No crackles or wheezes.  CVS: S1 &S2 heard. No murmur.   Regular rate and rhythm. Abdomen: Soft, nontender, nondistended.  Bowel sounds are heard.   Extremities: No cyanosis, clubbing or edema.  Peripheral pulses are palpable. Psych: Alert, awake and Communicative, oriented CNS:  No cranial nerve deficits.  Power equal in all extremities.  Negative cerebellar signs. Skin: Warm and dry.  No rashes noted.  The results of significant diagnostics from this hospitalization (including imaging, microbiology, ancillary and laboratory) are listed below for reference.     Diagnostic Studies:   ECHOCARDIOGRAM COMPLETE  Result Date: 02/20/2022    ECHOCARDIOGRAM REPORT   Patient Name:   Cory Zavala Date of Exam: 02/20/2022 Medical Rec #:  [id number]     Height:       69.0 in Accession #:    [id number]    Weight:       157.4 lb Date of Birth:  08/29/1930      BSA:          1.866 m Patient Age:    27 years      BP:           149/87 mmHg Patient Gender: M             HR:           64 bpm. Exam Location:  Inpatient Procedure: 2D Echo, Cardiac Doppler and Color Doppler Indications:    Stroke  History:        Patient has no prior history of Echocardiogram examinations.                 Risk Factors:Hypertension.  Sonographer:    Memory Argue Referring Phys: [id number] [place]  1. Left ventricular ejection fraction, by estimation, is 60 to 65%. The left ventricle has normal function. The left ventricle has no regional wall motion abnormalities. There is mild left ventricular hypertrophy of the basal-septal segment. Left ventricular diastolic parameters are consistent with Grade I diastolic dysfunction (impaired relaxation).  2. Right ventricular systolic function is normal. The right ventricular size is normal. There is mildly elevated pulmonary artery systolic pressure.  3. Left atrial size was mildly dilated.  4. The mitral valve is normal in structure. Mild mitral valve regurgitation. No evidence of mitral stenosis.  5. Tricuspid valve regurgitation is  moderate.  6. The aortic valve is tricuspid. Aortic valve regurgitation is mild. Aortic valve sclerosis is present, with no evidence of aortic valve stenosis.  7. The inferior vena cava is dilated in size with >50% respiratory variability, suggesting right atrial pressure of 8 mmHg. Comparison(s): No prior Echocardiogram. FINDINGS  Left Ventricle: Left ventricular ejection fraction, by estimation, is 60 to 65%. The left ventricle has normal function. The left ventricle has no regional wall motion abnormalities. The left ventricular internal cavity size was normal in size. There is  mild left ventricular hypertrophy of the basal-septal segment. Left ventricular diastolic parameters are consistent with Grade I diastolic dysfunction (impaired relaxation). Right Ventricle: The right ventricular size is normal. Right ventricular systolic function is normal. There is mildly  elevated pulmonary artery systolic pressure. The tricuspid regurgitant velocity is 2.74 m/s, and with an assumed right atrial pressure of 8 mmHg, the estimated right ventricular systolic pressure is 93.8 mmHg. Left Atrium: Left atrial size was mildly dilated. Right Atrium: Right atrial size was normal in size. Pericardium: There is no evidence of pericardial effusion. Mitral Valve: The mitral valve is normal in structure. Mild mitral valve regurgitation. No evidence of mitral valve stenosis. Tricuspid Valve: The tricuspid valve is normal in structure. Tricuspid valve regurgitation is moderate . No evidence of tricuspid stenosis. Aortic Valve: The aortic valve is tricuspid. Aortic valve regurgitation is mild. Aortic regurgitation PHT measures 533 msec. Aortic valve sclerosis is present, with no evidence of aortic valve stenosis. Aortic valve mean gradient measures 3.0 mmHg. Aortic valve peak gradient measures 5.1 mmHg. Aortic valve area, by VTI measures 4.12 cm. Pulmonic Valve: The pulmonic valve was normal in structure. Pulmonic valve regurgitation is  trivial. No evidence of pulmonic stenosis. Aorta: The aortic root is normal in size and structure. Venous: The inferior vena cava is dilated in size with greater than 50% respiratory variability, suggesting right atrial pressure of 8 mmHg. IAS/Shunts: No atrial level shunt detected by color flow Doppler.  LEFT VENTRICLE PLAX 2D LVIDd:         5.00 cm   Diastology LVIDs:         3.50 cm   LV e' medial:    5.44 cm/s LV PW:         0.90 cm   LV E/e' medial:  10.2 LV IVS:        1.10 cm   LV e' lateral:   5.87 cm/s LVOT diam:     2.30 cm   LV E/e' lateral: 9.5 LV SV:         99 LV SV Index:   53 LVOT Area:     4.15 cm  RIGHT VENTRICLE RV S prime:     13.20 cm/s TAPSE (M-mode): 1.8 cm LEFT ATRIUM             Index        RIGHT ATRIUM           Index LA diam:        3.40 cm 1.82 cm/m   RA Area:     10.40 cm LA Vol (A2C):   54.2 ml 29.04 ml/m  RA Volume:   20.60 ml  11.04 ml/m LA Vol (A4C):   68.8 ml 36.87 ml/m LA Biplane Vol: 65.7 ml 35.21 ml/m  AORTIC VALVE AV Area (Vmax):    4.15 cm AV Area (Vmean):   3.99 cm AV Area (VTI):     4.12 cm AV Vmax:           113.00 cm/s AV Vmean:          73.900 cm/s AV VTI:            0.241 m AV Peak Grad:      5.1 mmHg AV Mean Grad:      3.0 mmHg LVOT Vmax:         113.00 cm/s LVOT Vmean:        71.000 cm/s LVOT VTI:          0.239 m LVOT/AV VTI ratio: 0.99 AI PHT:            533 msec  AORTA Ao Root diam: 3.40 cm MITRAL VALVE  TRICUSPID VALVE MV Area (PHT): 2.50 cm    TR Peak grad:   30.0 mmHg MV Decel Time: 303 msec    TR Vmax:        274.00 cm/s MR Peak grad: 26.6 mmHg MR Vmax:      258.00 cm/s  SHUNTS MV E velocity: 55.50 cm/s  Systemic VTI:  0.24 m MV A velocity: 62.90 cm/s  Systemic Diam: 2.30 cm MV E/A ratio:  0.88 Kirk Ruths MD Electronically signed by Kirk Ruths MD Signature Date/Time: 02/20/2022/3:23:03 PM    Final    CT ANGIO HEAD NECK W WO CM  Result Date: 02/19/2022 CLINICAL DATA:  Right PICA territory infarct. EXAM: CT ANGIOGRAPHY HEAD AND  NECK TECHNIQUE: Multidetector CT imaging of the head and neck was performed using the standard protocol during bolus administration of intravenous contrast. Multiplanar CT image reconstructions and MIPs were obtained to evaluate the vascular anatomy. Carotid stenosis measurements (when applicable) are obtained utilizing NASCET criteria, using the distal internal carotid diameter as the denominator. RADIATION DOSE REDUCTION: This exam was performed according to the departmental dose-optimization program which includes automated exposure control, adjustment of the mA and/or kV according to patient size and/or use of iterative reconstruction technique. CONTRAST:  10m OMNIPAQUE IOHEXOL 350 MG/ML SOLN COMPARISON:  MRI of the brain February 19, 2022. FINDINGS: CT HEAD FINDINGS Brain: Hypodensity within the right inferior cerebellar hemisphere corresponding to infarct described on recent MRI. No new acute infarct identified. Small remote infarct in the left thalamus. No acute hemorrhage, hydrocephalus, extra-axial collection or mass lesion. Vascular: No hyperdense vessel or unexpected calcification. Skull: Normal. Negative for fracture or focal lesion. Sinuses/Orbits: No acute finding. Other: None. Review of the MIP images confirms the above findings CTA NECK FINDINGS Aortic arch: Standard branching. Imaged portion shows no evidence of aneurysm or dissection. Calcified plaques are noted in the aortic arch. No significant stenosis of the major arch vessel origins. Right carotid system: Calcified atherosclerotic plaque is seen in the right carotid bifurcation without hemodynamically significant stenosis. Increased tortuosity of the cervical segment of right ICA. Left carotid system: Calcified atherosclerotic plaque is seen in the left carotid bifurcation without hemodynamically significant stenosis. Increased tortuosity of the cervical. Vertebral arteries: Calcified atherosclerotic plaque is noted at the origin of the non  dominant right vertebral artery resulting in mild stenosis. Calcified atherosclerotic plaque is seen at the origin of the dominant left vertebral artery without hemodynamically significant stenosis. Increased tortuosity of the V2 segment of the left vertebral artery is noted without hemodynamically significant stenosis. Skeleton: Degenerative changes of the cervical spine. No acute or aggressive process identified. Other neck: Subcentimeter left thyroid lobe nodule for which no follow-up is recommended. Upper chest: Mild pleuropulmonary apical scarring. Review of the MIP images confirms the above findings CTA HEAD FINDINGS Anterior circulation: Mild atherosclerotic plaques in the bilateral carotid siphons without hemodynamically significant stenosis. No significant stenosis, proximal occlusion, aneurysm or vascular malformation. Posterior circulation: Non dominant intracranial right vertebral artery supplying primarily the right PICA which remains patent. The right vertebral artery is severely hypoplastic distal to the right PICA origin. The lung dominant left vertebral artery and left PICA are patent. Hypoplastic vertebrobasilar system related to presence of bilateral fetal PCA. Venous sinuses: As permitted by contrast timing, patent. Anatomic variants: Bilateral fetal PCAs. Review of the MIP images confirms the above findings IMPRESSION: 1. Small hypodense area within the right inferior cerebellar hemisphere corresponding to the right PICA territory infarct described on recent MRI of the brain. No  new infarct identified. 2. No intracranial emergency large vessel occlusion identified. 3. Atherosclerotic changes at the origin of a non dominant right vertebral artery resulting in mild stenosis. 4. Mild atherosclerotic changes of the bilateral carotid bifurcation without hemodynamically significant stenosis. Electronically Signed   By: Pedro Earls M.D.   On: 02/19/2022 17:47   MR BRAIN WO  CONTRAST  Result Date: 02/19/2022 CLINICAL DATA:  Dizziness EXAM: MRI HEAD WITHOUT CONTRAST TECHNIQUE: Multiplanar, multiecho pulse sequences of the brain and surrounding structures were obtained without intravenous contrast. COMPARISON:  None Available. FINDINGS: Brain: There is diffusion restriction with associated FLAIR signal abnormality in the right cerebellar hemisphere in the PICA distribution consistent with acute infarct. There is no hemorrhage or mass effect. There is no other evidence of acute infarct. There is no acute intracranial hemorrhage or extra-axial fluid collection. There is moderate global parenchymal volume loss with prominence of the ventricular system and extra-axial CSF spaces. There is a mild burden of chronic small vessel ischemic change in the supratentorial white matter. There is no mass lesion.  There is no mass effect or midline shift. Vascular: Normal flow voids. Skull and upper cervical spine: Normal marrow signal. Sinuses/Orbits: The paranasal sinuses are clear. Bilateral lens implants are in place. The globes and orbits are otherwise unremarkable. Other: None. IMPRESSION: Acute infarct in the right cerebellar hemisphere in the PICA distribution without hemorrhage or mass effect. Electronically Signed   By: Valetta Mole M.D.   On: 02/19/2022 16:23     Labs:   Basic Metabolic Panel: Recent Labs  Lab 02/19/22 1530 02/20/22 0341  NA 141 141  K 3.9 3.4*  CL 104 109  CO2 25 23  GLUCOSE 142* 108*  BUN 22 18  CREATININE 1.02 1.09  CALCIUM 9.5 9.2  MG  --  1.8   GFR Estimated Creatinine Clearance: 45.9 mL/min (by C-G formula based on SCr of 1.09 mg/dL). Liver Function Tests: Recent Labs  Lab 02/20/22 0341  AST 18  ALT 14  ALKPHOS 56  BILITOT 1.1  PROT 5.8*  ALBUMIN 3.6   No results for input(s): "LIPASE", "AMYLASE" in the last 168 hours. No results for input(s): "AMMONIA" in the last 168 hours. Coagulation profile No results for input(s): "INR",  "PROTIME" in the last 168 hours.  CBC: Recent Labs  Lab 02/19/22 1530 02/20/22 0341  WBC 8.2 9.9  NEUTROABS  --  5.9  HGB 14.8 13.7  HCT 42.7 39.0  MCV 93.8 92.2  PLT 193 170   Cardiac Enzymes: No results for input(s): "CKTOTAL", "CKMB", "CKMBINDEX", "TROPONINI" in the last 168 hours. BNP: Invalid input(s): "POCBNP" CBG: No results for input(s): "GLUCAP" in the last 168 hours. D-Dimer No results for input(s): "DDIMER" in the last 72 hours. Hgb A1c Recent Labs    02/20/22 0341  HGBA1C 5.9*   Lipid Profile Recent Labs    02/20/22 0341  CHOL 146  HDL 52  LDLCALC 84  TRIG 52  CHOLHDL 2.8   Thyroid function studies No results for input(s): "TSH", "T4TOTAL", "T3FREE", "THYROIDAB" in the last 72 hours.  Invalid input(s): "FREET3" Anemia work up No results for input(s): "VITAMINB12", "FOLATE", "FERRITIN", "TIBC", "IRON", "RETICCTPCT" in the last 72 hours. Microbiology No results found for this or any previous visit (from the past 240 hour(s)).   Discharge Instructions:   Discharge Instructions     Ambulatory referral to Neurology   Complete by: As directed    Follow up with stroke clinic NP Venancio Poisson or  Cecille Rubin, if both not available, consider Zachery Dauer, or [place]) at [facility] in about 4 weeks. Thanks.   Ambulatory referral to Occupational Therapy   Complete by: As directed    Ambulatory referral to Physical Therapy   Complete by: As directed    Diet - low sodium heart healthy   Complete by: As directed    Discharge instructions   Complete by: As directed    Follow up with your primary care physician in 1-2 weeks. Follow up with [facility] neurology in 4-6 weeks (internal referral done).  Seek medical attention for worsening symptoms.  Take medications as prescribed.  Do not take Mobic while taking aspirin and Plavix.   Increase activity slowly   Complete by: As directed       Allergies as of 02/20/2022       Reactions   Bee Venom  Anaphylaxis        Medication List     STOP taking these medications    hydrocortisone 25 MG suppository Commonly known as: ANUSOL-HC   meloxicam 15 MG tablet Commonly known as: MOBIC   meloxicam 7.5 MG tablet Commonly known as: MOBIC       TAKE these medications    amLODipine 5 MG tablet Commonly known as: NORVASC Take 1 tablet (5 mg total) by mouth daily.   aspirin EC 81 MG tablet Take 1 tablet (81 mg total) by mouth daily. Swallow whole. Start taking on: February 21, 2022   clopidogrel 75 MG tablet Commonly known as: PLAVIX Take 1 tablet (75 mg total) by mouth daily for 21 days. Start taking on: February 21, 2022   D3 ADULT PO Take 1 tablet by mouth daily at 6 (six) AM.   EPINEPHrine 0.3 mg/0.3 mL Soaj injection Commonly known as: EPI-PEN Inject 0.3 mg into the muscle as needed for anaphylaxis. INJECT 0.3MLS(CONTENTS OF 1 SYRINGE) INTO THE MUSCLE AS DIRECTED What changed:  when to take this additional instructions   loratadine 10 MG tablet Commonly known as: Claritin Take 1-2 tablets 1-2 times per day What changed:  how much to take how to take this when to take this reasons to take this additional instructions   losartan 50 MG tablet Commonly known as: COZAAR TAKE 1 TABLET BY MOUTH EVERY DAY   multivitamin tablet Take 1 tablet by mouth daily.   OMEGA-3 FATTY ACIDS PO Take 1 tablet by mouth daily.   OSTEO ADVANCE PO Take 4 tablets by mouth daily. OSTEO GOLD (BRAND)   pantoprazole 40 MG tablet Commonly known as: PROTONIX TAKE 1 TABLET (40 MG TOTAL) BY MOUTH TWICE A DAY BEFORE MEALS What changed: See the new instructions.   PreserVision AREDS Caps Take 1 capsule by mouth daily.   rosuvastatin 10 MG tablet Commonly known as: CRESTOR Take 1 tablet (10 mg total) by mouth daily. Start taking on: February 21, 2022   vitamin B-12 500 MCG tablet Commonly known as: CYANOCOBALAMIN Take 500 mcg by mouth daily.        Follow-up  Information     [facility]. Schedule an appointment as soon as possible for a visit in 1 week(s).   Specialty: Rehabilitation Contact information: [place] [address] [place] [place] [id number] [place] [phone]        [facility]. Schedule an appointment as soon as possible for a visit in 1 month(s).   Specialty: Neurology Why: stroke clinic Contact information: [place]  [phone]                 Time coordinating discharge: 39 minutes  Signed:  Leydy Worthey  [facility] 02/20/2022, 5:03 PM

## 2022-02-20 NOTE — Evaluation (Signed)
Speech Language Pathology Evaluation Patient Details Name: Cory Zavala MRN: [id number] DOB: 1931/03/24 Today's Date: 02/20/2022 Time: 4098-1191 SLP Time Calculation (min) (ACUTE ONLY): 18 min  Problem List:  Patient Active Problem List   Diagnosis Date Noted   Acute ischemic stroke ([place]) 02/20/2022   Cerebral infarction involving right cerebellar artery ([place]) 02/19/2022   Hemorrhoids 07/20/2017   Seasonal allergies 09/30/2015   PCP NOTES >>>> 05/15/2015   Gross hematuria 08/09/2014   Hyperglycemia 08/07/2014   Osteoporosis 06/02/2013   GERD (gastroesophageal reflux disease) 09/15/2012   Chest pain 09/15/2012   Back pain 02/12/2012   Annual physical exam 09/01/2011   Erectile dysfunction 09/01/2011   DEGENERATIVE JOINT DISEASE 05/20/2009   PSA, INCREASED 05/20/2009   Essential hypertension 07/25/2007   BENIGN PROSTATIC HYPERTROPHY 07/25/2007   Past Medical History:  Past Medical History:  Diagnosis Date   Back pain, chronic    BPH (benign prostatic hypertrophy)    Diverticulosis of colon    LEFT   ED (erectile dysfunction)    GERD (gastroesophageal reflux disease) 09/15/2012   Gross hematuria    2015   HTN (hypertension)    Nocturia    Normal cardiac stress test    PER CARDIOLOGIST NOTE, DR NISHAN, APPROX.  1994   Osteoporosis    RBBB    Past Surgical History:  Past Surgical History:  Procedure Laterality Date   CATARACT EXTRACTION W/ INTRAOCULAR LENS  IMPLANT, BILATERAL  2010   CYSTOSCOPY WITH RETROGRADE PYELOGRAM, URETEROSCOPY AND STENT PLACEMENT Right 12/25/2013   Procedure: CYSTOSCOPY WITH RIGHT RETROGRADE PYELOGRAM, RIGHT URETEROSCOPY  BIOPSY AND STENT: BLADDER BIOPSY;  Surgeon: Claybon Jabs, MD;  Location: [facility];  Service: Urology;  Laterality: Right;   VERTEBROPLASTY  2001   HPI:  Cory Zavala is a 88 y.o. male with medical history significant for essential hypertension, who is admitted to [facility] on 02/19/2022 by way of  transfer from [facility] emergency department with acute ischemic stroke, right cerebellar,  after presenting from home to the latter facility complaining of dizziness.   Assessment / Plan / Recommendation Clinical Impression  Cognitive-linguistic status functional at this time. Subtle impairements in the areas of short term memory and advanced mathmatical reasoning noted, both improved with independent use of compensatory strategies. Patient with c/o poor sleep since admission which also may be contributing. Visitor present and reports patient appears largely at baseline. Cognitive deficits not common but possible with cerebellar CVA. No SLP f/u indicated at this time however OP SLP services are available if deficits persist/noted after d/c.    SLP Assessment  SLP Recommendation/Assessment: Patient does not need any further Speech [place] [facility] SLP Visit Diagnosis: Cognitive communication deficit (R41.841)    Recommendations for follow up therapy are one component of a multi-disciplinary discharge planning process, led by the attending physician.  Recommendations may be updated based on patient status, additional functional criteria and insurance authorization.                   SLP Evaluation Cognition  Overall Cognitive Status: Within Functional Limits for tasks assessed       Comprehension  Auditory Comprehension Overall Auditory Comprehension: Appears within functional limits for tasks assessed Visual Recognition/Discrimination Discrimination: Within Function Limits Reading Comprehension Reading Status: Unable to assess (comment) (glasses unavailable)    Expression Expression Primary Mode of Expression: Verbal Verbal Expression Overall Verbal Expression: Appears within functional limits for tasks assessed   Oral / Motor  Oral Motor/Sensory  Function Overall Oral Motor/Sensory Function: Within functional limits Motor Speech Overall Motor Speech: Appears within  functional limits for tasks assessed           Gabriel Rainwater MA, CCC-SLP  Eisen Robenson Meryl 02/20/2022, 9:04 AM

## 2022-02-20 NOTE — Progress Notes (Addendum)
PROGRESS NOTE    Cory Zavala  EAV:[id number] DOB: 03-05-1931 DOA: 02/19/2022 PCP: Colon Branch, MD    Brief Narrative:  Cory Zavala is a 88 y.o. male with past medical history significant for essential hypertension was admitted to hospital as a transfer from [facility] emergency department.  Patient had initially presented with acute onset dizziness which remained persistent so he decided to come to the hospital.  In the ED vitals were stable.  Labs were unremarkable.  EKG showed normal sinus rhythm with right bundle branch block. By the time the patient presented [place] emergency department, he is already outside of the window for consideration for tPA administration.  MRI of the brain was performed which showed acute infarct in the right cerebellar hemisphere and PICA distribution, without corresponding evidence of hemorrhage or mass effect.   CTA head and neck showed no evidence of emergent large vessel occlusion.  Neurology was consulted and patient was admitted hospital for further evaluation and treatment.  Assessment and plan.  Principal Problem:   Acute ischemic stroke [facility]) Active Problems:   Essential hypertension   GERD (gastroesophageal reflux disease)   Cerebral infarction involving right cerebellar artery (HCC)   Hypokalemia   Acute ischemic stroke:  MRI evidence of acute infarct in the right cerebral hemisphere.  CTA head and neck without emergent occlusion.  Not a candidate for tPA.  Neurology on board.  Currently on aspirin and Plavix.  Allow permissive hypertension for up to 48 hours.  Seen by speech therapy.  PT OT has seen the patient and recommend outpatient PT.  Check 2D echocardiogram pending.  Hemoglobin A1c of 5.9. Lipid panel with LDL of 84.  Follow neurology recommendations.  Mild hypokalemia.  Potassium 3.4 today.  We will replace.  Check levels in AM.    Essential Hypertension:  Patient is on losartan, Norvasc at home.  Holding antihypertensives for now for  permissive hypertension.Marland Kitchen     GERD: continue  PPI.        DVT prophylaxis: SCDs Start: 02/19/22 2108   Code Status:     Code Status: Full Code  Disposition: Home  Status is: Observation  The patient will require care spanning > 2 midnights and should be moved to inpatient because: Stroke , PT evaluation, neurology follow-up   Family Communication: Spoke with the patient's son at bedside.  Consultants:  Neurology  Procedures:  None  Antimicrobials:  None  Anti-infectives (From admission, onward)    None       Subjective: Today, patient was seen and examined at bedside.  Complains of blurred vision.  Denies overt dizziness or vertigo at this time.  Objective: Vitals:   02/20/22 0400 02/20/22 0500 02/20/22 0725 02/20/22 1221  BP: (!) 140/73  (!) 149/87 126/89  Pulse:   65 73  Resp: '16  16 16  '$ Temp: 99 F (37.2 C)  98.4 F (36.9 C) 98.2 F (36.8 C)  TempSrc: Oral  Oral Oral  SpO2: 96%  96% 99%  Weight:  71.4 kg    Height:        Intake/Output Summary (Last 24 hours) at 02/20/2022 1427 Last data filed at 02/20/2022 0625 Gross per 24 hour  Intake 60 ml  Output 350 ml  Net -290 ml   Filed Weights   02/19/22 2042 02/20/22 0500  Weight: 71.3 kg 71.4 kg    Physical Examination: Body mass index is 23.25 kg/m.  General:  Average built, not in obvious distress alert awake and Communicative,  elderly male, HENT:   No scleral pallor or icterus noted. Oral mucosa is moist.  Chest:  Clear breath sounds.  Diminished breath sounds bilaterally. No crackles or wheezes.  CVS: S1 &S2 heard. No murmur.  Regular rate and rhythm. Abdomen: Soft, nontender, nondistended.  Bowel sounds are heard.   Extremities: No cyanosis, clubbing or edema.  Peripheral pulses are palpable. Psych: Alert, awake and Communicative, oriented CNS:  No cranial nerve deficits.  Power equal in all extremities.  Negative cerebellar signs. Skin: Warm and dry.  No rashes noted.  Data Reviewed:    CBC: Recent Labs  Lab 02/19/22 1530 02/20/22 0341  WBC 8.2 9.9  NEUTROABS  --  5.9  HGB 14.8 13.7  HCT 42.7 39.0  MCV 93.8 92.2  PLT 193 093    Basic Metabolic Panel: Recent Labs  Lab 02/19/22 1530 02/20/22 0341  NA 141 141  K 3.9 3.4*  CL 104 109  CO2 25 23  GLUCOSE 142* 108*  BUN 22 18  CREATININE 1.02 1.09  CALCIUM 9.5 9.2  MG  --  1.8    Liver Function Tests: Recent Labs  Lab 02/20/22 0341  AST 18  ALT 14  ALKPHOS 56  BILITOT 1.1  PROT 5.8*  ALBUMIN 3.6     Radiology Studies: CT ANGIO HEAD NECK W WO CM  Result Date: 02/19/2022 CLINICAL DATA:  Right PICA territory infarct. EXAM: CT ANGIOGRAPHY HEAD AND NECK TECHNIQUE: Multidetector CT imaging of the head and neck was performed using the standard protocol during bolus administration of intravenous contrast. Multiplanar CT image reconstructions and MIPs were obtained to evaluate the vascular anatomy. Carotid stenosis measurements (when applicable) are obtained utilizing NASCET criteria, using the distal internal carotid diameter as the denominator. RADIATION DOSE REDUCTION: This exam was performed according to the departmental dose-optimization program which includes automated exposure control, adjustment of the mA and/or kV according to patient size and/or use of iterative reconstruction technique. CONTRAST:  67m OMNIPAQUE IOHEXOL 350 MG/ML SOLN COMPARISON:  MRI of the brain February 19, 2022. FINDINGS: CT HEAD FINDINGS Brain: Hypodensity within the right inferior cerebellar hemisphere corresponding to infarct described on recent MRI. No new acute infarct identified. Small remote infarct in the left thalamus. No acute hemorrhage, hydrocephalus, extra-axial collection or mass lesion. Vascular: No hyperdense vessel or unexpected calcification. Skull: Normal. Negative for fracture or focal lesion. Sinuses/Orbits: No acute finding. Other: None. Review of the MIP images confirms the above findings CTA NECK FINDINGS  Aortic arch: Standard branching. Imaged portion shows no evidence of aneurysm or dissection. Calcified plaques are noted in the aortic arch. No significant stenosis of the major arch vessel origins. Right carotid system: Calcified atherosclerotic plaque is seen in the right carotid bifurcation without hemodynamically significant stenosis. Increased tortuosity of the cervical segment of right ICA. Left carotid system: Calcified atherosclerotic plaque is seen in the left carotid bifurcation without hemodynamically significant stenosis. Increased tortuosity of the cervical. Vertebral arteries: Calcified atherosclerotic plaque is noted at the origin of the non dominant right vertebral artery resulting in mild stenosis. Calcified atherosclerotic plaque is seen at the origin of the dominant left vertebral artery without hemodynamically significant stenosis. Increased tortuosity of the V2 segment of the left vertebral artery is noted without hemodynamically significant stenosis. Skeleton: Degenerative changes of the cervical spine. No acute or aggressive process identified. Other neck: Subcentimeter left thyroid lobe nodule for which no follow-up is recommended. Upper chest: Mild pleuropulmonary apical scarring. Review of the MIP images confirms the above findings  CTA HEAD FINDINGS Anterior circulation: Mild atherosclerotic plaques in the bilateral carotid siphons without hemodynamically significant stenosis. No significant stenosis, proximal occlusion, aneurysm or vascular malformation. Posterior circulation: Non dominant intracranial right vertebral artery supplying primarily the right PICA which remains patent. The right vertebral artery is severely hypoplastic distal to the right PICA origin. The lung dominant left vertebral artery and left PICA are patent. Hypoplastic vertebrobasilar system related to presence of bilateral fetal PCA. Venous sinuses: As permitted by contrast timing, patent. Anatomic variants: Bilateral  fetal PCAs. Review of the MIP images confirms the above findings IMPRESSION: 1. Small hypodense area within the right inferior cerebellar hemisphere corresponding to the right PICA territory infarct described on recent MRI of the brain. No new infarct identified. 2. No intracranial emergency large vessel occlusion identified. 3. Atherosclerotic changes at the origin of a non dominant right vertebral artery resulting in mild stenosis. 4. Mild atherosclerotic changes of the bilateral carotid bifurcation without hemodynamically significant stenosis. Electronically Signed   By: Pedro Earls M.D.   On: 02/19/2022 17:47   MR BRAIN WO CONTRAST  Result Date: 02/19/2022 CLINICAL DATA:  Dizziness EXAM: MRI HEAD WITHOUT CONTRAST TECHNIQUE: Multiplanar, multiecho pulse sequences of the brain and surrounding structures were obtained without intravenous contrast. COMPARISON:  None Available. FINDINGS: Brain: There is diffusion restriction with associated FLAIR signal abnormality in the right cerebellar hemisphere in the PICA distribution consistent with acute infarct. There is no hemorrhage or mass effect. There is no other evidence of acute infarct. There is no acute intracranial hemorrhage or extra-axial fluid collection. There is moderate global parenchymal volume loss with prominence of the ventricular system and extra-axial CSF spaces. There is a mild burden of chronic small vessel ischemic change in the supratentorial white matter. There is no mass lesion.  There is no mass effect or midline shift. Vascular: Normal flow voids. Skull and upper cervical spine: Normal marrow signal. Sinuses/Orbits: The paranasal sinuses are clear. Bilateral lens implants are in place. The globes and orbits are otherwise unremarkable. Other: None. IMPRESSION: Acute infarct in the right cerebellar hemisphere in the PICA distribution without hemorrhage or mass effect. Electronically Signed   By: Valetta Mole M.D.   On:  02/19/2022 16:23      LOS: 0 days    Flora Lipps, MD [facility] Available via Epic secure chat 7am-7pm After these hours, please refer to coverage provider listed on [email or web address] 02/20/2022, 2:27 PM

## 2022-02-20 NOTE — Evaluation (Signed)
Occupational Therapy Evaluation Patient Details Name: Cory Zavala MRN: [id number] DOB: 1930-08-19 Today's Date: 02/20/2022   History of Present Illness Cory Zavala is a 88 y.o. male with medical history significant for essential hypertension, who is admitted to [facility] on 02/19/2022 from [place] with c/o dizziness and found to have acute ischemic R cerebellar stroke.   Clinical Impression   PTA, pt was independent in ADL, IADL, and was driving. Upon eval, pt with decreased vision and balance. Pt with consistently "blurry" vision and describing original symptoms as double vision. Pt reporting blurry out of L eye. Pt R eye dominant. Providing non-corrective lenses with taping to decrease experience of double/blurred vision, and pt reporting improvement with taping ~1/2 of L lens. Pt performing all ADL at supervision level at this time. Pt very conversational and demonstrating problem solving throughout session. Will continue to assess higher level cognition. Recommend OP OT to address vision in the context of ADL and IADL. Will continue to follow acutely.     Recommendations for follow up therapy are one component of a multi-disciplinary discharge planning process, led by the attending physician.  Recommendations may be updated based on patient status, additional functional criteria and insurance authorization.   Follow Up Recommendations  Outpatient OT    Assistance Recommended at Discharge Intermittent Supervision/Assistance  Patient can return home with the following Assistance with cooking/housework;Assist for transportation;A little help with walking and/or transfers;A little help with bathing/dressing/bathroom;Help with stairs or ramp for entrance    Functional Status Assessment  Patient has had a recent decline in their functional status and demonstrates the ability to make significant improvements in function in a reasonable and predictable amount of time.  Equipment  Recommendations  None recommended by OT (Pt has all recommended DME)    Recommendations for Other Services       Precautions / Restrictions Precautions Precautions: Fall Restrictions Weight Bearing Restrictions: No      Mobility Bed Mobility Overal bed mobility: Modified Independent             General bed mobility comments: Increased time, no assist needed    Transfers Overall transfer level: Needs assistance Equipment used: None Transfers: Sit to/from Stand Sit to Stand: Supervision           General transfer comment: assist for safety      Balance Overall balance assessment: Needs assistance Sitting-balance support: No upper extremity supported Sitting balance-Leahy Scale: Good Sitting balance - Comments: Pulling up socks sitting EOB     Standing balance-Leahy Scale: Good                             ADL either performed or assessed with clinical judgement   ADL Overall ADL's : Needs assistance/impaired Eating/Feeding: Modified independent   Grooming: Supervision/safety;Standing   Upper Body Bathing: Set up;Sitting   Lower Body Bathing: Supervison/ safety;Sit to/from stand   Upper Body Dressing : Set up;Sitting   Lower Body Dressing: Supervision/safety;Sit to/from stand Lower Body Dressing Details (indicate cue type and reason): pulling up socks sitting EOB Toilet Transfer: Supervision/safety;Ambulation;Comfort height toilet Toilet Transfer Details (indicate cue type and reason): Simulated in room     Tub/ Shower Transfer: Walk-in shower;Supervision/safety;Ambulation   Functional mobility during ADLs: Supervision/safety General ADL Comments: Pt limited by vision, Reporting glasses with taping facilitating decreased experience of blurred vision     Vision Baseline Vision/History: 1 Wears glasses (for reading) Ability to See in Adequate  Light: 0 Adequate Patient Visual Report: No change from baseline Vision Assessment?: Yes Eye  Alignment: Within Functional Limits Ocular Range of Motion: Within Functional Limits Tracking/Visual Pursuits: Able to track stimulus in all quads without difficulty Saccades: Within functional limits Additional Comments: Pt with blurred vision, and improving with taping over L non-dominant eye, thus likely to be experiencing slight double vision. Providing glasses with taping over L eye and visual exercises to engage in. Review more in depth in future sessions     Perception     Praxis      Pertinent Vitals/Pain Pain Assessment Pain Assessment: No/denies pain     Hand Dominance Right   Extremity/Trunk Assessment Upper Extremity Assessment Upper Extremity Assessment: Overall WFL for tasks assessed (Shoulder limitations at baseline)   Lower Extremity Assessment Lower Extremity Assessment: Defer to PT evaluation   Cervical / Trunk Assessment Cervical / Trunk Assessment: Kyphotic   Communication Communication Communication: No difficulties   Cognition Arousal/Alertness: Awake/alert Behavior During Therapy: WFL for tasks assessed/performed Overall Cognitive Status: Within Functional Limits for tasks assessed                                 General Comments: Pt recalling previously scheduled appointments, PT's name, following multistep directions. Occasional min cues for multistep directions. Contiue to assess higher level cog     General Comments  Educating family on vision and taping of glasses as well as binocular vision.    Exercises     Shoulder Instructions      Home Living Family/patient expects to be discharged to:: Private residence Living Arrangements: Spouse/significant other Available Help at Discharge: Family Type of Home: House Home Access: Stairs to enter [employer] of Steps: 1   Home Layout: Two level;Able to live on main level with bedroom/bathroom     Bathroom Shower/Tub: Hospital doctor Toilet: Handicapped  height     Home Equipment: Conservation officer, nature (2 wheels);Shower seat   Additional Comments: equipment his wife used in the past  Lives With: Spouse    Prior Functioning/Environment Prior Level of Function : Independent/Modified Independent             Mobility Comments: No AD ADLs Comments: completing ADL/IADL's and driving; also mowing the grass; does report he is very careful so he does not fall        OT Problem List: Decreased strength;Decreased activity tolerance;Impaired balance (sitting and/or standing);Impaired vision/perception;Decreased knowledge of use of DME or AE      OT Treatment/Interventions: Self-care/ADL training;Therapeutic exercise;Neuromuscular education;DME and/or AE instruction;Therapeutic activities;Visual/perceptual remediation/compensation;Patient/family education;Balance training    OT Goals(Current goals can be found in the care plan section) Acute Rehab OT Goals Patient Stated Goal: Go home OT Goal Formulation: With patient/family Time For Goal Achievement: 03/06/22 Potential to Achieve Goals: Good  OT Frequency: Min 2X/week    Co-evaluation              AM-PAC OT "6 Clicks" Daily Activity     Outcome Measure Help from another person eating meals?: None Help from another person taking care of personal grooming?: A Little Help from another person toileting, which includes using toliet, bedpan, or urinal?: A Little Help from another person bathing (including washing, rinsing, drying)?: A Little Help from another person to put on and taking off regular upper body clothing?: A Little Help from another person to put on and taking off regular lower body clothing?: A  Little 6 Click Score: 19   End of Session Equipment Utilized During Treatment:  (Pt up in room on OT arrival) Nurse Communication: Mobility status  Activity Tolerance: Patient tolerated treatment well Patient left: in bed;with call bell/phone within reach;with family/visitor  present  OT Visit Diagnosis: Unsteadiness on feet (R26.81);Muscle weakness (generalized) (M62.81);Low vision, both eyes (H54.2);Other abnormalities of gait and mobility (R26.89)                Time: 3794-4461 OT Time Calculation (min): 30 min Charges:  OT General Charges $OT Visit: 1 Visit OT Evaluation $OT Eval Moderate Complexity: 1 Mod OT Treatments $Self Care/Home Management : 8-22 mins  Shanda Howells, OTR/L [facility] Acute Rehabilitation Office: [phone]  Lula Olszewski 02/20/2022, 1:06 PM

## 2022-02-23 ENCOUNTER — Telehealth: Payer: Self-pay

## 2022-02-23 NOTE — Telephone Encounter (Signed)
Transition Care Management Follow-up Telephone Call Date of discharge and from where:[facility] 02-20-22 Dx: acute ischemic stroke   How have you been since you were released from the hospital? Doing ok  Any questions or concerns? No  Items Reviewed: Did the pt receive and understand the discharge instructions provided? Yes  Medications obtained and verified? Yes  Other? No  Any new allergies since your discharge? No  Dietary orders reviewed? Yes Do you have support at home? Yes   Home Care and Equipment/Supplies: Were home health services ordered? no If so, what is the name of the agency? na  Has the agency set up a time to come to the patient's home? not applicable Were any new equipment or medical supplies ordered?  No What is the name of the medical supply agency? na Were you able to get the supplies/equipment? not applicable Do you have any questions related to the use of the equipment or supplies? No  Functional Questionnaire: (I = Independent and D = Dependent) ADLs: I  Bathing/Dressing- I  Meal Prep- I  Eating- I  Maintaining continence- I  Transferring/Ambulation- I  Managing Meds- I  Follow up appointments reviewed:  PCP Hospital f/u appt confirmed? Yes  [facility]? No . Are transportation arrangements needed? No  If their condition worsens, is the pt aware to call PCP or go to the Emergency Dept.? Yes Was the patient provided with contact information for the PCP's office or ED? Yes Was to pt encouraged to call back with questions or concerns? Yes

## 2022-02-24 ENCOUNTER — Ambulatory Visit: Payer: Medicare HMO | Attending: Internal Medicine | Admitting: Physical Therapy

## 2022-02-24 ENCOUNTER — Other Ambulatory Visit: Payer: Self-pay

## 2022-02-24 ENCOUNTER — Encounter: Payer: Self-pay | Admitting: Physical Therapy

## 2022-02-24 DIAGNOSIS — R2689 Other abnormalities of gait and mobility: Secondary | ICD-10-CM | POA: Insufficient documentation

## 2022-02-24 DIAGNOSIS — R2681 Unsteadiness on feet: Secondary | ICD-10-CM | POA: Diagnosis present

## 2022-02-24 DIAGNOSIS — I639 Cerebral infarction, unspecified: Secondary | ICD-10-CM | POA: Diagnosis not present

## 2022-02-24 NOTE — Therapy (Signed)
OUTPATIENT PHYSICAL THERAPY NEURO EVALUATION   Patient Name: Cory Zavala MRN: [id number] DOB:[birth year], 88 y.o., male Today's Date: 02/25/2022   PCP: Kathlene November, MD REFERRING PROVIDER: Kathlene November, MD   PT End of Session - 02/25/22 1711     Visit Number 1    Number of Visits 5    Date for PT Re-Evaluation 03/27/22    Authorization Type [employer]    PT Start Time 1030   Pt arrived late-went to wrong clinic   PT Stop Time 1120    PT Time Calculation (min) 50 min             Past Medical History:  Diagnosis Date   Back pain, chronic    BPH (benign prostatic hypertrophy)    Diverticulosis of colon    LEFT   ED (erectile dysfunction)    GERD (gastroesophageal reflux disease) 09/15/2012   Gross hematuria    2015   HTN (hypertension)    Nocturia    Normal cardiac stress test    PER CARDIOLOGIST NOTE, DR NISHAN, APPROX.  1994   Osteoporosis    RBBB    Past Surgical History:  Procedure Laterality Date   CATARACT EXTRACTION W/ INTRAOCULAR LENS  IMPLANT, BILATERAL  2010   CYSTOSCOPY WITH RETROGRADE PYELOGRAM, URETEROSCOPY AND STENT PLACEMENT Right 12/25/2013   Procedure: CYSTOSCOPY WITH RIGHT RETROGRADE PYELOGRAM, RIGHT URETEROSCOPY  BIOPSY AND STENT: BLADDER BIOPSY;  Surgeon: Claybon Jabs, MD;  Location: [facility];  Service: Urology;  Laterality: Right;   VERTEBROPLASTY  2001   Patient Active Problem List   Diagnosis Date Noted   Acute ischemic stroke ([place]) 02/20/2022   Hypokalemia 02/20/2022   Cerebral infarction involving right cerebellar artery ([place]) 02/19/2022   Hemorrhoids 07/20/2017   Seasonal allergies 09/30/2015   PCP NOTES >>>> 05/15/2015   Gross hematuria 08/09/2014   Hyperglycemia 08/07/2014   Osteoporosis 06/02/2013   GERD (gastroesophageal reflux disease) 09/15/2012   Chest pain 09/15/2012   Back pain 02/12/2012   Annual physical exam 09/01/2011   Erectile dysfunction 09/01/2011   DEGENERATIVE JOINT DISEASE 05/20/2009   PSA,  INCREASED 05/20/2009   Essential hypertension 07/25/2007   BENIGN PROSTATIC HYPERTROPHY 07/25/2007    ONSET DATE: 02/19/2022   REFERRING DIAG: I63.9 (ICD-10-CM) - Cerebrovascular accident (CVA), unspecified mechanism ([place])  THERAPY DIAG:  Unsteadiness on feet  Other abnormalities of gait and mobility  Rationale for Evaluation and Treatment Rehabilitation  SUBJECTIVE:  SUBJECTIVE STATEMENT: Had an episode of acute spinning while getting ready to go to bed.  Tried to "sleep it off" and then it continued in the morning.  Went via EMS and then went to [facility].  Found I had a mini stroke that affected my balance.  Feel like I'm getting better slowly.  Had been given taped glasses to try to get the eye moving more to try to avoid seeing double. Pt accompanied by: self and significant other  PERTINENT HISTORY: See above  PAIN:  Are you having pain? No  PRECAUTIONS: Fall  WEIGHT BEARING RESTRICTIONS No  FALLS: Has patient fallen in last 6 months? 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  Enjoy mowing yard and staying active  PATIENT GOALS To get back to normal.  OBJECTIVE:   DIAGNOSTIC FINDINGS:  MRI of the brain was performed which showed acute infarct in the right cerebellar hemisphere and PICA distribution, without corresponding evidence of hemorrhage or mass effect.   CTA head and neck showed no evidence of emergent large vessel occlusion.   COGNITION: Overall cognitive status: Within functional limits for tasks  assessed   SENSATION: WFL   POSTURE: rounded shoulders, forward head, and increased thoracic kyphosis  LOWER EXTREMITY ROM:   WFL A/ROM BLEs  Active  Right Eval Left Eval  Hip flexion    Hip extension    Hip abduction    Hip adduction    Hip internal rotation    Hip external rotation    Knee flexion    Knee extension    Ankle dorsiflexion    Ankle plantarflexion    Ankle inversion    Ankle eversion     (Blank rows = not tested)  LOWER EXTREMITY MMT:  Grossly tested 5/5 hip flexion, knee flexion/extension, 4/5 bilateral ankle dorsiflexion  MMT Right Eval Left Eval  Hip flexion    Hip extension    Hip abduction    Hip adduction    Hip internal rotation    Hip external rotation    Knee flexion    Knee extension    Ankle dorsiflexion    Ankle plantarflexion    Ankle inversion    Ankle eversion    (Blank rows = not tested)   TRANSFERS: Assistive device utilized: None  Sit to stand: Complete Independence Stand to sit: Complete Independence  GAIT: Gait pattern: step through pattern and wide BOS Distance walked: 60 ft x 2 Assistive device utilized: None Level of assistance: Modified independence Comments: 12.87 sec = 2.55 ft/sec  FUNCTIONAL TESTs:  5 times sit to stand: 13.25 sec Timed up and go (TUG): 15.97 sec   M-CTSIB  Condition 1: Firm Surface, EO 30 Sec, Normal Sway  Condition 2: Firm Surface, EC 30 Sec, Normal Sway  Condition 3: Foam Surface, EO 30 Sec, Mild Sway  Condition 4: Foam Surface, EC 30 Sec, Mild Sway    PATIENT SURVEYS:  FOTO 69 (predicted is 80 by d/c)  VESTIBULAR ASSESSMENT   GENERAL OBSERVATION: Pt reports been given glasses by OT in hospital that were taped and was instructed to remove tape strips gradually to decreased double vision    SYMPTOM BEHAVIOR:   Subjective history: initial dizziness/spinning, no longer c/o this   Non-Vestibular symptoms:  slowed mobility, imbalance  Type of dizziness: Diplopia, Imbalance  (Disequilibrium), Spinning/Vertigo, and Unsteady with head/body turns   Frequency: since 02/19/2022 (CVA)   Duration: initial occurrence last >12 hours   Aggravating factors:  CVA   Relieving factors:  improved with time   Progression of symptoms: better   OCULOMOTOR EXAM:   Ocular Alignment: abnormal and R eye lower than left   Ocular ROM:  R eye appears slightly slower motion   Spontaneous Nystagmus: absent   Gaze-Induced Nystagmus: absent   Smooth Pursuits: intact   Saccades:  quickly darts back to center from left   Convergence/Divergence: decreased convergence/no double vision noted cm      VESTIBULAR - OCULAR REFLEX:    Slow VOR: Normal   VOR Cancellation: Comment: L eye slow to track   Head-Impulse Test: NA   Dynamic Visual Acuity: Not able to be assessed    POSITIONAL TESTING: Did not perform, due to pt not complaining of spinning dizziness since onset of CVA     TODAY'S TREATMENT:  Access Code: [email or web address] URL: [email or web address] Date: 02/24/2022 Prepared by: [facility]  Exercises - Seated Gaze Stabilization with Head Rotation  - 2 x daily - 7 x weekly - 2 sets - 10 reps - Seated Horizontal Saccades  - 2 x daily - 7 x weekly - 2 sets - 10 reps   PATIENT EDUCATION: Education details: Eval results, POC, initial HEP Person educated: Patient and Spouse Education method: Explanation, Demonstration, and Handouts Education comprehension: verbalized understanding and returned demonstration   HOME EXERCISE PROGRAM: See above    GOALS: Goals reviewed with patient? Yes  SHORT TERM GOALS: = LTGs   LONG TERM GOALS: Target date: 03/27/2022  Pt will be independent with HEP for improved strength, balance, gait. Baseline:  Goal status: INITIAL  2.  Pt will improve TUG score to less than or equal to 13.5 sec for decreased fall risk. Baseline: 15.97 sec Goal status: INITIAL  3.  Pt will improve gait velocity to  at least 2.62 ft/sec for improved gait efficiency and safety. Baseline: 2.55 ft/sec Goal status: INITIAL    ASSESSMENT:  CLINICAL IMPRESSION: Patient is a 88 y.o. male who was seen today for physical therapy evaluation and treatment following CVA.  He presents to OPPT with reports of initial dizziness and spinning at onset of CVA, which has since resolved.  He now notes more unsteadiness and general slow, cautious movement patterns.  He is having some double vision, which was addressed by OT in the hospital and he has OPOT referral.  He presents with decreased strength, balance, gait velocity and is at increased risk of falls per TUG score.  He does have some slowed visual tracking with oculomotor testing, but it does not bring on dizziness. Prior to hospitalization, he was independent, doing yardwork and doing some pastoral work.  He will benefit from skilled PT towards goals for return to independent PLOF and decreased fall risk.    OBJECTIVE IMPAIRMENTS Abnormal gait, decreased balance, decreased mobility, decreased strength, and postural dysfunction.   ACTIVITY LIMITATIONS locomotion level  PARTICIPATION LIMITATIONS: community activity, occupation, and yard work  PERSONAL FACTORS 3+ comorbidities: see PMH above  are also affecting patient's functional outcome.   REHAB POTENTIAL: Good  CLINICAL DECISION MAKING: Stable/uncomplicated  EVALUATION COMPLEXITY: Low  PLAN: PT FREQUENCY: 1x/week  PT DURATION: 4 weeks  PLANNED INTERVENTIONS: Therapeutic exercises, Therapeutic activity, Neuromuscular re-education, Balance training, Gait training, Patient/Family education, Self Care, Vestibular training, and Canalith  repositioning  PLAN FOR NEXT SESSION: Review initial HEP and follow up to see if pt has made appt for OT eval (he was concerned about co-pay); balance and functional strength exercises added to HEP.   Frazier Butt., PT 02/25/2022, 5:22 PM  [facility] at  [facility], [address], [place] [address] Phone # [phone] Fax # [phone]

## 2022-03-03 ENCOUNTER — Ambulatory Visit (INDEPENDENT_AMBULATORY_CARE_PROVIDER_SITE_OTHER): Payer: Medicare HMO | Admitting: Internal Medicine

## 2022-03-03 ENCOUNTER — Encounter: Payer: Self-pay | Admitting: Internal Medicine

## 2022-03-03 VITALS — BP 120/76 | HR 95 | Temp 98.1°F | Resp 18 | Ht 66.5 in | Wt 164.0 lb

## 2022-03-03 DIAGNOSIS — I63541 Cerebral infarction due to unspecified occlusion or stenosis of right cerebellar artery: Secondary | ICD-10-CM

## 2022-03-03 DIAGNOSIS — I69398 Other sequelae of cerebral infarction: Secondary | ICD-10-CM

## 2022-03-03 DIAGNOSIS — I69393 Ataxia following cerebral infarction: Secondary | ICD-10-CM | POA: Diagnosis not present

## 2022-03-03 DIAGNOSIS — H538 Other visual disturbances: Secondary | ICD-10-CM

## 2022-03-03 DIAGNOSIS — I10 Essential (primary) hypertension: Secondary | ICD-10-CM

## 2022-03-03 NOTE — Patient Instructions (Addendum)
Please bring [place] or mail a copy of your healthcare power of attorney for your chart.   Vaccines I recommend:  Tdap (tetanus) Covid booster Flu shot RSV vaccine  Stop Plavix 3 weeks after you have started  Check the  blood pressure regularly BP GOAL is between 110/65 and  135/85. If it is consistently higher or lower, let me know    GO TO THE LAB : Get the blood work    See you in December

## 2022-03-03 NOTE — Progress Notes (Unsigned)
Subjective:    Patient ID: Cory Zavala, male    DOB: Mar 08, 1931, 88 y.o.   MRN: [id number]  DOS:  03/03/2022 Type of visit - description: Hospital follow-up, TCM 14  Admitted to hospital discharge 02/20/2022  Presented to the ER with acute onset of dizziness, MRI showed an acute R cerebellar hemispheric infarct PICA distribution with no mass effect. He was outside of the tPA window  He was discharged in improved condition. Here with his wife for a follow-up   Review of Systems Since left the hospital, dizziness have definitely improved, he still has some imbalance. He has some ill-defined visual disturbances (diplopia?)  that developed at the time of the stroke, they are also improved but not gone.  Denies nausea vomiting No blood in the stools or in the urine No headaches   Past Medical History:  Diagnosis Date   Back pain, chronic    BPH (benign prostatic hypertrophy)    Diverticulosis of colon    LEFT   ED (erectile dysfunction)    GERD (gastroesophageal reflux disease) 09/15/2012   Gross hematuria    2015   HTN (hypertension)    Nocturia    Normal cardiac stress test    PER CARDIOLOGIST NOTE, DR NISHAN, APPROX.  1994   Osteoporosis    RBBB     Past Surgical History:  Procedure Laterality Date   CATARACT EXTRACTION W/ INTRAOCULAR LENS  IMPLANT, BILATERAL  2010   CYSTOSCOPY WITH RETROGRADE PYELOGRAM, URETEROSCOPY AND STENT PLACEMENT Right 12/25/2013   Procedure: CYSTOSCOPY WITH RIGHT RETROGRADE PYELOGRAM, RIGHT URETEROSCOPY  BIOPSY AND STENT: BLADDER BIOPSY;  Surgeon: Claybon Jabs, MD;  Location: [facility];  Service: Urology;  Laterality: Right;   VERTEBROPLASTY  2001    Current Outpatient Medications  Medication Instructions   amLODipine (NORVASC) 5 mg, Oral, Daily   aspirin EC 81 mg, Oral, Daily, Swallow whole.   Cholecalciferol (D3 ADULT PO) 1 tablet, Oral, Daily   clopidogrel (PLAVIX) 75 mg, Oral, Daily   EPINEPHrine (EPI-PEN) 0.3 mg,  Intramuscular, As needed, INJECT 0.3MLS(CONTENTS OF 1 SYRINGE) INTO THE MUSCLE AS DIRECTED   loratadine (CLARITIN) 10 MG tablet Take 1-2 tablets 1-2 times per day   losartan (COZAAR) 50 MG tablet TAKE 1 TABLET BY MOUTH EVERY DAY   Multiple Vitamin (MULTIVITAMIN) tablet 1 tablet, Oral, Daily   Multiple Vitamins-Minerals (PRESERVISION AREDS) CAPS 1 capsule, Oral, Daily   Nutritional Supplements (OSTEO ADVANCE PO) 4 tablets, Oral, Daily, OSTEO GOLD (BRAND)    OMEGA-3 FATTY ACIDS PO 1 tablet, Oral, Daily   pantoprazole (PROTONIX) 40 MG tablet TAKE 1 TABLET (40 MG TOTAL) BY MOUTH TWICE A DAY BEFORE MEALS   rosuvastatin (CRESTOR) 10 mg, Oral, Daily   vitamin B-12 (CYANOCOBALAMIN) 500 mcg, Oral, Daily       Objective:   Physical Exam BP 120/76   Pulse 95   Temp 98.1 F (36.7 C) (Oral)   Resp 18   Ht 5' 6.5" (1.689 m)   Wt 164 lb (74.4 kg)   SpO2 96%   BMI 26.07 kg/m  General:   Well developed, NAD, BMI noted. HEENT:  Normocephalic . Face symmetric, atraumatic Lungs:  CTA B Normal respiratory effort, no intercostal retractions, no accessory muscle use. Heart: RRR,  no murmur.  Lower extremities: no pretibial edema bilaterally  Skin: Not pale. Not jaundice Neurologic:  alert & oriented X3.  Speech normal, gait appropriate for age and unassisted Motor symmetric.  EOMI. Oral-nasal fold slightly flat on  the left but no facial weakness Psych--  Cognition and judgment appear intact.  Cooperative with normal attention span and concentration.  Behavior appropriate. No anxious or depressed appearing.      Assessment     ssessment   Hyperglycemia HTN GERD RBBB MSK: --Chronic back pain -- Vertebroplasty 2001 -- Osteoporosis T score -3.3 (2014), declined all meds 2014 and  2019 GU: ---BPH ---ED ---Gross hematuria 2015  Initial CT show a question of urothelial cancer, subsequently a ureteroscopy was done and reportedly normal, subsequently a biopsy was negative. Follow-up  MRI 01-2014 show improvement. Saw urology ~ 04-2015 Exercise stress test 2014 negative Bee sting allergies: has a Epi pen   PLAN: Stroke: Admitted few days ago with acute ischemic stroke, see MRI report.  Neurology consulted, they Rx aspirin and Plavix x3 weeks then aspirin alone. Follow-up CT angio head and neck: No intracranial large vessel occlusion.  Atherosclerotic changes at the origin of the nondominant R vertebral artery with mild stenosis but overall was satisfactory, echo showing normal LV. He was recommended outpatient PT, ASA indefinitely, Plavix x3 weeks. He is following all the recommendations. Overall feels better. He is somewhat frustrated because he is not 100% recuperated, explained that this will take time. Plan: CMP, CBC, see neurology as planned.  Follow-up scheduled for December. Dysphagia: See last visit, at baseline, no worse. HTN: BP well controlled on amlodipine and losartan, same meds he will take it before the stroke.  Preventive care: Declined flu shot today, likes to wait few more days      DJD: Complaining of right knee pain, saw Ortho last year, got a local injection, since then is better but he still requires meloxicam 15 mg occasionally. Explained GI side effects and potential for kidney damage. Plan: Use primarily Tylenol, knee brace, he likes to use BLU-EMU which is okay.  I recommended to take meloxicam at the reduced to also 7.5 mg infrequently, once or twice a week if needed.  He verbalized understanding.  Prescription sent. Dysphagia: Saw GI 07/25/2021, barium study showed moderate size hiatal hernia, GE junction above the diaphragma, paraesophageal component?.  Advise regards swallowing precautions were discussed, PPI dose reduced, they will consider EGD if needed.  At this point symptoms are resolved Constipation: As described above, mild, recommend MiraLAX every other day.  Call if not better HTN: Continue amlodipine, losartan, next visit is in  December thus we will check a BMP today. RTC 05/2022 physical

## 2022-03-04 LAB — CBC WITH DIFFERENTIAL/PLATELET
Basophils Absolute: 0.1 10*3/uL (ref 0.0–0.1)
Basophils Relative: 1 % (ref 0.0–3.0)
Eosinophils Absolute: 0.1 10*3/uL (ref 0.0–0.7)
Eosinophils Relative: 0.7 % (ref 0.0–5.0)
HCT: 41.8 % (ref 39.0–52.0)
Hemoglobin: 13.8 g/dL (ref 13.0–17.0)
Lymphocytes Relative: 27.5 % (ref 12.0–46.0)
Lymphs Abs: 2.1 10*3/uL (ref 0.7–4.0)
MCHC: 33.1 g/dL (ref 30.0–36.0)
MCV: 96.5 fl (ref 78.0–100.0)
Monocytes Absolute: 0.5 10*3/uL (ref 0.1–1.0)
Monocytes Relative: 6.2 % (ref 3.0–12.0)
Neutro Abs: 4.9 10*3/uL (ref 1.4–7.7)
Neutrophils Relative %: 64.6 % (ref 43.0–77.0)
Platelets: 190 10*3/uL (ref 150.0–400.0)
RBC: 4.33 Mil/uL (ref 4.22–5.81)
RDW: 12.5 % (ref 11.5–15.5)
WBC: 7.7 10*3/uL (ref 4.0–10.5)

## 2022-03-04 LAB — COMPREHENSIVE METABOLIC PANEL
ALT: 15 U/L (ref 0–53)
AST: 16 U/L (ref 0–37)
Albumin: 3.8 g/dL (ref 3.5–5.2)
Alkaline Phosphatase: 64 U/L (ref 39–117)
BUN: 15 mg/dL (ref 6–23)
CO2: 28 mEq/L (ref 19–32)
Calcium: 9.2 mg/dL (ref 8.4–10.5)
Chloride: 105 mEq/L (ref 96–112)
Creatinine, Ser: 1.32 mg/dL (ref 0.40–1.50)
GFR: 47.75 mL/min — ABNORMAL LOW (ref >60.00)
Glucose, Bld: 132 mg/dL — ABNORMAL HIGH (ref 70–99)
Potassium: 4.2 mEq/L (ref 3.5–5.1)
Sodium: 141 mEq/L (ref 135–145)
Total Bilirubin: 0.7 mg/dL (ref 0.2–1.2)
Total Protein: 6 g/dL (ref 6.0–8.3)

## 2022-03-04 NOTE — Assessment & Plan Note (Signed)
Stroke: Admitted few days ago with acute ischemic stroke, see MRI report.  Neurology consulted, they Rx aspirin and Plavix x3 weeks then aspirin alone. Follow-up CT angio head and neck: No intracranial large vessel occlusion.  Atherosclerotic changes at the origin of the nondominant R vertebral artery with mild stenosis but overall was satisfactory, echo showing normal LV. He was recommended outpatient PT, ASA indefinitely, Plavix x3 weeks. He is following all the recommendations a and overall feels better. He is somewhat frustrated because he is not 100% recuperated, explained that this will take time. Plan: CMP, CBC, see neurology as planned.  Follow-up scheduled for December. Dysphagia: See last visit, at baseline, no worse. HTN: BP well controlled on amlodipine and losartan, same meds he will take it before the stroke.   Preventive care: Declined flu shot today, likes to wait few more days RTC scheduled for December

## 2022-03-10 ENCOUNTER — Ambulatory Visit: Payer: Medicare HMO | Admitting: Physical Therapy

## 2022-03-10 DIAGNOSIS — R2681 Unsteadiness on feet: Secondary | ICD-10-CM

## 2022-03-10 DIAGNOSIS — R2689 Other abnormalities of gait and mobility: Secondary | ICD-10-CM

## 2022-03-10 DIAGNOSIS — I639 Cerebral infarction, unspecified: Secondary | ICD-10-CM | POA: Diagnosis not present

## 2022-03-10 NOTE — Therapy (Signed)
OUTPATIENT PHYSICAL THERAPY NEURO EVALUATION   Patient Name: Cory Zavala MRN: [id number] DOB:03/17/1931, 88 y.o., male Today's Date: 03/10/2022   PCP: Kathlene November, MD REFERRING PROVIDER: Kathlene November, MD   PT End of Session - 03/10/22 1103     Visit Number 2    Number of Visits 5    Date for PT Re-Evaluation 03/27/22    Authorization Type [employer]    PT Start Time 1111    PT Stop Time 1149    PT Time Calculation (min) 38 min    Activity Tolerance Patient tolerated treatment well    Behavior During Therapy [facility] for tasks assessed/performed              Past Medical History:  Diagnosis Date   Back pain, chronic    BPH (benign prostatic hypertrophy)    Diverticulosis of colon    LEFT   ED (erectile dysfunction)    GERD (gastroesophageal reflux disease) 09/15/2012   Gross hematuria    2015   HTN (hypertension)    Nocturia    Normal cardiac stress test    PER CARDIOLOGIST NOTE, DR NISHAN, APPROX.  1994   Osteoporosis    RBBB    Past Surgical History:  Procedure Laterality Date   CATARACT EXTRACTION W/ INTRAOCULAR LENS  IMPLANT, BILATERAL  2010   CYSTOSCOPY WITH RETROGRADE PYELOGRAM, URETEROSCOPY AND STENT PLACEMENT Right 12/25/2013   Procedure: CYSTOSCOPY WITH RIGHT RETROGRADE PYELOGRAM, RIGHT URETEROSCOPY  BIOPSY AND STENT: BLADDER BIOPSY;  Surgeon: Claybon Jabs, MD;  Location: [facility];  Service: Urology;  Laterality: Right;   VERTEBROPLASTY  2001   Patient Active Problem List   Diagnosis Date Noted   Acute ischemic stroke ([place]) 02/20/2022   Hypokalemia 02/20/2022   Cerebral infarction involving right cerebellar artery ([place]) 02/19/2022   Hemorrhoids 07/20/2017   Seasonal allergies 09/30/2015   PCP NOTES >>>> 05/15/2015   Gross hematuria 08/09/2014   Hyperglycemia 08/07/2014   Osteoporosis 06/02/2013   GERD (gastroesophageal reflux disease) 09/15/2012   Chest pain 09/15/2012   Back pain 02/12/2012   Annual physical exam 09/01/2011    Erectile dysfunction 09/01/2011   DEGENERATIVE JOINT DISEASE 05/20/2009   PSA, INCREASED 05/20/2009   Essential hypertension 07/25/2007   BENIGN PROSTATIC HYPERTROPHY 07/25/2007    ONSET DATE: 02/19/2022   REFERRING DIAG: I63.9 (ICD-10-CM) - Cerebrovascular accident (CVA), unspecified mechanism ([place])  THERAPY DIAG:  Unsteadiness on feet  Other abnormalities of gait and mobility  Rationale for Evaluation and Treatment Rehabilitation  SUBJECTIVE:  SUBJECTIVE STATEMENT: Sunday was a good day, and then yesterday was okay.  Not sure if this is normal or not.  I'm doing a little more each day, but still being cautious, because I do not want to fall.  Pt accompanied by: self and significant other  PERTINENT HISTORY: See above  PAIN:  Are you having pain? No  PRECAUTIONS: Fall  WEIGHT BEARING RESTRICTIONS No  FALLS: Has patient fallen in last 6 months? 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  Enjoy mowing yard and staying active  PATIENT GOALS To get back to normal.  OBJECTIVE:   TODAY'S TREATMENT: 03/10/2022 Activity Comments  Reviewed initial HEP and pt return demo understanding   DGI:  12/24  See below (Scores <19/24 indicate increased fall risk)  Standing hip abduction, extension 10 reps, 2 sets  2nd set alternating  Standing marching 2 x 10 2nd set 2# weight  Heel toe raises 2 x 10   Sidestepping R and L at counter, 3 reps No UE support  Forward/back walking at counter x 3   Tandem walk forward, 4 reps at counter UE support and cues to look  ahead at target  On [employer]ire[employer]:  feet apat, feet together EO and EC; heel/toe raises Requires UE support    [facility] Adult PT Treatment/Exercise - 03/10/22 0001       Standardized Balance Assessment   Standardized Balance Assessment Dynamic Gait Index      Dynamic Gait Index   Level Surface Mild Impairment    Change in Gait Speed Mild Impairment    Gait with Horizontal Head Turns Moderate Impairment    Gait with Vertical Head Turns Moderate Impairment    Gait and Pivot Turn Normal    Step Over Obstacle Severe Impairment    Step Around Obstacles Mild Impairment    Steps Moderate Impairment    Total Score 12             Access Code: [email or web address] URL: [email or web address] Date: 03/10/2022-most recent updates Prepared by: [facility]  Exercises - Seated Gaze Stabilization with Head Rotation  - 2 x daily - 7 x weekly - 2 sets - 10 reps - Seated Horizontal Saccades  - 2 x daily - 7 x weekly - 2 sets - 10 reps - March in Place  - 1 x daily - 5 x weekly - 2 sets - 10 reps - Standing Hip Abduction with Counter Support  - 1 x daily - 7 x weekly - 2 sets - 10 reps - Heel Toe Raises with Counter Support  - 1 x daily - 7 x weekly - 2 sets - 10 reps  PATIENT EDUCATION: Education details: HEP updates; DGI score and fall risk; recommendation to follow through with OT eval to more fully address double vision Person educated: Patient Education method: Explanation, Demonstration, and Handouts Education comprehension: verbalized understanding and returned demonstration  ---------------------------------------------------------------------------------- From evaluation: DIAGNOSTIC FINDINGS:  MRI of the brain was performed which showed acute infarct in the right cerebellar hemisphere and PICA distribution, without corresponding evidence of hemorrhage or mass effect.   CTA head and neck showed no evidence of emergent large vessel occlusion.    COGNITION: Overall cognitive status: Within functional limits for tasks assessed  SENSATION: WFL   POSTURE: rounded shoulders, forward head, and increased thoracic kyphosis  LOWER EXTREMITY ROM:   WFL A/ROM BLEs  Active  Right Eval Left Eval  Hip flexion    Hip extension    Hip abduction    Hip adduction    Hip internal rotation    Hip external rotation    Knee flexion    Knee extension    Ankle dorsiflexion    Ankle plantarflexion    Ankle inversion    Ankle eversion     (Blank rows = not tested)  LOWER EXTREMITY MMT:  Grossly tested 5/5 hip flexion, knee flexion/extension, 4/5 bilateral ankle dorsiflexion  MMT Right Eval Left Eval  Hip flexion    Hip extension    Hip abduction    Hip adduction    Hip internal rotation    Hip external rotation    Knee flexion    Knee extension    Ankle dorsiflexion    Ankle plantarflexion    Ankle inversion    Ankle eversion    (Blank rows = not tested)   TRANSFERS: Assistive device utilized: None  Sit to stand: Complete Independence Stand to sit: Complete Independence  GAIT: Gait pattern: step through pattern and wide BOS Distance walked: 60 ft x 2 Assistive device utilized: None Level of assistance: Modified independence Comments: 12.87 sec = 2.55 ft/sec  FUNCTIONAL TESTs:  5 times sit to stand: 13.25 sec Timed up and go (TUG): 15.97 sec   M-CTSIB  Condition 1: Firm Surface, EO 30 Sec, Normal Sway  Condition 2: Firm Surface, EC 30 Sec, Normal Sway  Condition 3: Foam Surface, EO 30 Sec, Mild Sway  Condition 4: Foam Surface, EC 30 Sec, Mild Sway    PATIENT SURVEYS:  FOTO 69 (predicted is 80 by d/c)  VESTIBULAR ASSESSMENT   GENERAL OBSERVATION: Pt reports been given glasses by OT in hospital that were taped and was instructed to remove tape strips gradually to decreased double vision    SYMPTOM BEHAVIOR:   Subjective history: initial dizziness/spinning, no longer c/o this   Non-Vestibular symptoms:   slowed mobility, imbalance   Type of dizziness: Diplopia, Imbalance (Disequilibrium), Spinning/Vertigo, and Unsteady with head/body turns   Frequency: since 02/19/2022 (CVA)   Duration: initial occurrence last >12 hours   Aggravating factors:  CVA   Relieving factors:  improved with time   Progression of symptoms: better   OCULOMOTOR EXAM:   Ocular Alignment: abnormal and R eye lower than left   Ocular ROM:  R eye appears slightly slower motion   Spontaneous Nystagmus: absent   Gaze-Induced Nystagmus: absent   Smooth Pursuits: intact   Saccades:  quickly darts back to center from left   Convergence/Divergence: decreased convergence/no double vision noted cm      VESTIBULAR - OCULAR REFLEX:    Slow VOR: Normal   VOR Cancellation: Comment: L eye slow to track   Head-Impulse Test: NA   Dynamic Visual Acuity: Not able to be assessed    POSITIONAL TESTING: Did not perform, due to pt not complaining of spinning dizziness since onset of CVA --------------------------------------------------------------------------------------------------------   GOALS: Goals reviewed with patient? Yes  SHORT TERM GOALS: = LTGs   LONG TERM GOALS: Target date: 03/27/2022  Pt will be independent with HEP for improved strength, balance, gait. Baseline:  Goal status: IN PROGRESS  2.  Pt will improve TUG score to less than or equal to 13.5 sec for decreased fall risk. Baseline: 15.97 sec Goal  status:IN PROGRESS  3.  Pt will improve gait velocity to at least 2.62 ft/sec for improved gait efficiency and safety. Baseline: 2.55 ft/sec Goal status: IN PROGRESS    ASSESSMENT:  CLINICAL IMPRESSION: Pt returns today to therapy (per his request to come every other week), with reports of slight improvements, still being cautious with mobility.  He reports continueing to have double vision at times; PT recommends that he schedule an OT eval to address the vision issues.  Assessed DGI today, with score 12/24  indicating increased fall risk.  Updated HEP to address balance exercises.  He will continue to benefit from skilled PT towards goals for improved functional mobility and decreased fall risk. Patient is a 88 y.o. male who was seen today for physical therapy evaluation and treatment following CVA.  He presents to OPPT with reports of initial dizziness and spinning at onset of CVA, which has since resolved.  He now notes more unsteadiness and general slow, cautious movement patterns.  He is having some double vision, which was addressed by OT in the hospital and he has OPOT referral.  He presents with decreased strength, balance, gait velocity and is at increased risk of falls per TUG score.  He does have some slowed visual tracking with oculomotor testing, but it does not bring on dizziness. Prior to hospitalization, he was independent, doing yardwork and doing some pastoral work.  He will benefit from skilled PT towards goals for return to independent PLOF and decreased fall risk.    OBJECTIVE IMPAIRMENTS Abnormal gait, decreased balance, decreased mobility, decreased strength, and postural dysfunction.   ACTIVITY LIMITATIONS locomotion level  PARTICIPATION LIMITATIONS: community activity, occupation, and yard work  PERSONAL FACTORS 3+ comorbidities: see PMH above  are also affecting patient's functional outcome.   REHAB POTENTIAL: Good  CLINICAL DECISION MAKING: Stable/uncomplicated  EVALUATION COMPLEXITY: Low  PLAN: PT FREQUENCY: 1x/week  PT DURATION: 4 weeks  PLANNED INTERVENTIONS: Therapeutic exercises, Therapeutic activity, Neuromuscular re-education, Balance training, Gait training, Patient/Family education, Self Care, Vestibular training, and [place] repositioning  PLAN FOR NEXT SESSION: Review updates to HEP and follow up to see if pt has made appt for OT eval (he was concerned about co-pay); add compliant surface exercises to HEP; check goals and complete FOTO (will need to recert  if continuing beyond next visit, as pt is only scheduling once every other week)   Mady Haagensen W., PT 03/10/2022, 12:03 PM  [facility] at [facility] [address] [place], [place] [address] Phone # [phone] Fax # [phone]

## 2022-03-24 ENCOUNTER — Ambulatory Visit: Payer: Medicare HMO | Admitting: Physical Therapy

## 2022-03-25 ENCOUNTER — Ambulatory Visit: Payer: Medicare HMO | Attending: Internal Medicine | Admitting: Occupational Therapy

## 2022-03-25 ENCOUNTER — Encounter: Payer: Self-pay | Admitting: Occupational Therapy

## 2022-03-25 ENCOUNTER — Other Ambulatory Visit: Payer: Self-pay

## 2022-03-25 DIAGNOSIS — R2681 Unsteadiness on feet: Secondary | ICD-10-CM | POA: Insufficient documentation

## 2022-03-25 DIAGNOSIS — H532 Diplopia: Secondary | ICD-10-CM | POA: Diagnosis present

## 2022-03-25 DIAGNOSIS — I639 Cerebral infarction, unspecified: Secondary | ICD-10-CM | POA: Insufficient documentation

## 2022-03-25 NOTE — Therapy (Signed)
OUTPATIENT OCCUPATIONAL THERAPY NEURO EVALUATION  Patient Name: Cory Zavala MRN: [id number] DOB:1930-12-24, 88 y.o., male Today's Date: 03/25/2022  PCP: Colon Branch, MD REFERRING PROVIDER: Flora Lipps, MD   OT End of Session - 03/25/22 1146     Visit Number 1    Authorization Type Aetna Medicare    Authorization Time Period VL: none    OT Start Time 0354    OT Stop Time 1230    OT Time Calculation (min) 45 min    Behavior During Therapy WFL for tasks assessed/performed            Past Medical History:  Diagnosis Date   Back pain, chronic    BPH (benign prostatic hypertrophy)    Diverticulosis of colon    LEFT   ED (erectile dysfunction)    GERD (gastroesophageal reflux disease) 09/15/2012   Gross hematuria    2015   HTN (hypertension)    Nocturia    Normal cardiac stress test    PER CARDIOLOGIST NOTE, DR NISHAN, APPROX.  1994   Osteoporosis    RBBB    Past Surgical History:  Procedure Laterality Date   CATARACT EXTRACTION W/ INTRAOCULAR LENS  IMPLANT, BILATERAL  2010   CYSTOSCOPY WITH RETROGRADE PYELOGRAM, URETEROSCOPY AND STENT PLACEMENT Right 12/25/2013   Procedure: CYSTOSCOPY WITH RIGHT RETROGRADE PYELOGRAM, RIGHT URETEROSCOPY  BIOPSY AND STENT: BLADDER BIOPSY;  Surgeon: Claybon Jabs, MD;  Location: [facility];  Service: Urology;  Laterality: Right;   VERTEBROPLASTY  2001   Patient Active Problem List   Diagnosis Date Noted   Acute ischemic stroke ([place]) 02/20/2022   Hypokalemia 02/20/2022   Cerebral infarction involving right cerebellar artery ([place]) 02/19/2022   Hemorrhoids 07/20/2017   Seasonal allergies 09/30/2015   PCP NOTES >>>> 05/15/2015   Gross hematuria 08/09/2014   Hyperglycemia 08/07/2014   Osteoporosis 06/02/2013   GERD (gastroesophageal reflux disease) 09/15/2012   Chest pain 09/15/2012   Back pain 02/12/2012   Annual physical exam 09/01/2011   Erectile dysfunction 09/01/2011   DEGENERATIVE JOINT DISEASE 05/20/2009    PSA, INCREASED 05/20/2009   Essential hypertension 07/25/2007   BENIGN PROSTATIC HYPERTROPHY 07/25/2007    ONSET DATE: 02/19/22  REFERRING DIAG: I63.9 (ICD-10-CM) - Cerebrovascular accident (CVA), unspecified mechanism ([place])  THERAPY DIAG:  Cerebrovascular accident (CVA), unspecified mechanism ([place])  Double vision  Rationale for Evaluation and Treatment Rehabilitation  SUBJECTIVE:   SUBJECTIVE STATEMENT: Pt arrives to OP OT evaluation w/ primary concern related to double vision 2/2 a stroke toward the beginning of September. Reports he noticed it one night when he was having trouble walking around, but reported he went to bed thinking he could "sleep it off." Pt states his vision has improved some since leaving the hospital and he has been slowly progressing w/ occlusion glasses that the OT provided education on in the hospital. Pt accompanied by: self  PERTINENT HISTORY: Acute infarct in R cerebellar hemisphere in the PICA distribution 02/19/22; h/o essential HTN  PRECAUTIONS: Fall  WEIGHT BEARING RESTRICTIONS No  PAIN: Are you having pain? No  FALLS: Has patient fallen in last 6 months? No  LIVING ENVIRONMENT: Lives with: lives with their spouse Lives in: House/apartment Stairs:  Has 2 houses, 1 has stairs (13 steps, rail L side going up); the other is one-level w/ 2-3 STE and handrails Has following equipment at home: Gilford Rile - 4 wheeled and Grab bars  PLOF: Independent, Vocation/Vocational requirements: retired, and Leisure: yardwork, reading  [place]: "vision"  OBJECTIVE:   HAND DOMINANCE: Right  ADLs: Overall ADLs: Pt reports he is able to complete all BADLs w/ at least Mod Ind considering balance and visual deficits  MOBILITY STATUS:  Ambulated in/out of session w/out difficulty, requiring extended time due to reported fear of falling; will defer to PT services to address functional mobility  UPPER EXTREMITY ROM    BUE WFL per gross  assessment  COORDINATION: WFL  SENSATION: WFL  COGNITION: Overall cognitive status: Within functional limits for tasks assessed  VISION: Subjective report: Reports having glasses occluded on nasal side of L lens; currently about 1/3 occluded, which he has decreased since d/c from the hospital Baseline vision: Wears glasses for reading only Visual history: cataracts with h/o corrective surgery  VISION ASSESSMENT: Dominant eye: OD Eye alignment: WFL Ocular ROM: WFL Tracking/Visual pursuits: Able to track stimulus in all quads without difficulty Saccades: slightly decreased speed/targeting to R side Convergence: Impaired: improved w/ repetition Visual Fields: no apparent deficits Diplopia assessment: objects splits side to side, objects split on top of one another, and present in far gaze Depth perception: finger to nose test [facility]  Patient has difficulty with following activities due to following visual impairments: Functional mobility, yardwork, driving, reading/watching TV   TODAY'S TREATMENT:  None-evaluation only   PATIENT EDUCATION: Educated on role and purpose of OT as well as potential interventions and goals for therapy based on initial evaluation findings. Reviewed visual exercises and introduced convergence/divergence exercise (see HEP section below) Person educated: Patient Education method: Explanation Education comprehension: verbalized understanding   HOME EXERCISE PROGRAM: Visual exercises administered in hospital (tracking, convergence, distance saccades)   GOALS: Goals reviewed with patient? Yes  SHORT TERM GOALS: Target date: 04/24/22    Status:  1 Pt will verbalize understanding of at least 1 visual compensatory strategy (e.g., using a line guide, high contrast, etc.) to improve participation in reading activities Baseline: compensatory strategies introduced during evaluation Initial  2 Pt will demonstrate independence w/ HEP designed for visual  deficits to improve participation in leisure tasks and other IADLs Baseline: to be administered Initial      ASSESSMENT:  CLINICAL IMPRESSION: Pt is a 88 y/o who presents to OP OT due to visual deficits and unsteadiness on his feet s/p R cerebellar infarct on 02/18/22. Pt currently lives with his wife and is retired, but enjoys participation in yard work and reading. Pt will benefit from skilled occupational therapy services to address diplopia and visual perception, balance, and introduction of compensatory strategies/AE prn to improve participation and safety during ADLs and IADLs.  PERFORMANCE DEFICITS in functional skills including mobility, balance, and vision.   IMPAIRMENTS are limiting patient from ADLs, IADLs, and leisure.   COMORBIDITIES has no other co-morbidities that affects occupational performance. Patient will benefit from skilled OT to address above impairments and improve overall function.  MODIFICATION OR ASSISTANCE TO COMPLETE EVALUATION: No modification of tasks or assist necessary to complete an evaluation.  OT OCCUPATIONAL PROFILE AND HISTORY: Problem focused assessment: Including review of records relating to presenting problem.  CLINICAL DECISION MAKING: Moderate - several treatment options, min-mod task modification necessary  REHAB POTENTIAL: Good  EVALUATION COMPLEXITY: Low   PLAN: OT FREQUENCY: every other week (reduced frequency to allow for continued recovery and considering pt's financial and commute concerns)  OT DURATION:  2 sessions  PLANNED INTERVENTIONS: self care/ADL training, therapeutic exercise, therapeutic activity, neuromuscular re-education, functional mobility training, patient/family education, visual/perceptual remediation/compensation, energy conservation, and DME and/or AE instructions  RECOMMENDED OTHER  SERVICES: Currently receiving PT services at this location  CONSULTED AND AGREED WITH PLAN OF CARE: Patient  PLAN FOR NEXT SESSION:  Review visual exercises included in HEP; introduce Brock string convergence exercise (30 sec, 2x/day); ensure understanding of follow up w/ optometrist/ophthalmologist; review compensatory patterns for visual deficits prn   Kathrine Cords, MSOT, OTR/L 03/25/2022, 11:48 AM

## 2022-03-26 ENCOUNTER — Other Ambulatory Visit: Payer: Self-pay | Admitting: Internal Medicine

## 2022-04-13 ENCOUNTER — Ambulatory Visit: Payer: Medicare HMO | Admitting: Occupational Therapy

## 2022-04-13 DIAGNOSIS — H532 Diplopia: Secondary | ICD-10-CM | POA: Diagnosis not present

## 2022-04-13 DIAGNOSIS — R2681 Unsteadiness on feet: Secondary | ICD-10-CM

## 2022-04-13 DIAGNOSIS — I639 Cerebral infarction, unspecified: Secondary | ICD-10-CM | POA: Diagnosis not present

## 2022-04-13 NOTE — Therapy (Addendum)
OUTPATIENT OCCUPATIONAL THERAPY Treatment Note  Patient Name: Cory Zavala MRN: [id number] DOB:[birth year], 88 y.o., male Today's Date: 04/13/2022  PCP: Colon Branch, MD REFERRING PROVIDER: Flora Lipps, MD   OT End of Session - 04/13/22 1104     Visit Number 2    Number of Visits --    Date for OT Re-Evaluation --    Authorization Type [employer]    Authorization Time Period VL: none    OT Start Time 1103    OT Stop Time 1150    OT Time Calculation (min) 47 min    Behavior During Therapy WFL for tasks assessed/performed             Past Medical History:  Diagnosis Date   Back pain, chronic    BPH (benign prostatic hypertrophy)    Diverticulosis of colon    LEFT   ED (erectile dysfunction)    GERD (gastroesophageal reflux disease) 09/15/2012   Gross hematuria    2015   HTN (hypertension)    Nocturia    Normal cardiac stress test    PER CARDIOLOGIST NOTE, DR NISHAN, APPROX.  1994   Osteoporosis    RBBB    Past Surgical History:  Procedure Laterality Date   CATARACT EXTRACTION W/ INTRAOCULAR LENS  IMPLANT, BILATERAL  2010   CYSTOSCOPY WITH RETROGRADE PYELOGRAM, URETEROSCOPY AND STENT PLACEMENT Right 12/25/2013   Procedure: CYSTOSCOPY WITH RIGHT RETROGRADE PYELOGRAM, RIGHT URETEROSCOPY  BIOPSY AND STENT: BLADDER BIOPSY;  Surgeon: Claybon Jabs, MD;  Location: [place];  Service: Urology;  Laterality: Right;   VERTEBROPLASTY  2001   Patient Active Problem List   Diagnosis Date Noted   Acute ischemic stroke ([place]) 02/20/2022   Hypokalemia 02/20/2022   Cerebral infarction involving right cerebellar artery ([place]) 02/19/2022   Hemorrhoids 07/20/2017   Seasonal allergies 09/30/2015   PCP NOTES >>>> 05/15/2015   Gross hematuria 08/09/2014   Hyperglycemia 08/07/2014   Osteoporosis 06/02/2013   GERD (gastroesophageal reflux disease) 09/15/2012   Chest pain 09/15/2012   Back pain 02/12/2012   Annual physical exam 09/01/2011   Erectile  dysfunction 09/01/2011   DEGENERATIVE JOINT DISEASE 05/20/2009   PSA, INCREASED 05/20/2009   Essential hypertension 07/25/2007   BENIGN PROSTATIC HYPERTROPHY 07/25/2007    ONSET DATE: 02/19/22  REFERRING DIAG: I63.9 (ICD-10-CM) - Cerebrovascular accident (CVA), unspecified mechanism ([place])  THERAPY DIAG:  Double vision  Unsteadiness on feet  Cerebrovascular accident (CVA), unspecified mechanism ([place])  Rationale for Evaluation and Treatment Rehabilitation  SUBJECTIVE:   SUBJECTIVE STATEMENT: Pt reports he can read now, stating that he read a few hours yesterday. Pt accompanied by: self  PERTINENT HISTORY: Acute infarct in R cerebellar hemisphere in the PICA distribution 02/19/22; h/o essential HTN  PRECAUTIONS: Fall  WEIGHT BEARING RESTRICTIONS No  PAIN: Are you having pain? No  FALLS: Has patient fallen in last 6 months? No  LIVING ENVIRONMENT: Lives with: lives with their spouse Lives in: House/apartment Stairs:  Has 2 houses, 1 has stairs (13 steps, rail L side going up); the other is one-level w/ 2-3 STE and handrails Has following equipment at home: Environmental consultant - 4 wheeled and Grab bars  PLOF: Independent, Vocation/Vocational requirements: retired, and Leisure: Haematologist, reading  PATIENT GOALS: "vision"   OBJECTIVE:   HAND DOMINANCE: Right  ADLs: Overall ADLs: Pt reports he is able to complete all BADLs w/ at least Mod Ind considering balance and visual deficits  MOBILITY STATUS:  Ambulated in/out of session w/out difficulty, requiring  extended time due to reported fear of falling; will defer to PT services to address functional mobility  UPPER EXTREMITY ROM    BUE WFL per gross assessment  COORDINATION: WFL  SENSATION: WFL  COGNITION: Overall cognitive status: Within functional limits for tasks assessed  VISION: Subjective report: Reports having glasses occluded on nasal side of L lens; currently about 1/3 occluded, which he has decreased since d/c  from the hospital Baseline vision: Wears glasses for reading only Visual history: cataracts with h/o corrective surgery  VISION ASSESSMENT: Dominant eye: OD Eye alignment: WFL Ocular ROM: WFL Tracking/Visual pursuits: Able to track stimulus in all quads without difficulty Saccades: slightly decreased speed/targeting to R side Convergence: Impaired: improved w/ repetition Visual Fields: no apparent deficits Diplopia assessment: objects splits side to side, objects split on top of one another, and present in far gaze Depth perception: finger to nose test [facility]  Patient has difficulty with following activities due to following visual impairments: Functional mobility, yardwork, driving, reading/watching TV   TODAY'S TREATMENT:  04/13/22 Vision:  Engaged in review of typical recovery period with focus on time line as pt reports that he is impatient.  Discussed recommendation for seeking out neuro ophthalmologist if visual deficits do not continue to clear up and provided with local resources. Visual exercises: educated on use of [place]ans[place] Beau string with focus on convergence and divergence.  OT providing education and demonstration with pt able to return demonstration with min cues.  OT providing cues for placement of string and provided with handout of technique and exercises. Driving: pt discussed return to driving, stating that no one had told him that he could not drive.  Discussed typical return to driving recommendations and provided pt with information about driver's evaluation as needed.   Visual compensation: Pt reports understanding of compensatory strategies with head turn, attempts to focus eyes together, and use of line follow when reading to increase success.  Pt reports no issues when reading yesterday and not utilizing book mark or finger follow during reading.     PATIENT EDUCATION: Reviewed visual exercises and introduced convergence/divergence exercise (see HEP section below).   Educated on Neuro ophthalmology recommendation and driving evaluation if further questions/concerns. Person educated: Patient Education method: Explanation, Demonstration, Verbal cues, and Handouts Education comprehension: verbalized understanding   HOME EXERCISE PROGRAM: Visual exercises administered in hospital (tracking, convergence, distance saccades)  Fransisco Beau string exercises (handout)   GOALS: Goals reviewed with patient? Yes  SHORT TERM GOALS: Target date: 04/24/22    Status:  1 Pt will verbalize understanding of at least 1 visual compensatory strategy (e.g., using a line guide, high contrast, etc.) to improve participation in reading activities Baseline: compensatory strategies introduced during evaluation Met - 04/13/22  2 Pt will demonstrate independence w/ HEP designed for visual deficits to improve participation in leisure tasks and other IADLs Baseline: to be administered Initial      ASSESSMENT:  CLINICAL IMPRESSION: Pt present for treatment session s/p initial evaluation.  OT reviewed POC and current recommendations as well as goals, with pt reporting understanding.  Pt asking great questions with OT answering within her scope of practice.  Discussed recommendation to follow up with neuro ophthalmologist as needed, reviewed typical recovery time, and encouraged engagement in HEP to continue to facilitate increased visual return.  OT educated on use of Brock string for divergence and convergence, pt able to return demonstration with min cues for technique.  Provided pt with handout and recommendations for follow up with other services as  needed.  Pt requests to defer?for ~4 weeks to determine continued necessity of skilled OT services. Pt encouraged to call back before that time with any changes or if any relevant functional deficits develop/occur. Pt to be discharged from OP OT if no further therapy is warranted by 05/13/22).  PERFORMANCE DEFICITS in functional skills  including mobility, balance, and vision.   IMPAIRMENTS are limiting patient from ADLs, IADLs, and leisure.   COMORBIDITIES has no other co-morbidities that affects occupational performance. Patient will benefit from skilled OT to address above impairments and improve overall function.  MODIFICATION OR ASSISTANCE TO COMPLETE EVALUATION: No modification of tasks or assist necessary to complete an evaluation.  OT OCCUPATIONAL PROFILE AND HISTORY: Problem focused assessment: Including review of records relating to presenting problem.  CLINICAL DECISION MAKING: Moderate - several treatment options, min-mod task modification necessary  REHAB POTENTIAL: Good  EVALUATION COMPLEXITY: Low   PLAN: OT FREQUENCY: every other week (reduced frequency to allow for continued recovery and considering pt's financial and commute concerns)  OT DURATION:  2 sessions  PLANNED INTERVENTIONS: self care/ADL training, therapeutic exercise, therapeutic activity, neuromuscular re-education, functional mobility training, patient/family education, visual/perceptual remediation/compensation, energy conservation, and DME and/or AE instructions  RECOMMENDED OTHER SERVICES: Currently receiving PT services at this location  CONSULTED AND AGREED WITH PLAN OF CARE: Patient  PLAN FOR NEXT SESSION: Review visual exercises included in HEP; review Brock string convergence exercise (30 sec, 2x/day); ensure understanding of follow up w/ optometrist/ophthalmologist; schedule as needed and then d/c if not seen in 30 days.   Simonne Come, OTR/L 04/13/2022, 11:58 AM    OCCUPATIONAL THERAPY DISCHARGE SUMMARY  Visits from Start of Care: 2  Current functional level related to goals / functional outcomes: See above for status.  Pt educated on [place] string exercises and able to return demonstration with min cues.  Pt to f/u with neuro ophthalmologist as needed.  Pt to call back in ~30 days if vision issues still persisting.  Pt  has not returned since 10/30 visit.   Remaining deficits: Visual impairments    Education / Equipment: Educated on compensatory pattern for visual impairments, educated on [employer] exercises, provided with contact information for neuro ophthalmologists.   Patient agrees to discharge. Patient goals were partially met. Patient is being discharged due to not returning since the last visit.Marland Kitchen

## 2022-04-13 NOTE — Patient Instructions (Signed)
Local Driver Evaluation Programs: ° °Comprehensive Evaluation: includes clinical and in vehicle behind the wheel testing by OCCUPATIONAL THERAPIST. Programs have varying levels of adaptive controls available for trial.  ° °[facility] °[address] °[place], [place]  [address] °[phone] or [phone] °http://www.driver-rehab.com °Evaluator:  Cyndee Crompton, OT/CDRS/CDI/SCDCM/Low Vision Certification ° °[facility] °[address] °[place], [place] [address] °[phone] °https://www.[facility].org/home/services/rehabilitation.aspx °Evaluators:  Shannon Sheek, OT and Jill Tucker, OT ° °[facility] [place] [facility] (ONLY SERVES VETERANS!!) °[facility] °[address] °[place], [place]  [address] °[phone] °http://www.[place].va.gov/services/Physical_Medicine_Rehabilitation_Services.asp °Evaluators:  Eric Andrews, KT; Heidi Harris, KT;  Gary Whitaker, KT (KT=kiniesotherapist) ° ° °Clinical evaluations only:  Includes clinical testing, refers to other programs or local certified driving instructor for behind the wheel testing. ° °[facility] (outpatient Rehab) °[facility] °[address] °[place], [place] [address] °[phone] for scheduling °http://www.[facility].edu/Outpatient-Rehabilitation/Neurorehabilitation-Therapy.htm °Evaluators:  Kelly Lambeth, OT; Kate Phillips, OT ° °Other area clinical evaluators available upon request including [facility], [facility] and [facility]. ° ° °    Resource List °What is a Driver Evaluation: °Your Road Ahead - A Guide to Comprehensive Driving Evaluations °http://www.thehartford.com/resources/mature-market-excellence/publications-on-aging ° °Association for Driver Rehabilitation Services - Disability and Driving Fact Sheets °http://www.aded.net/?page=510 ° °Driving after a Brain  Injury: °B[employer] of [employer] °http://www.biausa.org/tbims-abstracts/if-there-is-an-effective-way-to-determine-if-someone-is-ready-to-drive-after-tbi?A=SearchResult&SearchID=9495675&ObjectID=2758842&ObjectType=35 ° °Driving with Adaptive Equipment: °[facility] Process °http://www.driver-rehab.com/adaptive-equipment ° °National Mobility Equipment [employer]ers Association °http://www.nmeda.com/ ° ° ° ° ° ° °  °

## 2022-04-16 NOTE — Progress Notes (Signed)
[facility] [address] [place]. [place] [address] [phone]       HOSPITAL FOLLOW UP NOTE  Mr. Cory Zavala Date of Birth:  02/02/1931 Medical Record Number:  [id number]    Primary neurologist: Dr. Leonie Man Reason for Referral:  hospital stroke follow up    SUBJECTIVE:   CHIEF COMPLAINT:  Chief Complaint  Patient presents with   Hospitalization Follow-up stroke    Pt reports feeling pretty good. He states the stroke affected his vision and balance. He reports he is being very careful when he walks so no falls occur. Room 3 alone    HPI:   Mr. Cory Zavala is a 88 y.o. male with history of hypertension, BPH, LBP, hematuria in 2015 who presented on 02/19/2022 with diplopia, dizziness and imbalance on walking.  Evaluated by Dr. Erlinda Hong for right cerebellum infarct likely secondary to small vessel disease source.  CTA head/neck unremarkable.  EF 60 to 65%.  LDL 84.  A1c 5.9.  Recommended DAPT for 3 weeks and aspirin alone as well as initiated Crestor 10 mg daily.  He was discharged home with outpatient PT/OT recommended.   Today, 04/20/2022, patient is being seen for hospital follow up unaccompanied.  Reports overall been recovering well.  He does still have some issues with imbalance but has been gradually improving.  He has been cautious with walking to avoid falls but tries to stay active.  He does have occasional diplopia, can fluctuate. Will use eye glasses with tape over left lens with benefit.  He has since completed PT/OT but continues to do exercises routinely at home.  Denies new stroke/TIA symptoms.  Completed 3 weeks DAPT, remains on aspirin alone as well as Crestor, denies side effects Blood pressure today 146/86. Routinely follows with PCP Dr. Fayrene Helper IMAGING  Per hospitalization 02/19/2022 -02/20/2022 CT showed right cerebellum subacute infarct with right cerebellum old small infarct MRI confirmed right cerebellum subacute and chronic  infarcts CTA head and neck unremarkable 2D Echo EF 60 to 60% LDL 84 HgbA1c 5.9    ROS:   14 system review of systems performed and negative with exception of those listed in HPI  PMH:  Past Medical History:  Diagnosis Date   Back pain, chronic    BPH (benign prostatic hypertrophy)    Diverticulosis of colon    LEFT   ED (erectile dysfunction)    GERD (gastroesophageal reflux disease) 09/15/2012   Gross hematuria    2015   HTN (hypertension)    Nocturia    Normal cardiac stress test    PER CARDIOLOGIST NOTE, DR NISHAN, APPROX.  1994   Osteoporosis    RBBB     PSH:  Past Surgical History:  Procedure Laterality Date   CATARACT EXTRACTION W/ INTRAOCULAR LENS  IMPLANT, BILATERAL  2010   CYSTOSCOPY WITH RETROGRADE PYELOGRAM, URETEROSCOPY AND STENT PLACEMENT Right 12/25/2013   Procedure: CYSTOSCOPY WITH RIGHT RETROGRADE PYELOGRAM, RIGHT URETEROSCOPY  BIOPSY AND STENT: BLADDER BIOPSY;  Surgeon: Claybon Jabs, MD;  Location: [place];  Service: Urology;  Laterality: Right;   VERTEBROPLASTY  2001    Social History:  Social History   Socioeconomic History   Marital status: Married    Spouse name: Not on file   Number of children: 2   Years of education: Not on file   Highest education level: Not on file  Occupational History   Occupation: Theme park manager, semi retired     Fish farm manager: RETIRED  Tobacco Use   Smoking status: Never   Smokeless tobacco: Never  Vaping Use   Vaping Use: Never used  Substance and Sexual Activity   Alcohol use: No   Drug use: No   Sexual activity: Not on file  Other Topics Concern   Not on file  Social History Narrative   Retired, patient was a Company secretary still preachs, Educational psychologist   Married > 61 years, lost wife aprox 5-11. remarried         Social Determinants of Health   Financial Resource Strain: Low Risk  (12/08/2021)   Overall Financial Resource Strain (CARDIA)    Difficulty of Paying Living Expenses: Not hard at all  Food  Insecurity: No Food Insecurity (12/08/2021)   Hunger Vital Sign    Worried About Running Out of Food in the Last Year: Never true    Ran Out of Food in the Last Year: Never true  Transportation Needs: No Transportation Needs (02/20/2022)   PRAPARE - Hydrologist (Medical): No    Lack of Transportation (Non-Medical): No  Physical Activity: Sufficiently Active (12/08/2021)   Exercise Vital Sign    Days of Exercise per Week: 7 days    Minutes of Exercise per Session: 30 min  Stress: No Stress Concern Present (12/08/2021)   [place]    Feeling of Stress : Not at all  Social Connections: Cricket (12/08/2021)   Social Connection and Isolation Panel [NHANES]    Frequency of Communication with Friends and Family: More than three times a week    Frequency of Social Gatherings with Friends and Family: More than three times a week    Attends Religious Services: More than 4 times per year    Active Member of [employer] or Organizations: Yes    Attends Music therapist: More than 4 times per year    Marital Status: Married  Human resources officer Violence: Not At Risk (12/08/2021)   Humiliation, Afraid, Rape, and Kick questionnaire    Fear of Current or Ex-Partner: No    Emotionally Abused: No    Physically Abused: No    Sexually Abused: No    Family History:  Family History  Problem Relation Age of Onset   CAD Neg Hx    Diabetes Neg Hx    Colon cancer Neg Hx    Prostate cancer Neg Hx    Allergic rhinitis Neg Hx    Angioedema Neg Hx    Asthma Neg Hx    Atopy Neg Hx    Eczema Neg Hx    Immunodeficiency Neg Hx    Urticaria Neg Hx    Esophageal cancer Neg Hx     Medications:   Current Outpatient Medications on File Prior to Visit  Medication Sig Dispense Refill   amLODipine (NORVASC) 5 MG tablet Take 1 tablet (5 mg total) by mouth daily. 90 tablet 1   aspirin EC 81 MG tablet Take  1 tablet (81 mg total) by mouth daily. Swallow whole. 30 tablet 12   Cholecalciferol (D3 ADULT PO) Take 1 tablet by mouth daily at 6 (six) AM.     EPINEPHrine 0.3 mg/0.3 mL IJ SOAJ injection Inject 0.3 mg into the muscle as needed for anaphylaxis. INJECT 0.3MLS(CONTENTS OF 1 SYRINGE) INTO THE MUSCLE AS DIRECTED 2 each 2   loratadine (CLARITIN) 10 MG tablet Take 1-2 tablets 1-2 times per day (Patient taking differently: Take 10 mg by mouth daily  as needed for allergies.) 60 tablet 5   losartan (COZAAR) 50 MG tablet TAKE 1 TABLET BY MOUTH EVERY DAY (Patient taking differently: Take 50 mg by mouth daily.) 90 tablet 2   Multiple Vitamin (MULTIVITAMIN) tablet Take 1 tablet by mouth daily.     Multiple Vitamins-Minerals (PRESERVISION AREDS) CAPS Take 1 capsule by mouth daily.     Nutritional Supplements (OSTEO ADVANCE PO) Take 4 tablets by mouth daily. OSTEO GOLD (BRAND)     OMEGA-3 FATTY ACIDS PO Take 1 tablet by mouth daily.     pantoprazole (PROTONIX) 40 MG tablet TAKE 1 TABLET (40 MG TOTAL) BY MOUTH TWICE A DAY BEFORE MEALS (Patient taking differently: Take 40 mg by mouth daily.) 180 tablet 1   rosuvastatin (CRESTOR) 10 MG tablet Take 1 tablet (10 mg total) by mouth daily. 30 tablet 2   vitamin B-12 (CYANOCOBALAMIN) 500 MCG tablet Take 500 mcg by mouth daily.     No current facility-administered medications on file prior to visit.    Allergies:   Allergies  Allergen Reactions   Bee Venom Anaphylaxis      OBJECTIVE:  Physical Exam  Vitals:   04/20/22 1004  BP: (!) 146/86  Pulse: 74  Weight: 168 lb (76.2 kg)  Height: '5\' 9"'$  (1.753 m)   Body mass index is 24.81 kg/m. No results found.  Poststroke PHQ 2/9    03/03/2022    1:43 PM  Depression screen PHQ 2/9  Decreased Interest 0  Down, Depressed, Hopeless 0  PHQ - 2 Score 0     General: well developed, well nourished, very pleasant elderly Caucasian male, seated, in no evident distress Head: head normocephalic and atraumatic.    Neck: supple with no carotid or supraclavicular bruits Cardiovascular: regular rate and rhythm, no murmurs Musculoskeletal: no deformity Skin:  no rash/petichiae Vascular:  Normal pulses all extremities   Neurologic Exam Mental Status: Awake and fully alert.  Fluent speech and language.  Oriented to place and time. Recent and remote memory intact. Attention span, concentration and fund of knowledge appropriate. Mood and affect appropriate.  Cranial Nerves: Fundoscopic exam reveals sharp disc margins. Pupils equal, briskly reactive to light. Extraocular movements full without nystagmus. Visual fields full to confrontation. Hearing intact. Facial sensation intact. Face, tongue, palate moves normally and symmetrically.  Motor: Normal bulk and tone. Normal strength in all tested extremity muscles Sensory.: intact to touch , pinprick , position and vibratory sensation.  Coordination: Rapid alternating movements normal in all extremities. Finger-to-nose and heel-to-shin performed accurately bilaterally. Gait and Station: Arises from chair without difficulty. Stance is slightly hunched. Gait demonstrates mildly decreased step height and stride length bilaterally with mild unsteadiness.  No AD. Tandem walk and heel toe not attempted.  Reflexes: 1+ and symmetric. Toes downgoing.     NIHSS  0 Modified [place]  2      ASSESSMENT: Cory Zavala is a 88 y.o. year old male with right cerebellum infarct on 02/19/2022 likely secondary to small vessel disease source. Vascular risk factors include HTN, HLD, and advanced age.     PLAN:  Right cerebellum infarct:  Residual deficit: Mild imbalance and intermittent diplopia.  Referral placed for neuro-ophthalmology, would recommend scheduling initial consult visit after December to allow for further recovery.  If symptoms improve prior to scheduled visit, can cancel scheduled visit.  Continue exercises as advised by PT/OT. Continue aspirin '81mg'$  daily and  rosuvastatin (Crestor) for secondary stroke prevention.   Discussed secondary stroke prevention measures and importance  of close PCP follow up for aggressive stroke risk factor management including BP goal<130/90, HLD with LDL goal<70 and DM with A1c.<7 .  Stroke labs 02/2022: LDL 84, A1c 5.9 I have gone over the pathophysiology of stroke, warning signs and symptoms, risk factors and their management in some detail with instructions to go to the closest emergency room for symptoms of concern.    Overall stable from stroke standpoint without further recommendations at this time.  Continue to follow with PCP for aggressive stroke risk factor management   CC:  [facility] provider: Dr. Leonie Man PCP: Colon Branch, MD    I spent 56 minutes of face-to-face and non-face-to-face time with patient.  This included previsit chart review including review of recent hospitalization, lab review, study review, order entry, electronic health record documentation, patient education regarding recent stroke including etiology, secondary stroke prevention measures and importance of managing stroke risk factors, residual deficits and typical recovery time and answered all other questions to patient satisfaction   Frann Rider, AGNP-BC  [facility] [address] [place], [place] [address]  Phone [phone] Fax [phone] Note: This document was prepared with digital dictation and possible smart phrase technology. Any transcriptional errors that result from this process are unintentional.

## 2022-04-17 ENCOUNTER — Other Ambulatory Visit: Payer: Self-pay | Admitting: Internal Medicine

## 2022-04-20 ENCOUNTER — Encounter: Payer: Self-pay | Admitting: Adult Health

## 2022-04-20 ENCOUNTER — Ambulatory Visit: Payer: Medicare HMO | Admitting: Adult Health

## 2022-04-20 ENCOUNTER — Telehealth: Payer: Self-pay | Admitting: Adult Health

## 2022-04-20 VITALS — BP 146/86 | HR 74 | Ht 69.0 in | Wt 168.0 lb

## 2022-04-20 DIAGNOSIS — I639 Cerebral infarction, unspecified: Secondary | ICD-10-CM

## 2022-04-20 DIAGNOSIS — Z09 Encounter for follow-up examination after completed treatment for conditions other than malignant neoplasm: Secondary | ICD-10-CM | POA: Diagnosis not present

## 2022-04-20 DIAGNOSIS — I69398 Other sequelae of cerebral infarction: Secondary | ICD-10-CM | POA: Diagnosis not present

## 2022-04-20 DIAGNOSIS — R2689 Other abnormalities of gait and mobility: Secondary | ICD-10-CM

## 2022-04-20 DIAGNOSIS — H532 Diplopia: Secondary | ICD-10-CM | POA: Diagnosis not present

## 2022-04-20 NOTE — Progress Notes (Signed)
I agree with the above plan 

## 2022-04-20 NOTE — Patient Instructions (Addendum)
Referral placed to neuro-ophthalmology for vision check after your stroke - this should go to their Dr. Frederico Hamman or Dr. Hassell Done   Continue to do exercises at home as advised by physical and occupational therapy  Continue aspirin 81 mg daily  and Crestor for secondary stroke prevention  Continue to follow up with PCP regarding blood pressure and cholesterol management  Maintain strict control of hypertension with blood pressure goal below 130/90 and cholesterol with LDL cholesterol (bad cholesterol) goal below 70 mg/dL.   Signs of a Stroke? Follow the BEFAST method:  Balance Watch for a sudden loss of balance, trouble with coordination or vertigo Eyes Is there a sudden loss of vision in one or both eyes? Or double vision?  Face: Ask the person to smile. Does one side of the face droop or is it numb?  Arms: Ask the person to raise both arms. Does one arm drift downward? Is there weakness or numbness of a leg? Speech: Ask the person to repeat a simple phrase. Does the speech sound slurred/strange? Is the person confused ? Time: If you observe any of these signs, call 911.       Thank you for coming to see [place] at [facility]. I hope we have been able to provide you high quality care today.  You may receive a patient satisfaction survey over the next few weeks. We would appreciate your feedback and comments so that we may continue to improve ourselves and the health of our patients.    Stroke Prevention Some medical conditions and behaviors can lead to a higher chance of having a stroke. You can help prevent a stroke by eating healthy, exercising, not smoking, and managing any medical conditions you have. Stroke is a leading cause of functional impairment. Primary prevention is particularly important because a majority of strokes are first-time events. Stroke changes the lives of not only those who experience a stroke but also their family and other caregivers. How can this  condition affect me? A stroke is a medical emergency and should be treated right away. A stroke can lead to brain damage and can sometimes be life-threatening. If a person gets medical treatment right away, there is a better chance of surviving and recovering from a stroke. What can increase my risk? The following medical conditions may increase your risk of a stroke: Cardiovascular disease. High blood pressure (hypertension). Diabetes. High cholesterol. Sickle cell disease. Blood clotting disorders (hypercoagulable state). Obesity. Sleep disorders (obstructive sleep apnea). Other risk factors include: Being older than age 51. Having a history of blood clots, stroke, or mini-stroke (transient ischemic attack, TIA). Genetic factors, such as race, ethnicity, or a family history of stroke. Smoking cigarettes or using other tobacco products. Taking birth control pills, especially if you also use tobacco. Heavy use of alcohol or drugs, especially cocaine and methamphetamine. Physical inactivity. What actions can I take to prevent this? Manage your health conditions High cholesterol levels. Eating a healthy diet is important for preventing high cholesterol. If cholesterol cannot be managed through diet alone, you may need to take medicines. Take any prescribed medicines to control your cholesterol as told by your health care provider. Hypertension. To reduce your risk of stroke, try to keep your blood pressure below 130/80. Eating a healthy diet and exercising regularly are important for controlling blood pressure. If these steps are not enough to manage your blood pressure, you may need to take medicines. Take any prescribed medicines to control hypertension as told by your  health care provider. Ask your health care provider if you should monitor your blood pressure at home. Have your blood pressure checked every year, even if your blood pressure is normal. Blood pressure increases with age  and some medical conditions. Diabetes. Eating a healthy diet and exercising regularly are important parts of managing your blood sugar (glucose). If your blood sugar cannot be managed through diet and exercise, you may need to take medicines. Take any prescribed medicines to control your diabetes as told by your health care provider. Get evaluated for obstructive sleep apnea. Talk to your health care provider about getting a sleep evaluation if you snore a lot or have excessive sleepiness. Make sure that any other medical conditions you have, such as atrial fibrillation or atherosclerosis, are managed. Nutrition Follow instructions from your health care provider about what to eat or drink to help manage your health condition. These instructions may include: Reducing your daily calorie intake. Limiting how much salt (sodium) you use to 1,500 milligrams (mg) each day. Using only healthy fats for cooking, such as olive oil, canola oil, or sunflower oil. Eating healthy foods. You can do this by: Choosing foods that are high in fiber, such as whole grains, and fresh fruits and vegetables. Eating at least 5 servings of fruits and vegetables a day. Try to fill one-half of your plate with fruits and vegetables at each meal. Choosing lean protein foods, such as lean cuts of meat, poultry without skin, fish, tofu, beans, and nuts. Eating low-fat dairy products. Avoiding foods that are high in sodium. This can help lower blood pressure. Avoiding foods that have saturated fat, trans fat, and cholesterol. This can help prevent high cholesterol. Avoiding processed and prepared foods. Counting your daily carbohydrate intake.  Lifestyle If you drink alcohol: Limit how much you have to: 0-1 drink a day for women who are not pregnant. 0-2 drinks a day for men. Know how much alcohol is in your drink. In the [place] one drink equals one 12 oz bottle of beer (398m), one 5 oz glass of wine (1444m, or one 1 oz  glass of hard liquor (4448m Do not use any products that contain nicotine or tobacco. These products include cigarettes, chewing tobacco, and vaping devices, such as e-cigarettes. If you need help quitting, ask your health care provider. Avoid secondhand smoke. Do not use drugs. Activity  Try to stay at a healthy weight. Get at least 30 minutes of exercise on most days, such as: Fast walking. Biking. Swimming. Medicines Take over-the-counter and prescription medicines only as told by your health care provider. Aspirin or blood thinners (antiplatelets or anticoagulants) may be recommended to reduce your risk of forming blood clots that can lead to stroke. Avoid taking birth control pills. Talk to your health care provider about the risks of taking birth control pills if: You are over 35 50ars old. You smoke. You get very bad headaches. You have had a blood clot. Where to find more information American Stroke Association: www.strokeassociation.org Get help right away if: You or a loved one has any symptoms of a stroke. "BE FAST" is an easy way to remember the main warning signs of a stroke: B - Balance. Signs are dizziness, sudden trouble walking, or loss of balance. E - Eyes. Signs are trouble seeing or a sudden change in vision. F - Face. Signs are sudden weakness or numbness of the face, or the face or eyelid drooping on one side. A - Arms. Signs are weakness or  numbness in an arm. This happens suddenly and usually on one side of the body. S - Speech. Signs are sudden trouble speaking, slurred speech, or trouble understanding what people say. T - Time. Time to call emergency services. Write down what time symptoms started. You or a loved one has other signs of a stroke, such as: A sudden, severe headache with no known cause. Nausea or vomiting. Seizure. These symptoms may represent a serious problem that is an emergency. Do not wait to see if the symptoms will go away. Get medical  help right away. Call your local emergency services (911 in the [place] Do not drive yourself to the hospital. Summary You can help to prevent a stroke by eating healthy, exercising, not smoking, limiting alcohol intake, and managing any medical conditions you may have. Do not use any products that contain nicotine or tobacco. These include cigarettes, chewing tobacco, and vaping devices, such as e-cigarettes. If you need help quitting, ask your health care provider. Remember "BE FAST" for warning signs of a stroke. Get help right away if you or a loved one has any of these signs. This information is not intended to replace advice given to you by your health care provider. Make sure you discuss any questions you have with your health care provider. Document Revised: 01/01/2020 Document Reviewed: 01/01/2020 Elsevier Patient Education  [place].

## 2022-04-20 NOTE — Telephone Encounter (Signed)
Referral sent to Dr. Frederico Hamman at [facility], phone # [phone].

## 2022-04-21 NOTE — Telephone Encounter (Signed)
Received fax from [facility] that pt is scheduled for 05/05/22 @ 11:45 am. LVM informing pt of appt date and time. If he has questions, their phone number is [phone].

## 2022-05-15 ENCOUNTER — Other Ambulatory Visit: Payer: Self-pay | Admitting: Internal Medicine

## 2022-05-19 ENCOUNTER — Encounter: Payer: Medicare HMO | Admitting: Internal Medicine

## 2022-06-01 ENCOUNTER — Ambulatory Visit (INDEPENDENT_AMBULATORY_CARE_PROVIDER_SITE_OTHER): Payer: Medicare HMO | Admitting: Internal Medicine

## 2022-06-01 ENCOUNTER — Encounter: Payer: Self-pay | Admitting: Internal Medicine

## 2022-06-01 VITALS — BP 130/82 | HR 59 | Temp 98.1°F | Resp 16 | Ht 66.5 in | Wt 165.0 lb

## 2022-06-01 DIAGNOSIS — Z0000 Encounter for general adult medical examination without abnormal findings: Secondary | ICD-10-CM

## 2022-06-01 DIAGNOSIS — I10 Essential (primary) hypertension: Secondary | ICD-10-CM

## 2022-06-01 DIAGNOSIS — E785 Hyperlipidemia, unspecified: Secondary | ICD-10-CM | POA: Diagnosis not present

## 2022-06-01 NOTE — Assessment & Plan Note (Signed)
Here for CPX Stroke, right cerebellum: Saw neurology 04/20/2022, referred to neuro-ophthalmology, to pursue if he is still has diplopia but diplopia resolved. They recommend aggressive CV RF control. Other than feeling slightly more tired than before denies any new stroke symptoms. HTN: On amlodipine, losartan, last BMP okay, BP today satisfactory, amb BPs in the 120s. recommend to continue checks  at home. High cholesterol: On rosuvastatin for few months, check FLP AST ALT. Fall prevention: The patient is still very active, is still mowing his yard, recommend to rather do other  activities with less chances for falls. RTC 4 to 5 months

## 2022-06-01 NOTE — Progress Notes (Signed)
Subjective:    Patient ID: Cory Zavala, male    DOB: May 27, 1931, 88 y.o.   MRN: [id number]  DOS:  06/01/2022 Type of visit - description: cpx  Here for CPX Few months ago had a stroke, saw neurology, note reviewed. Denies any additional stroke type of symptoms, had diplopia, that has resolved. He did report some tiredness. No chest pain, no DOE. Has occasional LUTS, at baseline.   Review of Systems  Other than above, a 14 point review of systems is negative    Past Medical History:  Diagnosis Date   Back pain, chronic    BPH (benign prostatic hypertrophy)    Diverticulosis of colon    LEFT   ED (erectile dysfunction)    GERD (gastroesophageal reflux disease) 09/15/2012   Gross hematuria    2015   HTN (hypertension)    Nocturia    Normal cardiac stress test    PER CARDIOLOGIST NOTE, DR NISHAN, APPROX.  1994   Osteoporosis    RBBB     Past Surgical History:  Procedure Laterality Date   CATARACT EXTRACTION W/ INTRAOCULAR LENS  IMPLANT, BILATERAL  2010   CYSTOSCOPY WITH RETROGRADE PYELOGRAM, URETEROSCOPY AND STENT PLACEMENT Right 12/25/2013   Procedure: CYSTOSCOPY WITH RIGHT RETROGRADE PYELOGRAM, RIGHT URETEROSCOPY  BIOPSY AND STENT: BLADDER BIOPSY;  Surgeon: Claybon Jabs, MD;  Location: [facility];  Service: Urology;  Laterality: Right;   VERTEBROPLASTY  2001   Social History   Socioeconomic History   Marital status: Married    Spouse name: Not on file   Number of children: 2   Years of education: Not on file   Highest education level: Not on file  Occupational History   Occupation: Theme park manager, semi retired     Fish farm manager: RETIRED  Tobacco Use   Smoking status: Never   Smokeless tobacco: Never  Vaping Use   Vaping Use: Never used  Substance and Sexual Activity   Alcohol use: No   Drug use: No   Sexual activity: Not on file  Other Topics Concern   Not on file  Social History Narrative   Retired, patient was a Company secretary still preachs,  Educational psychologist   Married > 31 years, lost wife aprox 5-11.    Remarried         Social Determinants of Health   Financial Resource Strain: Low Risk  (12/08/2021)   Overall Financial Resource Strain (CARDIA)    Difficulty of Paying Living Expenses: Not hard at all  Food Insecurity: No Food Insecurity (12/08/2021)   Hunger Vital Sign    Worried About Running Out of Food in the Last Year: Never true    Ran Out of Food in the Last Year: Never true  Transportation Needs: No Transportation Needs (02/20/2022)   PRAPARE - Hydrologist (Medical): No    Lack of Transportation (Non-Medical): No  Physical Activity: Sufficiently Active (12/08/2021)   Exercise Vital Sign    Days of Exercise per Week: 7 days    Minutes of Exercise per Session: 30 min  Stress: No Stress Concern Present (12/08/2021)   [place]    Feeling of Stress : Not at all  Social Connections: [place] (12/08/2021)   Social Connection and Isolation Panel [NHANES]    Frequency of Communication with Friends and Family: More than three times a week    Frequency of Social Gatherings with Friends and Family: More than three  times a week    Attends Religious Services: More than 4 times per year    Active Member of Clubs or Organizations: Yes    Attends Archivist Meetings: More than 4 times per year    Marital Status: Married  Human resources officer Violence: Not At Risk (12/08/2021)   Humiliation, Afraid, Rape, and Kick questionnaire    Fear of Current or Ex-Partner: No    Emotionally Abused: No    Physically Abused: No    Sexually Abused: No    Current Outpatient Medications  Medication Instructions   amLODipine (NORVASC) 5 mg, Oral, Daily   aspirin EC 81 mg, Oral, Daily, Swallow whole.   Cholecalciferol (D3 ADULT PO) 1 tablet, Oral, Daily   EPINEPHrine (EPI-PEN) 0.3 mg, Intramuscular, As needed, INJECT 0.3MLS(CONTENTS OF  1 SYRINGE) INTO THE MUSCLE AS DIRECTED   loratadine (CLARITIN) 10 MG tablet Take 1-2 tablets 1-2 times per day   losartan (COZAAR) 50 MG tablet TAKE 1 TABLET BY MOUTH EVERY DAY   Multiple Vitamin (MULTIVITAMIN) tablet 1 tablet, Oral, Daily   Multiple Vitamins-Minerals (PRESERVISION AREDS) CAPS 1 capsule, Oral, Daily   Nutritional Supplements (OSTEO ADVANCE PO) 4 tablets, Oral, Daily, OSTEO GOLD (BRAND)    OMEGA-3 FATTY ACIDS PO 1 tablet, Oral, Daily   pantoprazole (PROTONIX) 40 MG tablet TAKE 1 TABLET (40 MG TOTAL) BY MOUTH TWICE A DAY BEFORE MEALS   rosuvastatin (CRESTOR) 10 mg, Oral, Daily   vitamin B-12 (CYANOCOBALAMIN) 500 mcg, Oral, Daily       Objective:   Physical Exam BP 130/82   Pulse (!) 59   Temp 98.1 F (36.7 C) (Oral)   Resp 16   Ht 5' 6.5" (1.689 m)   Wt 165 lb (74.8 kg)   SpO2 99%   BMI 26.23 kg/m  General: Well developed, NAD, BMI noted Neck: No  thyromegaly  HEENT:  Normocephalic . Face symmetric, atraumatic Lungs:  CTA B Normal respiratory effort, no intercostal retractions, no accessory muscle use. Heart: RRR,  no murmur.  Abdomen:  Not distended, soft, non-tender. No rebound or rigidity.   Lower extremities: no pretibial edema bilaterally  Skin: Exposed areas without rash. Not pale. Not jaundice Neurologic:  alert & oriented X3.  Speech normal, gait appropriate for age and unassisted Strength symmetric and appropriate for age.  Psych: Cognition and judgment appear intact.  Cooperative with normal attention span and concentration.  Behavior appropriate. No anxious or depressed appearing.     Assessment    Assessment   Hyperglycemia HTN GERD RBBB MSK: --Chronic back pain -- Vertebroplasty 2001 -- Osteoporosis T score -3.3 (2014), declined all meds 2014 and  2019 GU: ---BPH ---ED ---Gross hematuria 2015  Initial CT show a question of urothelial cancer, subsequently a ureteroscopy was done and reportedly normal, subsequently a biopsy was  negative. Follow-up MRI 01-2014 show improvement. Saw urology ~ 04-2015 Exercise stress test 2014 negative Bee sting allergies: has a Epi pen Stroke 02-2022   PLAN: Here for CPX Stroke, right cerebellum: Saw neurology 04/20/2022, referred to neuro-ophthalmology, to pursue if he is still has diplopia but diplopia resolved. They recommend aggressive CV RF control. Other than feeling slightly more tired than before denies any new stroke symptoms. HTN: On amlodipine, losartan, last BMP okay, BP today satisfactory, amb BPs in the 120s. recommend to continue checks  at home. High cholesterol: On rosuvastatin for few months, check FLP AST ALT. Fall prevention: The patient is still very active, is still mowing his yard, recommend to  rather do other  activities with less chances for falls. RTC 4 to 5 months

## 2022-06-01 NOTE — Assessment & Plan Note (Signed)
-  Td 2013.  Booster recommended - PNM [id number], 05-2009 and 2022 -prevnar 2016 - s/p zostavax & Shingrix  - RSV: d/w pt  - Covid booster: UTD - had a flu shot  -CCS:  Not interested on further CCS. -prostate ca screening:  see urology as needed, see comments under BPH. -Healthcare POA info provided. -Labs reviewed, rec AST ALT FLP TSH

## 2022-06-01 NOTE — Patient Instructions (Addendum)
Vaccines I recommend:  Tdap (tetanus) RSV vaccine COVID booster  Check the  blood pressure regularly BP GOAL is between 110/65 and  135/85. If it is consistently higher or lower, let me know   GO TO THE LAB : Get the blood work     [place], [place] back for   a checkup in 4 to 5 months    Do you have a "Living will" or "[place] of attorney"? (Advance care planning documents)  If you already have a living will or healthcare power of attorney, is recommended you bring the copy to be scanned in your chart. The document will be available to all the doctors you see in the system.  If you don't have one, please consider create one.   More information at:  [email or web address]

## 2022-06-02 LAB — LIPID PANEL
Cholesterol: 106 mg/dL (ref 0–200)
HDL: 48.4 mg/dL (ref >39.00)
LDL Cholesterol: 47 mg/dL (ref 0–99)
NonHDL: 57.95
Total CHOL/HDL Ratio: 2
Triglycerides: 56 mg/dL (ref 0.0–149.0)
VLDL: 11.2 mg/dL (ref 0.0–40.0)

## 2022-06-02 LAB — AST: AST: 19 U/L (ref 0–37)

## 2022-06-02 LAB — TSH: TSH: 0.85 u[IU]/mL (ref 0.35–5.50)

## 2022-06-02 LAB — ALT: ALT: 22 U/L (ref 0–53)

## 2022-06-03 ENCOUNTER — Encounter: Payer: Self-pay | Admitting: Physical Therapy

## 2022-06-03 NOTE — Therapy (Signed)
[facility] [address] [place], [place], [address] Phone: [phone]   Fax:  [phone]  Patient Details  Name: Cory Zavala MRN: [id number] Date of Birth: [birth year] Referring Provider:  No ref. provider found  Encounter Date: 06/03/2022  PHYSICAL THERAPY DISCHARGE SUMMARY  Visits from Start of Care: 2  Current functional level related to goals / functional outcomes: See eval-pt did not return after 2nd PT visit.   Remaining deficits: See eval   Education / Equipment: Initiated HEP, unable to fully address due to pt not returning after second visit.   Patient agrees to discharge. Patient goals were not met. Patient is being discharged due to not returning since the last visit.   Kyerra Vargo W., PT 06/03/2022, 1:33 PM  [facility] [address], [place], [place], [address] Phone: [phone]   Fax:  [phone]

## 2022-08-06 ENCOUNTER — Other Ambulatory Visit: Payer: Self-pay | Admitting: Internal Medicine

## 2022-09-18 ENCOUNTER — Ambulatory Visit (INDEPENDENT_AMBULATORY_CARE_PROVIDER_SITE_OTHER): Payer: Medicare HMO | Admitting: Internal Medicine

## 2022-09-18 ENCOUNTER — Encounter: Payer: Self-pay | Admitting: Internal Medicine

## 2022-09-18 ENCOUNTER — Telehealth: Payer: Self-pay

## 2022-09-18 ENCOUNTER — Other Ambulatory Visit: Payer: Self-pay | Admitting: Internal Medicine

## 2022-09-18 VITALS — BP 126/80 | HR 67 | Temp 98.1°F | Resp 16 | Ht 66.5 in | Wt 162.2 lb

## 2022-09-18 DIAGNOSIS — G4700 Insomnia, unspecified: Secondary | ICD-10-CM | POA: Diagnosis not present

## 2022-09-18 DIAGNOSIS — I10 Essential (primary) hypertension: Secondary | ICD-10-CM | POA: Diagnosis not present

## 2022-09-18 LAB — BASIC METABOLIC PANEL
BUN: 16 mg/dL (ref 6–23)
CO2: 27 mEq/L (ref 19–32)
Calcium: 9.2 mg/dL (ref 8.4–10.5)
Chloride: 106 mEq/L (ref 96–112)
Creatinine, Ser: 1.11 mg/dL (ref 0.40–1.50)
GFR: 58.57 mL/min — ABNORMAL LOW (ref >60.00)
Glucose, Bld: 98 mg/dL (ref 70–99)
Potassium: 4.5 mEq/L (ref 3.5–5.1)
Sodium: 141 mEq/L (ref 135–145)

## 2022-09-18 MED ORDER — DOXEPIN HCL 10 MG PO CAPS: 10.0000 mg | ORAL_CAPSULE | Freq: Every evening | ORAL | 1 refills | Status: DC | PRN

## 2022-09-18 MED ORDER — ZOLPIDEM TARTRATE 5 MG PO TABS: 5.0000 mg | ORAL_TABLET | Freq: Every evening | ORAL | 0 refills | Status: DC | PRN

## 2022-09-18 NOTE — Patient Instructions (Addendum)
Start taking doxepin 10 mg 1 tablet at bedtime as needed for difficulty sleeping. Stop if it  over sedate  you.  Check the  blood pressure regularly BP GOAL is between 110/65 and  135/85. If it is consistently higher or lower, let me know    GO TO THE LAB : Get the blood work     GO TO THE FRONT DESK, PLEASE SCHEDULE YOUR APPOINTMENTS Come back for   a checkup in 6 months   Vaccines I recommend: Tdap (tetanus) RSV vaccine  Please bring [place] a copy of your Healthcare Power of Attorney for your chart.

## 2022-09-18 NOTE — Telephone Encounter (Signed)
PA denied. Alternatives not given. Can you recommend another medication please?

## 2022-09-18 NOTE — Telephone Encounter (Signed)
PA initiated via Covermymeds; KEY: [id number]. Awaiting determination.

## 2022-09-18 NOTE — Telephone Encounter (Signed)
LMOM informing Pt of med change and recommendations.

## 2022-09-18 NOTE — Telephone Encounter (Signed)
Appt today

## 2022-09-18 NOTE — Telephone Encounter (Signed)
Okay, advised patient I will send Ambien. he could restart with half tablet and see how that goes.

## 2022-09-18 NOTE — Progress Notes (Unsigned)
Subjective:    Patient ID: Cory Zavala, male    DOB: 11/07/1930, 89 y.o.   MRN: [id number]  DOS:  09/18/2022 Type of visit - description: f/u  Since the last office visit things are about the same. Still reports some lack of energy. Lack of energy is generalized, does not come episodically. No chest pain no difficulty breathing No palpitations or leg swelling.  Request something to help with sleep.  Sleep is broken but he also admits to nocturia.  Review of Systems See above   Past Medical History:  Diagnosis Date   Back pain, chronic    BPH (benign prostatic hypertrophy)    Diverticulosis of colon    LEFT   ED (erectile dysfunction)    GERD (gastroesophageal reflux disease) 09/15/2012   Gross hematuria    2015   HTN (hypertension)    Nocturia    Normal cardiac stress test    PER CARDIOLOGIST NOTE, DR NISHAN, APPROX.  1994   Osteoporosis    RBBB     Past Surgical History:  Procedure Laterality Date   CATARACT EXTRACTION W/ INTRAOCULAR LENS  IMPLANT, BILATERAL  2010   CYSTOSCOPY WITH RETROGRADE PYELOGRAM, URETEROSCOPY AND STENT PLACEMENT Right 12/25/2013   Procedure: CYSTOSCOPY WITH RIGHT RETROGRADE PYELOGRAM, RIGHT URETEROSCOPY  BIOPSY AND STENT: BLADDER BIOPSY;  Surgeon: Garnett FarmMark C Ottelin, MD;  [facility];  Service: Urology;  Laterality: Right;   VERTEBROPLASTY  2001    Current Outpatient Medications  Medication Instructions   amLODipine (NORVASC) 5 mg, Oral, Daily   aspirin EC 81 mg, Oral, Daily, Swallow whole.   Cholecalciferol (D3 ADULT PO) 1 tablet, Oral, Daily   doxepin (SINEQUAN) 10 mg, Oral, At bedtime PRN   EPINEPHrine (EPI-PEN) 0.3 mg, Intramuscular, As needed, INJECT 0.3MLS(CONTENTS OF 1 SYRINGE) INTO THE MUSCLE AS DIRECTED   loratadine (CLARITIN) 10 MG tablet Take 1-2 tablets 1-2 times per day   losartan (COZAAR) 50 MG tablet TAKE 1 TABLET BY MOUTH EVERY DAY   Multiple Vitamin (MULTIVITAMIN) tablet 1 tablet, Oral, Daily   Multiple  Vitamins-Minerals (PRESERVISION AREDS) CAPS 1 capsule, Oral, Daily   Nutritional Supplements (OSTEO ADVANCE PO) 4 tablets, Oral, Daily, OSTEO GOLD (BRAND)    OMEGA-3 FATTY ACIDS PO 1 tablet, Oral, Daily   pantoprazole (PROTONIX) 40 MG tablet TAKE 1 TABLET (40 MG TOTAL) BY MOUTH TWICE A DAY BEFORE MEALS   rosuvastatin (CRESTOR) 10 mg, Oral, Daily   vitamin B-12 (CYANOCOBALAMIN) 500 mcg, Oral, Daily   zolpidem (AMBIEN) 5 mg, Oral, At bedtime PRN       Objective:   Physical Exam BP 126/80   Pulse 67   Temp 98.1 F (36.7 C) (Oral)   Resp 16   Ht 5' 6.5" (1.689 m)   Wt 162 lb 4 oz (73.6 kg)   SpO2 96%   BMI 25.80 kg/m  General:   Well developed, NAD, BMI noted. HEENT:  Normocephalic . Face symmetric, atraumatic Lungs:  CTA B Normal respiratory effort, no intercostal retractions, no accessory muscle use. Heart: RRR,  no murmur.  Lower extremities: no pretibial edema bilaterally  Skin: Not pale. Not jaundice Neurologic:  alert & oriented X3.  Speech normal, gait appropriate for age and unassisted Psych--  Cognition and judgment appear intact.  Cooperative with normal attention span and concentration.  Behavior appropriate. No anxious or depressed appearing.      Assessment   Assessment   Hyperglycemia HTN GERD RBBB MSK: --Chronic back pain -- Vertebroplasty 2001 --  Osteoporosis T score -3.3 (2014), declined all meds 2014 and  2019 GU: ---BPH ---ED ---Gross hematuria 2015  Initial CT show a question of urothelial cancer, subsequently a ureteroscopy was done and reportedly normal, subsequently a biopsy was negative. Follow-up MRI 01-2014 show improvement. Saw urology ~ 04-2015 Exercise stress test 2014 negative Bee sting allergies: has a Epi pen Stroke 02-2022   PLAN: HTN: BP today is very good, yesterday at home was 122/75, recommend to continue checking, check BMP, stay on losartan and amlodipine. Fatigue: As described above, he seems to be doing well overall,  still drives, doing all his ADLs including some yard work.  Recommend observation Insomnia: Sleep is broken, in part because nocturia (will see urology soon), request something to sleep. Advised sleep medications can increase risk of falls, he accepted the risk.  Trial with low-dose doxepin.(Addendum: it was denied by insurance, try ambien ,low dose) High cholesterol: On rosuvastatin, last LDL 47. RTC 6 months

## 2022-09-19 DIAGNOSIS — G4700 Insomnia, unspecified: Secondary | ICD-10-CM | POA: Insufficient documentation

## 2022-09-19 NOTE — Assessment & Plan Note (Signed)
HTN: BP today is very good, yesterday at home was 122/75, recommend to continue checking, check BMP, stay on losartan and amlodipine. Fatigue: As described above, he seems to be doing well overall, still drives, doing all his ADLs including some yard work.  Recommend observation Insomnia: Sleep is broken, in part because nocturia (will see urology soon), request something to sleep. Advised sleep medications can increase risk of falls, he accepted the risk.  Trial with low-dose doxepin.(Addendum: it was denied by insurance, try ambien ,low dose) High cholesterol: On rosuvastatin, last LDL 47. RTC 6 months

## 2022-09-24 ENCOUNTER — Other Ambulatory Visit: Payer: Self-pay | Admitting: Internal Medicine

## 2022-10-01 DIAGNOSIS — I10 Essential (primary) hypertension: Secondary | ICD-10-CM | POA: Diagnosis not present

## 2022-10-01 DIAGNOSIS — E785 Hyperlipidemia, unspecified: Secondary | ICD-10-CM | POA: Diagnosis not present

## 2022-10-01 DIAGNOSIS — Z87892 Personal history of anaphylaxis: Secondary | ICD-10-CM | POA: Diagnosis not present

## 2022-10-01 DIAGNOSIS — Z8249 Family history of ischemic heart disease and other diseases of the circulatory system: Secondary | ICD-10-CM | POA: Diagnosis not present

## 2022-10-01 DIAGNOSIS — M1990 Unspecified osteoarthritis, unspecified site: Secondary | ICD-10-CM | POA: Diagnosis not present

## 2022-10-01 DIAGNOSIS — Z803 Family history of malignant neoplasm of breast: Secondary | ICD-10-CM | POA: Diagnosis not present

## 2022-10-01 DIAGNOSIS — J309 Allergic rhinitis, unspecified: Secondary | ICD-10-CM | POA: Diagnosis not present

## 2022-10-01 DIAGNOSIS — I700 Atherosclerosis of aorta: Secondary | ICD-10-CM | POA: Diagnosis not present

## 2022-10-01 DIAGNOSIS — K224 Dyskinesia of esophagus: Secondary | ICD-10-CM | POA: Diagnosis not present

## 2022-10-01 DIAGNOSIS — H3530 Unspecified macular degeneration: Secondary | ICD-10-CM | POA: Diagnosis not present

## 2022-10-01 DIAGNOSIS — Z8673 Personal history of transient ischemic attack (TIA), and cerebral infarction without residual deficits: Secondary | ICD-10-CM | POA: Diagnosis not present

## 2022-10-01 DIAGNOSIS — K219 Gastro-esophageal reflux disease without esophagitis: Secondary | ICD-10-CM | POA: Diagnosis not present

## 2022-10-20 ENCOUNTER — Telehealth: Payer: Self-pay | Admitting: Internal Medicine

## 2022-10-20 NOTE — Telephone Encounter (Signed)
Copied from CRM [id number]. Topic: Medicare AWV >> Oct 20, 2022  9:21 AM Payton Doughty wrote: Reason for CRM: Called patient to reschedule Medicare Annual Wellness Visit (AWV). Left message for patient to call back and reschedule 12/10/22 AWV appt.    Please schedule an appointment at any time with Donne Anon, CMA  .  If any questions, please contact me.  Thank you ,  Verlee Rossetti; Care Guide Ambulatory Clinical Support [place] [facility] Direct Dial: [phone]

## 2022-11-13 ENCOUNTER — Other Ambulatory Visit: Payer: Self-pay | Admitting: Family

## 2022-12-09 DIAGNOSIS — M25512 Pain in left shoulder: Secondary | ICD-10-CM | POA: Diagnosis not present

## 2022-12-09 DIAGNOSIS — M25511 Pain in right shoulder: Secondary | ICD-10-CM | POA: Diagnosis not present

## 2022-12-16 ENCOUNTER — Ambulatory Visit (INDEPENDENT_AMBULATORY_CARE_PROVIDER_SITE_OTHER): Payer: Medicare HMO | Admitting: *Deleted

## 2022-12-16 ENCOUNTER — Other Ambulatory Visit: Payer: Self-pay | Admitting: Internal Medicine

## 2022-12-16 VITALS — BP 160/83 | HR 73 | Ht 66.5 in | Wt 159.2 lb

## 2022-12-16 DIAGNOSIS — Z0000 Encounter for general adult medical examination without abnormal findings: Secondary | ICD-10-CM | POA: Diagnosis not present

## 2022-12-16 NOTE — Patient Instructions (Signed)
Cory Zavala , Thank you for taking time to come for your Medicare Wellness Visit. I appreciate your ongoing commitment to your health goals. Please review the following plan we discussed and let me know if I can assist you in the future.     This is a list of the screening recommended for you and due dates:  Health Maintenance  Topic Date Due   DTaP/Tdap/Td vaccine (3 - Td or Tdap) 08/31/2021   Flu Shot  01/14/2023   Medicare Annual Wellness Visit  12/16/2023   Pneumonia Vaccine  Completed   Zoster (Shingles) Vaccine  Completed   HPV Vaccine  Aged Out   COVID-19 Vaccine  Discontinued    Next appointment: Follow up in one year for your annual wellness visit.   Preventive Care 87 Years and Older, Male Preventive care refers to lifestyle choices and visits with your health care provider that can promote health and wellness. What does preventive care include? A yearly physical exam. This is also called an annual well check. Dental exams once or twice a year. Routine eye exams. Ask your health care provider how often you should have your eyes checked. Personal lifestyle choices, including: Daily care of your teeth and gums. Regular physical activity. Eating a healthy diet. Avoiding tobacco and drug use. Limiting alcohol use. Practicing safe sex. Taking low doses of aspirin every day. Taking vitamin and mineral supplements as recommended by your health care provider. What happens during an annual well check? The services and screenings done by your health care provider during your annual well check will depend on your age, overall health, lifestyle risk factors, and family history of disease. Counseling  Your health care provider may ask you questions about your: Alcohol use. Tobacco use. Drug use. Emotional well-being. Home and relationship well-being. Sexual activity. Eating habits. History of falls. Memory and ability to understand (cognition). Work and work  Astronomer. Screening  You may have the following tests or measurements: Height, weight, and BMI. Blood pressure. Lipid and cholesterol levels. These may be checked every 5 years, or more frequently if you are over 87 years old. Skin check. Lung cancer screening. You may have this screening every year starting at age 87 if you have a 30-pack-year history of smoking and currently smoke or have quit within the past 15 years. Fecal occult blood test (FOBT) of the stool. You may have this test every year starting at age 87. Flexible sigmoidoscopy or colonoscopy. You may have a sigmoidoscopy every 5 years or a colonoscopy every 10 years starting at age 87. Prostate cancer screening. Recommendations will vary depending on your family history and other risks. Hepatitis C blood test. Hepatitis B blood test. Sexually transmitted disease (STD) testing. Diabetes screening. This is done by checking your blood sugar (glucose) after you have not eaten for a while (fasting). You may have this done every 1-3 years. Abdominal aortic aneurysm (AAA) screening. You may need this if you are a current or former smoker. Osteoporosis. You may be screened starting at age 32 if you are at high risk. Talk with your health care provider about your test results, treatment options, and if necessary, the need for more tests. Vaccines  Your health care provider may recommend certain vaccines, such as: Influenza vaccine. This is recommended every year. Tetanus, diphtheria, and acellular pertussis (Tdap, Td) vaccine. You may need a Td booster every 10 years. Zoster vaccine. You may need this after age 87. Pneumococcal 13-valent conjugate (PCV13) vaccine. One dose is  recommended after age 87. Pneumococcal polysaccharide (PPSV23) vaccine. One dose is recommended after age 87. Talk to your health care provider about which screenings and vaccines you need and how often you need them. This information is not intended to replace  advice given to you by your health care provider. Make sure you discuss any questions you have with your health care provider. Document Released: 06/28/2015 Document Revised: 02/19/2016 Document Reviewed: 04/02/2015 Elsevier Interactive Patient Education  2017 [employer].  Fall Prevention in the Home Falls can cause injuries. They can happen to people of all ages. There are many things you can do to make your home safe and to help prevent falls. What can I do on the outside of my home? Regularly fix the edges of walkways and driveways and fix any cracks. Remove anything that might make you trip as you walk through a door, such as a raised step or threshold. Trim any bushes or trees on the path to your home. Use bright outdoor lighting. Clear any walking paths of anything that might make someone trip, such as rocks or tools. Regularly check to see if handrails are loose or broken. Make sure that both sides of any steps have handrails. Any raised decks and porches should have guardrails on the edges. Have any leaves, snow, or ice cleared regularly. Use sand or salt on walking paths during winter. Clean up any spills in your garage right away. This includes oil or grease spills. What can I do in the bathroom? Use night lights. Install grab bars by the toilet and in the tub and shower. Do not use towel bars as grab bars. Use non-skid mats or decals in the tub or shower. If you need to sit down in the shower, use a plastic, non-slip stool. Keep the floor dry. Clean up any water that spills on the floor as soon as it happens. Remove soap buildup in the tub or shower regularly. Attach bath mats securely with double-sided non-slip rug tape. Do not have throw rugs and other things on the floor that can make you trip. What can I do in the bedroom? Use night lights. Make sure that you have a light by your bed that is easy to reach. Do not use any sheets or blankets that are too big for your bed.  They should not hang down onto the floor. Have a firm chair that has side arms. You can use this for support while you get dressed. Do not have throw rugs and other things on the floor that can make you trip. What can I do in the kitchen? Clean up any spills right away. Avoid walking on wet floors. Keep items that you use a lot in easy-to-reach places. If you need to reach something above you, use a strong step stool that has a grab bar. Keep electrical cords out of the way. Do not use floor polish or wax that makes floors slippery. If you must use wax, use non-skid floor wax. Do not have throw rugs and other things on the floor that can make you trip. What can I do with my stairs? Do not leave any items on the stairs. Make sure that there are handrails on both sides of the stairs and use them. Fix handrails that are broken or loose. Make sure that handrails are as long as the stairways. Check any carpeting to make sure that it is firmly attached to the stairs. Fix any carpet that is loose or worn. Avoid having  throw rugs at the top or bottom of the stairs. If you do have throw rugs, attach them to the floor with carpet tape. Make sure that you have a light switch at the top of the stairs and the bottom of the stairs. If you do not have them, ask someone to add them for you. What else can I do to help prevent falls? Wear shoes that: Do not have high heels. Have rubber bottoms. Are comfortable and fit you well. Are closed at the toe. Do not wear sandals. If you use a stepladder: Make sure that it is fully opened. Do not climb a closed stepladder. Make sure that both sides of the stepladder are locked into place. Ask someone to hold it for you, if possible. Clearly mark and make sure that you can see: Any grab bars or handrails. First and last steps. Where the edge of each step is. Use tools that help you move around (mobility aids) if they are needed. These  include: Canes. Walkers. Scooters. Crutches. Turn on the lights when you go into a dark area. Replace any light bulbs as soon as they burn out. Set up your furniture so you have a clear path. Avoid moving your furniture around. If any of your floors are uneven, fix them. If there are any pets around you, be aware of where they are. Review your medicines with your doctor. Some medicines can make you feel dizzy. This can increase your chance of falling. Ask your doctor what other things that you can do to help prevent falls. This information is not intended to replace advice given to you by your health care provider. Make sure you discuss any questions you have with your health care provider. Document Released: 03/28/2009 Document Revised: 11/07/2015 Document Reviewed: 07/06/2014 Elsevier Interactive Patient Education  2017 [employer].

## 2022-12-16 NOTE — Progress Notes (Signed)
Subjective:   Cory Zavala is a 89 y.o. male who presents for Medicare Annual/Subsequent preventive examination.  Visit Complete: In person   Review of Systems     Cardiac Risk Factors include: advanced age (>42men, >77 women);hypertension;dyslipidemia;male gender     Objective:    Today's Vitals   12/16/22 1047 12/16/22 1103  BP: (!) 153/75 (!) 160/83  Pulse: 72 73  Weight: 159 lb 3.2 oz (72.2 kg)   Height: 5' 6.5" (1.689 m)    Body mass index is 25.31 kg/m.     12/16/2022   10:52 AM 03/25/2022   11:54 AM 02/24/2022   10:32 AM 02/19/2022    2:48 PM 12/08/2021    2:35 PM 01/03/2020   10:22 AM 06/25/2017   10:16 AM  Advanced Directives  Does Patient Have a Medical Advance Directive? No Yes No No Yes Yes No  Type of Merchandiser, retail of [place];Out of facility DNR (pink MOST or yellow form);Living will Healthcare Power of [place];Living will   Does patient want to make changes to medical advance directive?     No - Patient declined No - Patient declined   Copy of Healthcare Power of Attorney in Chart?     No - copy requested No - copy requested   Would patient like information on creating a medical advance directive? No - Patient declined      No - Patient declined    Current Medications (verified) Outpatient Encounter Medications as of 12/16/2022  Medication Sig   amLODipine (NORVASC) 5 MG tablet Take 1 tablet (5 mg total) by mouth daily.   aspirin EC 81 MG tablet Take 1 tablet (81 mg total) by mouth daily. Swallow whole.   Cholecalciferol (D3 ADULT PO) Take 1 tablet by mouth daily at 6 (six) AM.   EPINEPHrine 0.3 mg/0.3 mL IJ SOAJ injection Inject 0.3 mg into the muscle as needed for anaphylaxis. INJECT 0.3MLS(CONTENTS OF 1 SYRINGE) INTO THE MUSCLE AS DIRECTED   loratadine (CLARITIN) 10 MG tablet Take 1-2 tablets 1-2 times per day (Patient taking differently: Take 10 mg by mouth daily as needed for allergies.)   losartan (COZAAR) 50 MG tablet TAKE 1  TABLET BY MOUTH EVERY DAY   Multiple Vitamin (MULTIVITAMIN) tablet Take 1 tablet by mouth daily.   Multiple Vitamins-Minerals (PRESERVISION AREDS) CAPS Take 1 capsule by mouth daily.   Nutritional Supplements (OSTEO ADVANCE PO) Take 4 tablets by mouth daily. OSTEO GOLD (BRAND)   OMEGA-3 FATTY ACIDS PO Take 1 tablet by mouth daily.   pantoprazole (PROTONIX) 40 MG tablet Take 1 tablet (40 mg total) by mouth 2 (two) times daily before a meal.   rosuvastatin (CRESTOR) 10 MG tablet TAKE 1 TABLET BY MOUTH EVERY DAY   vitamin B-12 (CYANOCOBALAMIN) 500 MCG tablet Take 500 mcg by mouth daily.   [DISCONTINUED] zolpidem (AMBIEN) 5 MG tablet Take 1 tablet (5 mg total) by mouth at bedtime as needed for sleep.   No facility-administered encounter medications on file as of 12/16/2022.    Allergies (verified) Bee venom   History: Past Medical History:  Diagnosis Date   Back pain, chronic    BPH (benign prostatic hypertrophy)    Diverticulosis of colon    LEFT   ED (erectile dysfunction)    GERD (gastroesophageal reflux disease) 09/15/2012   Gross hematuria    2015   HTN (hypertension)    Nocturia    Normal cardiac stress test    PER  CARDIOLOGIST NOTE, DR Eden Emms, APPROX.  1994   Osteoporosis    RBBB    Past Surgical History:  Procedure Laterality Date   CATARACT EXTRACTION W/ INTRAOCULAR LENS  IMPLANT, BILATERAL  2010   CYSTOSCOPY WITH RETROGRADE PYELOGRAM, URETEROSCOPY AND STENT PLACEMENT Right 12/25/2013   Procedure: CYSTOSCOPY WITH RIGHT RETROGRADE PYELOGRAM, RIGHT URETEROSCOPY  BIOPSY AND STENT: BLADDER BIOPSY;  Surgeon: Garnett Farm, MD;  Location: [facility] [place];  Service: Urology;  Laterality: Right;   VERTEBROPLASTY  2001   Family History  Problem Relation Age of Onset   CAD Neg Hx    Diabetes Neg Hx    Colon cancer Neg Hx    Prostate cancer Neg Hx    Allergic rhinitis Neg Hx    Angioedema Neg Hx    Asthma Neg Hx    Atopy Neg Hx    Eczema Neg Hx    Immunodeficiency  Neg Hx    Urticaria Neg Hx    Esophageal cancer Neg Hx    Social History   Socioeconomic History   Marital status: Married    Spouse name: Not on file   Number of children: 2   Years of education: Not on file   Highest education level: Not on file  Occupational History   Occupation: Education officer, environmental, semi retired     Associate Professor: RETIRED  Tobacco Use   Smoking status: Never   Smokeless tobacco: Never  Vaping Use   Vaping Use: Never used  Substance and Sexual Activity   Alcohol use: No   Drug use: No   Sexual activity: Not on file  Other Topics Concern   Not on file  Social History Narrative   Retired, patient was a Optician, dispensing still preachs, Musician   Married > 50 years, lost wife aprox 5-11.    Remarried         Social Determinants of Health   Financial Resource Strain: Low Risk  (12/16/2022)   Overall Financial Resource Strain (CARDIA)    Difficulty of Paying Living Expenses: Not hard at all  Food Insecurity: No Food Insecurity (12/16/2022)   Hunger Vital Sign    Worried About Running Out of Food in the Last Year: Never true    Ran Out of Food in the Last Year: Never true  Transportation Needs: No Transportation Needs (12/16/2022)   PRAPARE - Administrator, Civil Service (Medical): No    Lack of Transportation (Non-Medical): No  Physical Activity: Inactive (12/16/2022)   Exercise Vital Sign    Days of Exercise per Week: 0 days    Minutes of Exercise per Session: 0 min  Stress: No Stress Concern Present (12/16/2022)   [employer] of Occupational Health - Occupational Stress Questionnaire    Feeling of Stress : Not at all  Social Connections: Socially Integrated (12/08/2021)   Social Connection and Isolation Panel [NHANES]    Frequency of Communication with Friends and Family: More than three times a week    Frequency of Social Gatherings with Friends and Family: More than three times a week    Attends Religious Services: More than 4 times per year    Active Member  of [employer] or Organizations: Yes    Attends Engineer, structural: More than 4 times per year    Marital Status: Married    Tobacco Counseling Counseling given: Not Answered   Clinical Intake:  Pre-visit preparation completed: Yes  Pain : No/denies pain     BMI - recorded: 25.31  Nutritional Status: BMI 25 -29 Overweight Nutritional Risks: None Diabetes: No  How often do you need to have someone help you when you read instructions, pamphlets, or other written materials from your doctor or pharmacy?: 1 - Never  Interpreter Needed?: No  Information entered by :: Donne Anon, CMA   Activities of Daily Living    12/16/2022   10:49 AM 02/19/2022   10:00 PM  In your present state of health, do you have any difficulty performing the following activities:  Hearing? 0 0  Vision? 0 0  Difficulty concentrating or making decisions? 0 0  Walking or climbing stairs? 0 0  Dressing or bathing? 0 0  Doing errands, shopping? 0 0  Preparing Food and eating ? N   Using the Toilet? N   In the past six months, have you accidently leaked urine? N   Do you have problems with loss of bowel control? N   Managing your Medications? N   Managing your Finances? N   Housekeeping or managing your Housekeeping? N     Patient Care Team: Wanda Plump, MD as PCP - General Jodelle Red, MD as PCP - Cardiology (Cardiology) Ihor Gully, MD (Inactive) as Consulting Physician (Urology) Wendall Stade, MD as Consulting Physician (Cardiology) Kennon Rounds as Physician Assistant (Cardiology)  Indicate any recent Medical Services you may have received from other than [facility] providers in the past year (date may be approximate).     Assessment:   This is a routine wellness examination for Dollie.  Hearing/Vision screen No results found.  Dietary issues and exercise activities discussed:     Goals Addressed   None    Depression Screen    12/16/2022   10:56 AM 09/18/2022     1:15 PM 06/01/2022    1:35 PM 03/03/2022    1:43 PM 12/08/2021    2:39 PM 05/19/2021    1:52 PM 03/18/2021    3:21 PM  PHQ 2/9 Scores  PHQ - 2 Score 0 0 0 0 0 0 0    Fall Risk    12/16/2022   10:56 AM 09/18/2022    1:15 PM 06/01/2022    1:35 PM 03/03/2022    1:43 PM 12/08/2021    2:39 PM  Fall Risk   Falls in the past year? 0 0 0 0 0  Number falls in past yr: 0 0 0 0 0  Injury with Fall? 0 0 0 0 0  Risk for fall due to : No Fall Risks    No Fall Risks  Follow up Falls evaluation completed Falls evaluation completed Falls evaluation completed Falls evaluation completed Falls evaluation completed    MEDICARE RISK AT HOME:  Medicare Risk at Home - 12/16/22 1051     Any stairs in or around the home? Yes    If so, are there any without handrails? No    Home free of loose throw rugs in walkways, pet beds, electrical cords, etc? Yes    Adequate lighting in your home to reduce risk of falls? Yes    Life alert? No    Use of a cane, walker or w/c? No    Grab bars in the bathroom? Yes    Shower chair or bench in shower? No    Elevated toilet seat or a handicapped toilet? No             TIMED UP AND GO:  Was the test performed?  Yes  Length of  time to ambulate 10 feet: 10 sec Gait slow and steady without use of assistive device    Cognitive Function:    06/25/2017   10:19 AM 06/02/2016    8:26 AM  MMSE - Mini Mental State Exam  Orientation to time 5 5  Orientation to Place 5 5  Registration 3 3  Attention/ Calculation 5 5  Recall 3 3  Language- name 2 objects 2 2  Language- repeat 1 1  Language- follow 3 step command 3 3  Language- read & follow direction 1 1  Write a sentence 1 1  Copy design 1 1  Total score 30 30        12/16/2022   10:57 AM 12/08/2021    2:45 PM 01/03/2020   10:30 AM  6CIT Screen  What Year? 0 points 0 points 0 points  What month? 0 points 0 points 0 points  What time? 0 points 0 points 0 points  Count back from 20 0 points 4 points 0 points   Months in reverse 0 points 0 points 0 points  Repeat phrase 0 points 2 points 0 points  Total Score 0 points 6 points 0 points    Immunizations Immunization History  Administered Date(s) Administered   Fluad Quad(high Dose 65+) 02/22/2019   Influenza Split 03/18/2011, 03/17/2012   Influenza Whole 05/24/2007, 05/02/2008, 03/15/2009, 03/14/2010   Influenza, High Dose Seasonal PF 03/20/2013, 03/07/2018, 03/25/2021, 03/31/2022   Influenza,inj,Quad PF,6+ Mos 03/09/2014   Influenza-Unspecified 03/11/2015, 03/15/2017, 03/15/2020   PFIZER(Purple Top)SARS-COV-2 Vaccination 07/07/2019, 07/28/2019, 03/15/2020   Pneumococcal Conjugate-13 08/07/2014   Pneumococcal Polysaccharide-23 06/15/1996, 05/20/2009, 05/19/2021   Td 09/18/1999   Tdap 09/01/2011   Zoster Recombinant(Shingrix) 10/14/2020, 05/12/2021   Zoster, Live 05/24/2009    TDAP status: Due, Education has been provided regarding the importance of this vaccine. Advised may receive this vaccine at local pharmacy or Health Dept. Aware to provide a copy of the vaccination record if obtained from local pharmacy or Health Dept. Verbalized acceptance and understanding.  Flu Vaccine status: Up to date  Pneumococcal vaccine status: Up to date  Covid-19 vaccine status: Declined, Education has been provided regarding the importance of this vaccine but patient still declined. Advised may receive this vaccine at local pharmacy or Health Dept.or vaccine clinic. Aware to provide a copy of the vaccination record if obtained from local pharmacy or Health Dept. Verbalized acceptance and understanding.  Qualifies for Shingles Vaccine? Yes   Zostavax completed Yes   Shingrix Completed?: Yes  Screening Tests Health Maintenance  Topic Date Due   DTaP/Tdap/Td (3 - Td or Tdap) 08/31/2021   Medicare Annual Wellness (AWV)  12/09/2022   INFLUENZA VACCINE  01/14/2023   Pneumonia Vaccine 74+ Years old  Completed   Zoster Vaccines- Shingrix  Completed   HPV  VACCINES  Aged Out   COVID-19 Vaccine  Discontinued    Health Maintenance  Health Maintenance Due  Topic Date Due   DTaP/Tdap/Td (3 - Td or Tdap) 08/31/2021   Medicare Annual Wellness (AWV)  12/09/2022    Colorectal cancer screening: No longer required.   Lung Cancer Screening: (Low Dose CT Chest recommended if Age 30-80 years, 20 pack-year currently smoking OR have quit w/in 15years.) does not qualify.   Additional Screening:  Hepatitis C Screening: does not qualify  Vision Screening: Recommended annual ophthalmology exams for early detection of glaucoma and other disorders of the eye. Is the patient up to date with their annual eye exam?  Yes  Who is  the provider or what is the name of the office in which the patient attends annual eye exams? Dr. Dione Booze If pt is not established with a provider, would they like to be referred to a provider to establish care? No .   Dental Screening: Recommended annual dental exams for proper oral hygiene  Diabetic Foot Exam: N/a  Community Resource Referral / Chronic Care Management: CRR required this visit?  No   CCM required this visit?  No     Plan:     I have personally reviewed and noted the following in the patient's chart:   Medical and social history Use of alcohol, tobacco or illicit drugs  Current medications and supplements including opioid prescriptions. Patient is not currently taking opioid prescriptions. Functional ability and status Nutritional status Physical activity Advanced directives List of other physicians Hospitalizations, surgeries, and ER visits in previous 12 months Vitals Screenings to include cognitive, depression, and falls Referrals and appointments  In addition, I have reviewed and discussed with patient certain preventive protocols, quality metrics, and best practice recommendations. A written personalized care plan for preventive services as well as general preventive health recommendations were  provided to patient.     Donne Anon, CMA   12/16/2022   After Visit Summary: Sent to mychart  Nurse Notes: None

## 2022-12-21 DIAGNOSIS — H43812 Vitreous degeneration, left eye: Secondary | ICD-10-CM | POA: Diagnosis not present

## 2022-12-21 DIAGNOSIS — H353131 Nonexudative age-related macular degeneration, bilateral, early dry stage: Secondary | ICD-10-CM | POA: Diagnosis not present

## 2022-12-21 DIAGNOSIS — Z961 Presence of intraocular lens: Secondary | ICD-10-CM | POA: Diagnosis not present

## 2022-12-21 DIAGNOSIS — I679 Cerebrovascular disease, unspecified: Secondary | ICD-10-CM | POA: Diagnosis not present

## 2023-01-06 DIAGNOSIS — M25512 Pain in left shoulder: Secondary | ICD-10-CM | POA: Diagnosis not present

## 2023-01-06 DIAGNOSIS — M25511 Pain in right shoulder: Secondary | ICD-10-CM | POA: Diagnosis not present

## 2023-02-03 DIAGNOSIS — M25511 Pain in right shoulder: Secondary | ICD-10-CM | POA: Diagnosis not present

## 2023-02-03 DIAGNOSIS — M25512 Pain in left shoulder: Secondary | ICD-10-CM | POA: Diagnosis not present

## 2023-03-22 ENCOUNTER — Encounter: Payer: Self-pay | Admitting: Internal Medicine

## 2023-03-22 ENCOUNTER — Ambulatory Visit (INDEPENDENT_AMBULATORY_CARE_PROVIDER_SITE_OTHER): Payer: Medicare HMO | Admitting: Internal Medicine

## 2023-03-22 VITALS — BP 126/68 | HR 71 | Temp 98.0°F | Resp 16 | Ht 66.5 in | Wt 159.0 lb

## 2023-03-22 DIAGNOSIS — H9190 Unspecified hearing loss, unspecified ear: Secondary | ICD-10-CM

## 2023-03-22 DIAGNOSIS — E785 Hyperlipidemia, unspecified: Secondary | ICD-10-CM

## 2023-03-22 DIAGNOSIS — R739 Hyperglycemia, unspecified: Secondary | ICD-10-CM | POA: Diagnosis not present

## 2023-03-22 DIAGNOSIS — G4700 Insomnia, unspecified: Secondary | ICD-10-CM

## 2023-03-22 DIAGNOSIS — I10 Essential (primary) hypertension: Secondary | ICD-10-CM

## 2023-03-22 DIAGNOSIS — R5383 Other fatigue: Secondary | ICD-10-CM | POA: Diagnosis not present

## 2023-03-22 NOTE — Progress Notes (Signed)
Subjective:    Patient ID: Cory Zavala, male    DOB: 20-May-1931, 89 y.o.   MRN: [id number]  DOS:  03/22/2023 Type of visit - description: f/u  Here for follow-up, doing about the same. Was unable to tolerate Ambien. Again complaining about fatigue.  See last office visit. Denies chest pain or difficulty breathing.  No edema. 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  Review of Systems See above   Past Medical History:  Diagnosis Date   Back pain, chronic    BPH (benign prostatic hypertrophy)    Diverticulosis of colon    LEFT   ED (erectile dysfunction)    GERD (gastroesophageal reflux disease) 09/15/2012   Gross hematuria    2015   HTN (hypertension)    Nocturia    Normal cardiac stress test    PER CARDIOLOGIST NOTE, DR NISHAN, APPROX.  1994   Osteoporosis    RBBB     Past Surgical History:  Procedure Laterality Date   CATARACT EXTRACTION W/ INTRAOCULAR LENS  IMPLANT, BILATERAL  2010   CYSTOSCOPY WITH RETROGRADE PYELOGRAM, URETEROSCOPY AND STENT PLACEMENT Right 12/25/2013   Procedure: CYSTOSCOPY WITH RIGHT RETROGRADE PYELOGRAM, RIGHT URETEROSCOPY  BIOPSY AND STENT: BLADDER BIOPSY;  Surgeon: Garnett Farm, MD;  Location: [facility];  Service: Urology;  Laterality: Right;   VERTEBROPLASTY  2001    Current Outpatient Medications  Medication Instructions   amLODipine (NORVASC) 5 mg, Oral, Daily   aspirin EC 81 mg, Oral, Daily, Swallow whole.   Cholecalciferol (D3 ADULT PO) 1 tablet, Oral, Daily   EPINEPHrine (EPI-PEN) 0.3 mg, Intramuscular, As needed, INJECT 0.3MLS(CONTENTS OF 1 SYRINGE) INTO THE MUSCLE AS DIRECTED   loratadine (CLARITIN) 10 MG tablet Take 1-2 tablets 1-2 times per day   losartan (COZAAR) 50 mg, Oral, Daily   Multiple Vitamin (MULTIVITAMIN) tablet 1 tablet, Oral, Daily   Multiple Vitamins-Minerals (PRESERVISION AREDS) CAPS 1 capsule, Oral, Daily   Nutritional Supplements (OSTEO ADVANCE PO) 4 tablets,  Oral, Daily, OSTEO GOLD (BRAND)    OMEGA-3 FATTY ACIDS PO 1 tablet, Oral, Daily   pantoprazole (PROTONIX) 40 mg, Oral, 2 times daily before meals   rosuvastatin (CRESTOR) 10 mg, Oral, Daily   vitamin B-12 (CYANOCOBALAMIN) 500 mcg, Oral, Daily       Objective:   Physical Exam BP 126/68   Pulse 71   Temp 98 F (36.7 C) (Oral)   Resp 16   Ht 5' 6.5" (1.689 m)   Wt 159 lb (72.1 kg)   SpO2 97%   BMI 25.28 kg/m  General:   Well developed, NAD, BMI noted. HEENT:  Normocephalic . Face symmetric, atraumatic Lungs:  CTA B Normal respiratory effort, no intercostal retractions, no accessory muscle use. Heart: RRR,  no murmur.  Lower extremities: no pretibial edema bilaterally  Skin: Not pale. Not jaundice Neurologic:  alert & oriented X3.  Speech normal, gait appropriate for age and unassisted.  Had some difficulty transferring. Psych--  Cognition and judgment appear intact.  Cooperative with normal attention span and concentration.  Behavior appropriate. No anxious or depressed appearing.      Assessment    Assessment   Hyperglycemia HTN High cholesterol GERD RBBB MSK: --Chronic back pain -- Vertebroplasty 2001 -- Osteoporosis T score -3.3 (2014), declined all meds 2014 and  2019 GU: ---BPH ---ED ---Gross hematuria 2015  Initial CT show a question of urothelial cancer, subsequently a ureteroscopy was done and reportedly normal, subsequently  a biopsy was negative. Follow-up MRI 01-2014 show improvement. Saw urology ~ 04-2015 Exercise stress test 2014 negative Bee sting allergies: has a Epi pen Stroke 02-2022   PLAN: Hyperglycemia: Check A1c [id number] amlodipine.  Checking labs High cholesterol: On rosuvastatin 10 mg daily.  Checking labs Insomnia: See LOV, tried Ambien briefly, he felt he was too strong and stopped.  Recommend melatonin and observation. Fatigue: Fatigue, chronic issue, ROS benign.  Will do general labs including vitamins. Gait: Gait    appropriate from 89 year old gentleman, transferring somewhat difficult.  Recommend to use a cane to prevent falls but he is quite reluctant. HOH: requests referral, sent  Vaccine advice: Strongly recommend a COVID-vaccine, see AVS. RTC CPX 3 months

## 2023-03-22 NOTE — Patient Instructions (Addendum)
Vaccines I recommend: Tdap (tetanus) RSV vaccine COVID-vaccine  Please consider using a cane consistently to prevent falls   GO TO THE LAB : Get the blood work     Next visit with me in 3 months for a physical exam.    Please schedule it at the front desk

## 2023-03-22 NOTE — Assessment & Plan Note (Addendum)
Hyperglycemia: Check A1c [id number] amlodipine.  Checking labs High cholesterol: On rosuvastatin 10 mg daily.  Checking labs Insomnia: See LOV, tried Ambien briefly, he felt he was too strong and stopped.  Recommend melatonin and observation. Fatigue: Fatigue, chronic issue, ROS benign.  Will do general labs including vitamins. Gait: Gait   appropriate from 89 year old gentleman, transferring somewhat difficult.  Recommend to use a cane to prevent falls but he is quite reluctant. HOH: requests referral, sent  Vaccine advice: Strongly recommend a COVID-vaccine, see AVS. RTC CPX 3 months

## 2023-03-23 LAB — ALT: ALT: 13 U/L (ref 0–53)

## 2023-03-23 LAB — BASIC METABOLIC PANEL
BUN: 16 mg/dL (ref 6–23)
CO2: 26 meq/L (ref 19–32)
Calcium: 9.5 mg/dL (ref 8.4–10.5)
Chloride: 107 meq/L (ref 96–112)
Creatinine, Ser: 1.03 mg/dL (ref 0.40–1.50)
GFR: 63.84 mL/min (ref >60.00)
Glucose, Bld: 111 mg/dL — ABNORMAL HIGH (ref 70–99)
Potassium: 4 meq/L (ref 3.5–5.1)
Sodium: 143 meq/L (ref 135–145)

## 2023-03-23 LAB — CBC WITH DIFFERENTIAL/PLATELET
Basophils Absolute: 0.1 10*3/uL (ref 0.0–0.1)
Basophils Relative: 1 % (ref 0.0–3.0)
Eosinophils Absolute: 0.1 10*3/uL (ref 0.0–0.7)
Eosinophils Relative: 1.3 % (ref 0.0–5.0)
HCT: 42.5 % (ref 39.0–52.0)
Hemoglobin: 13.9 g/dL (ref 13.0–17.0)
Lymphocytes Relative: 23.4 % (ref 12.0–46.0)
Lymphs Abs: 2 10*3/uL (ref 0.7–4.0)
MCHC: 32.6 g/dL (ref 30.0–36.0)
MCV: 97.5 fL (ref 78.0–100.0)
Monocytes Absolute: 0.7 10*3/uL (ref 0.1–1.0)
Monocytes Relative: 8.1 % (ref 3.0–12.0)
Neutro Abs: 5.6 10*3/uL (ref 1.4–7.7)
Neutrophils Relative %: 66.2 % (ref 43.0–77.0)
Platelets: 201 10*3/uL (ref 150.0–400.0)
RBC: 4.36 Mil/uL (ref 4.22–5.81)
RDW: 13.4 % (ref 11.5–15.5)
WBC: 8.5 10*3/uL (ref 4.0–10.5)

## 2023-03-23 LAB — AST: AST: 15 U/L (ref 0–37)

## 2023-03-23 LAB — VITAMIN D 25 HYDROXY (VIT D DEFICIENCY, FRACTURES): VITD: 61.35 ng/mL (ref 30.00–100.00)

## 2023-03-23 LAB — B12 AND FOLATE PANEL
Folate: >24.2 ng/mL (ref >5.9)
Vitamin B-12: 811 pg/mL (ref 211–911)

## 2023-03-23 LAB — LIPID PANEL
Cholesterol: 151 mg/dL (ref 0–200)
HDL: 54.5 mg/dL (ref >39.00)
LDL Cholesterol: 79 mg/dL (ref 0–99)
NonHDL: 96.67
Total CHOL/HDL Ratio: 3
Triglycerides: 90 mg/dL (ref 0.0–149.0)
VLDL: 18 mg/dL (ref 0.0–40.0)

## 2023-03-23 LAB — HEMOGLOBIN A1C: Hgb A1c MFr Bld: 6 % (ref 4.6–6.5)

## 2023-03-26 ENCOUNTER — Telehealth: Payer: Self-pay | Admitting: Internal Medicine

## 2023-03-26 NOTE — Telephone Encounter (Signed)
Pt called and stated that [facility] is requiring an approval to refill his medication for rosuvastatin (CRESTOR) 10 MG tablet. Please call and advise.

## 2023-03-28 ENCOUNTER — Other Ambulatory Visit: Payer: Self-pay | Admitting: Internal Medicine

## 2023-03-29 MED ORDER — ROSUVASTATIN CALCIUM 10 MG PO TABS: 10.0000 mg | ORAL_TABLET | Freq: Every day | ORAL | 1 refills | Status: DC

## 2023-03-29 NOTE — Addendum Note (Signed)
Addended byConrad Boyle D on: 03/29/2023 07:43 AM   Modules accepted: Orders

## 2023-03-29 NOTE — Telephone Encounter (Signed)
Rx sent 

## 2023-04-21 ENCOUNTER — Telehealth: Payer: Self-pay | Admitting: Internal Medicine

## 2023-04-21 NOTE — Telephone Encounter (Signed)
Pt dropped off copy of Power of Attorney for provider to see and put on his chart. Document put at front office tray under providers name.

## 2023-04-21 NOTE — Telephone Encounter (Signed)
Placed in PCP red folder to be reviewed

## 2023-04-22 DIAGNOSIS — M25512 Pain in left shoulder: Secondary | ICD-10-CM | POA: Diagnosis not present

## 2023-04-22 DIAGNOSIS — M25511 Pain in right shoulder: Secondary | ICD-10-CM | POA: Diagnosis not present

## 2023-06-12 ENCOUNTER — Other Ambulatory Visit: Payer: Self-pay | Admitting: Internal Medicine

## 2023-06-23 ENCOUNTER — Encounter: Payer: Self-pay | Admitting: Internal Medicine

## 2023-06-23 ENCOUNTER — Ambulatory Visit: Payer: Medicare HMO | Admitting: Internal Medicine

## 2023-06-23 VITALS — BP 116/78 | HR 70 | Temp 97.9°F | Resp 16 | Ht 66.5 in | Wt 162.0 lb

## 2023-06-23 DIAGNOSIS — Z0000 Encounter for general adult medical examination without abnormal findings: Secondary | ICD-10-CM

## 2023-06-23 DIAGNOSIS — E785 Hyperlipidemia, unspecified: Secondary | ICD-10-CM | POA: Diagnosis not present

## 2023-06-23 LAB — LIPID PANEL
Cholesterol: 116 mg/dL (ref 0–200)
HDL: 54.5 mg/dL (ref >39.00)
LDL Cholesterol: 50 mg/dL (ref 0–99)
NonHDL: 61.23
Total CHOL/HDL Ratio: 2
Triglycerides: 54 mg/dL (ref 0.0–149.0)
VLDL: 10.8 mg/dL (ref 0.0–40.0)

## 2023-06-23 NOTE — Progress Notes (Signed)
 Subjective:    Patient ID: Cory Zavala, male    DOB: Apr 13, 1931, 89 y.o.   MRN: [id number]  DOS:  06/23/2023 Type of visit - description: CPX  Doing well, no major concerns, started to use a cane more consistently. BP was low x 1, no symptoms. Has nocturia, at baseline, no dysuria or gross hematuria.  Review of Systems  Other than above, a 14 point review of systems is negative     Past Medical History:  Diagnosis Date   Back pain, chronic    BPH (benign prostatic hypertrophy)    Diverticulosis of colon    LEFT   ED (erectile dysfunction)    GERD (gastroesophageal reflux disease) 09/15/2012   Gross hematuria    2015   HTN (hypertension)    Nocturia    Normal cardiac stress test    PER CARDIOLOGIST NOTE, DR NISHAN, APPROX.  1994   Osteoporosis    RBBB     Past Surgical History:  Procedure Laterality Date   CATARACT EXTRACTION W/ INTRAOCULAR LENS  IMPLANT, BILATERAL  2010   CYSTOSCOPY WITH RETROGRADE PYELOGRAM, URETEROSCOPY AND STENT PLACEMENT Right 12/25/2013   Procedure: CYSTOSCOPY WITH RIGHT RETROGRADE PYELOGRAM, RIGHT URETEROSCOPY  BIOPSY AND STENT: BLADDER BIOPSY;  Surgeon: Mark C Ottelin, MD;  Location: [facility];  Service: Urology;  Laterality: Right;   VERTEBROPLASTY  2001   Social History   Socioeconomic History   Marital status: Married    Spouse name: Not on file   Number of children: 2   Years of education: Not on file   Highest education level: Not on file  Occupational History   Occupation: education officer, environmental, semi retired     Associate Professor: RETIRED  Tobacco Use   Smoking status: Never   Smokeless tobacco: Never  Vaping Use   Vaping status: Never Used  Substance and Sexual Activity   Alcohol use: No   Drug use: No   Sexual activity: Not on file  Other Topics Concern   Not on file  Social History Narrative   Retired, patient was a optician, dispensing still preachs, musician   Married > 50 years, lost wife aprox 5-11.    Remarried         Social  Drivers of Health   Financial Resource Strain: Low Risk  (12/16/2022)   Overall Financial Resource Strain (CARDIA)    Difficulty of Paying Living Expenses: Not hard at all  Food Insecurity: No Food Insecurity (12/16/2022)   Hunger Vital Sign    Worried About Running Out of Food in the Last Year: Never true    Ran Out of Food in the Last Year: Never true  Transportation Needs: No Transportation Needs (12/16/2022)   PRAPARE - Administrator, Civil Service (Medical): No    Lack of Transportation (Non-Medical): No  Physical Activity: Inactive (12/16/2022)   Exercise Vital Sign    Days of Exercise per Week: 0 days    Minutes of Exercise per Session: 0 min  Stress: No Stress Concern Present (12/16/2022)   [employer] of Occupational Health - Occupational Stress Questionnaire    Feeling of Stress : Not at all  Social Connections: Socially Integrated (12/08/2021)   Social Connection and Isolation Panel [NHANES]    Frequency of Communication with Friends and Family: More than three times a week    Frequency of Social Gatherings with Friends and Family: More than three times a week    Attends Religious Services: More than 4 times  per year    Active Member of Clubs or Organizations: Yes    Attends Banker Meetings: More than 4 times per year    Marital Status: Married  Catering Manager Violence: Not At Risk (12/16/2022)   Humiliation, Afraid, Rape, and Kick questionnaire    Fear of Current or Ex-Partner: No    Emotionally Abused: No    Physically Abused: No    Sexually Abused: No    Current Outpatient Medications  Medication Instructions   amLODipine  (NORVASC ) 5 mg, Oral, Daily   aspirin  EC 81 mg, Oral, Daily, Swallow whole.   Cholecalciferol (D3 ADULT PO) 1 tablet, Daily   EPINEPHrine  (EPI-PEN) 0.3 mg, Intramuscular, As needed, INJECT 0.3MLS(CONTENTS OF 1 SYRINGE) INTO THE MUSCLE AS DIRECTED   loratadine  (CLARITIN ) 10 MG tablet Take 1-2 tablets 1-2 times per day    losartan  (COZAAR ) 50 mg, Oral, Daily   Multiple Vitamin (MULTIVITAMIN) tablet 1 tablet, Daily   Multiple Vitamins-Minerals (PRESERVISION AREDS) CAPS 1 capsule, Daily   Nutritional Supplements (OSTEO ADVANCE PO) 4 tablets, Daily   OMEGA-3 FATTY ACIDS PO 1 tablet, Daily   pantoprazole  (PROTONIX ) 40 mg, Oral, 2 times daily before meals   rosuvastatin  (CRESTOR ) 10 mg, Oral, Daily   vitamin B-12 (CYANOCOBALAMIN ) 500 mcg, Daily       Objective:   Physical Exam BP 116/78   Pulse 70   Temp 97.9 F (36.6 C) (Oral)   Resp 16   Ht 5' 6.5 (1.689 m)   Wt 162 lb (73.5 kg)   SpO2 97%   BMI 25.76 kg/m  General: Well developed, NAD, BMI noted Neck: No  thyromegaly  HEENT:  Normocephalic . Face symmetric, atraumatic Lungs:  CTA B Normal respiratory effort, no intercostal retractions, no accessory muscle use. Heart: RRR,  no murmur.  Lower extremities: no pretibial edema bilaterally  Skin: Exposed areas without rash. Not pale. Not jaundice Neurologic:  alert & oriented X3.  Speech normal, gait assisted by a cane Strength symmetric and appropriate for age.  Psych: Cognition and judgment appear intact.  Cooperative with normal attention span and concentration.  Behavior appropriate. No anxious or depressed appearing.     Assessment    Problem list: Hyperglycemia HTN High cholesterol GERD RBBB MSK: --Chronic back pain -- Vertebroplasty 2001 -- Osteoporosis T score -3.3 (2014), declined all meds 2014 and  2019 GU: ---BPH ---ED ---Gross hematuria 2015  Initial CT show a question of urothelial cancer, subsequently a ureteroscopy was done and reportedly normal, subsequently a biopsy was negative. Exercise stress test 2014 negative Bee sting allergies: has a Epi pen Stroke 02-2022   PLAN: Here for CPX - Td 2013 - PNM 23:1998, 05-2009 and 2022; prevnar 2016 - s/p zostavax & Shingrix  -- had a flu shot -Recommend: Tdap, COVID booster if not done recently, RSV. -CCS:  Not  indicated -prostate ca screening: Not indicated, has history of BPH. -Healthcare POA : on file  -Labs reviewed, check FLP Hyperglycemia: Last A1c is stable. HTN: BP was 104 /70 one time. no symptoms at the time, recommend to continue amlodipine , losartan , if BP is consistently low let me know High cholesterol: On Crestor  10 mg, last LDL above goal, no changes made.  Recheck today. Gait: Using cane. Praised RTC 6 months.

## 2023-06-23 NOTE — Assessment & Plan Note (Signed)
 Here for CPX - Td 2013 - PNM 23:1998, 05-2009 and 2022; prevnar 2016 - s/p zostavax & Shingrix  -- had a flu shot -Recommend: Tdap, COVID booster if not done recently, RSV. -CCS:  Not indicated -prostate ca screening: Not indicated, has history of BPH. -Healthcare POA : on file  -Labs reviewed, check FLP

## 2023-06-23 NOTE — Patient Instructions (Addendum)
 Vaccines I recommend: Tdap (tetanus) RSV Covid booster    Check the  blood pressure regularly Blood pressure goal:  between 110/65 and  135/85. If it is consistently higher or lower, let me know     GO TO THE LAB : Get the blood work     Next visit with me  in 6 months   Please schedule it at the front desk

## 2023-06-23 NOTE — Assessment & Plan Note (Signed)
 Here for CPX Hyperglycemia: Last A1c is stable. HTN: BP was 104 /70 one time. no symptoms at the time, recommend to continue amlodipine , losartan , if BP is consistently low let me know High cholesterol: On Crestor  10 mg, last LDL above goal, no changes made.  Recheck today. Gait: Using cane. Praised RTC 6 months.

## 2023-07-06 ENCOUNTER — Other Ambulatory Visit: Payer: Self-pay | Admitting: Internal Medicine

## 2023-07-13 DIAGNOSIS — Z008 Encounter for other general examination: Secondary | ICD-10-CM | POA: Diagnosis not present

## 2023-10-27 ENCOUNTER — Telehealth: Payer: Self-pay

## 2023-10-27 NOTE — Telephone Encounter (Signed)
 Copied from CRM [id number]. Topic: General - Other >> Oct 27, 2023 10:08 AM Luane Rumps D wrote: Reason for CRM: Patient called because he said he received a MyChart message but isn't able to access it, he is wanting a call back if there is anything important he missed.

## 2023-10-27 NOTE — Telephone Encounter (Signed)
 Spoke w/ Pt- informed I reviewed chart- I don't see where a message has been sent, informed it could have been an automated message from Mychart regarding upcoming appts, Pt verbalized understanding.

## 2023-11-27 ENCOUNTER — Other Ambulatory Visit: Payer: Self-pay | Admitting: Internal Medicine

## 2023-12-15 ENCOUNTER — Ambulatory Visit

## 2023-12-15 VITALS — BP 116/78 | Ht 66.5 in | Wt 162.0 lb

## 2023-12-15 DIAGNOSIS — Z2821 Immunization not carried out because of patient refusal: Secondary | ICD-10-CM | POA: Diagnosis not present

## 2023-12-15 DIAGNOSIS — Z0000 Encounter for general adult medical examination without abnormal findings: Secondary | ICD-10-CM

## 2023-12-15 NOTE — Progress Notes (Signed)
 Because this visit was a virtual/telehealth visit,  certain criteria was not obtained, such a blood pressure, CBG if applicable, and timed get up and go. Any medications not marked as taking were not mentioned during the medication reconciliation part of the visit. Any vitals not documented were not able to be obtained due to this being a telehealth visit or patient was unable to self-report a recent blood pressure reading due to a lack of equipment at home via telehealth. Vitals that have been documented are verbally provided by the patient.  This visit was performed by a medical professional under my direct supervision. I was immediately available for consultation/collaboration. I have reviewed and agree with the Annual Wellness Visit documentation.  Subjective:   Cory Zavala is a 89 y.o. who presents for a Medicare Wellness preventive visit.  As a reminder, Annual Wellness Visits don't include a physical exam, and some assessments may be limited, especially if this visit is performed virtually. We may recommend an in-person follow-up visit with your provider if needed.  Visit Complete: Virtual I connected with  Cory Zavala on 12/15/23 by a audio enabled telemedicine application and verified that I am speaking with the correct person using two identifiers.  Patient Location: Home  Provider Location: Home Office  I discussed the limitations of evaluation and management by telemedicine. The patient expressed understanding and agreed to proceed.  Vital Signs: Because this visit was a virtual/telehealth visit, some criteria may be missing or patient reported. Any vitals not documented were not able to be obtained and vitals that have been documented are patient reported.  VideoDeclined- This patient declined Librarian, academic. Therefore the visit was completed with audio only.  Persons Participating in Visit: Patient.  AWV Questionnaire: No: Patient Medicare  AWV questionnaire was not completed prior to this visit.  Cardiac Risk Factors include: advanced age (>50men, >77 women);male gender;hypertension     Objective:    Today's Vitals   12/15/23 1051  BP: 116/78  Weight: 162 lb (73.5 kg)  Height: 5' 6.5 (1.689 m)   Body mass index is 25.76 kg/m.     12/15/2023   10:55 AM 12/16/2022   10:52 AM 03/25/2022   11:54 AM 02/24/2022   10:32 AM 02/19/2022    2:48 PM 12/08/2021    2:35 PM 01/03/2020   10:22 AM  Advanced Directives  Does Patient Have a Medical Advance Directive? Yes No Yes No No Yes Yes  Type of Estate agent of [place];Living will     Healthcare Power of [place];Out of facility DNR (pink MOST or yellow form);Living will Healthcare Power of [place];Living will  Does patient want to make changes to medical advance directive? No - Patient declined     No - Patient declined No - Patient declined  Copy of Healthcare Power of Attorney in Chart? Yes - validated most recent copy scanned in chart (See row information)     No - copy requested No - copy requested  Would patient like information on creating a medical advance directive?  No - Patient declined -- --       Current Medications (verified) Outpatient Encounter Medications as of 12/15/2023  Medication Sig   amLODipine  (NORVASC ) 5 MG tablet Take 1 tablet (5 mg total) by mouth daily.   aspirin  EC 81 MG tablet Take 1 tablet (81 mg total) by mouth daily. Swallow whole.   Cholecalciferol (D3 ADULT PO) Take 1 tablet by mouth daily at 6 (six) AM.  EPINEPHrine  0.3 mg/0.3 mL IJ SOAJ injection Inject 0.3 mg into the muscle as needed for anaphylaxis. INJECT 0.3MLS(CONTENTS OF 1 SYRINGE) INTO THE MUSCLE AS DIRECTED   loratadine  (CLARITIN ) 10 MG tablet Take 1-2 tablets 1-2 times per day (Patient taking differently: Take 10 mg by mouth daily as needed for allergies.)   losartan  (COZAAR ) 50 MG tablet Take 1 tablet (50 mg total) by mouth daily.   Multiple Vitamin  (MULTIVITAMIN) tablet Take 1 tablet by mouth daily.   Multiple Vitamins-Minerals (PRESERVISION AREDS) CAPS Take 1 capsule by mouth daily.   Nutritional Supplements (OSTEO ADVANCE PO) Take 4 tablets by mouth daily. OSTEO GOLD (BRAND)   OMEGA-3 FATTY ACIDS PO Take 1 tablet by mouth daily.   pantoprazole  (PROTONIX ) 40 MG tablet Take 1 tablet (40 mg total) by mouth 2 (two) times daily before a meal.   rosuvastatin  (CRESTOR ) 10 MG tablet Take 1 tablet (10 mg total) by mouth daily.   vitamin B-12 (CYANOCOBALAMIN ) 500 MCG tablet Take 500 mcg by mouth daily.   No facility-administered encounter medications on file as of 12/15/2023.    Allergies (verified) Bee venom   History: Past Medical History:  Diagnosis Date   Back pain, chronic    BPH (benign prostatic hypertrophy)    Diverticulosis of colon    LEFT   ED (erectile dysfunction)    GERD (gastroesophageal reflux disease) 09/15/2012   Gross hematuria    2015   HTN (hypertension)    Nocturia    Normal cardiac stress test    PER CARDIOLOGIST NOTE, DR NISHAN, APPROX.  1994   Osteoporosis    RBBB    Past Surgical History:  Procedure Laterality Date   CATARACT EXTRACTION W/ INTRAOCULAR LENS  IMPLANT, BILATERAL  2010   CYSTOSCOPY WITH RETROGRADE PYELOGRAM, URETEROSCOPY AND STENT PLACEMENT Right 12/25/2013   Procedure: CYSTOSCOPY WITH RIGHT RETROGRADE PYELOGRAM, RIGHT URETEROSCOPY  BIOPSY AND STENT: BLADDER BIOPSY;  Surgeon: Mark C Ottelin, MD;  Location: [facility];  Service: Urology;  Laterality: Right;   VERTEBROPLASTY  2001   Family History  Problem Relation Age of Onset   CAD Neg Hx    Diabetes Neg Hx    Colon cancer Neg Hx    Prostate cancer Neg Hx    Allergic rhinitis Neg Hx    Angioedema Neg Hx    Asthma Neg Hx    Atopy Neg Hx    Eczema Neg Hx    Immunodeficiency Neg Hx    Urticaria Neg Hx    Esophageal cancer Neg Hx    Social History   Socioeconomic History   Marital status: Married    Spouse name: Not  on file   Number of children: 2   Years of education: Not on file   Highest education level: Not on file  Occupational History   Occupation: Education officer, environmental, semi retired     Associate Professor: RETIRED  Tobacco Use   Smoking status: Never   Smokeless tobacco: Never  Vaping Use   Vaping status: Never Used  Substance and Sexual Activity   Alcohol use: No   Drug use: No   Sexual activity: Not on file  Other Topics Concern   Not on file  Social History Narrative   Retired, patient was a Optician, dispensing still preachs, Musician   Married > 50 years, lost wife aprox 5-11.    Remarried         Social Drivers of Health   Financial Resource Strain: Low Risk  (12/15/2023)  Overall Financial Resource Strain (CARDIA)    Difficulty of Paying Living Expenses: Not hard at all  Food Insecurity: No Food Insecurity (12/15/2023)   Hunger Vital Sign    Worried About Running Out of Food in the Last Year: Never true    Ran Out of Food in the Last Year: Never true  Transportation Needs: No Transportation Needs (12/15/2023)   PRAPARE - Administrator, Civil Service (Medical): No    Lack of Transportation (Non-Medical): No  Physical Activity: Inactive (12/15/2023)   Exercise Vital Sign    Days of Exercise per Week: 0 days    Minutes of Exercise per Session: 0 min  Stress: No Stress Concern Present (12/15/2023)   [employer] of Occupational Health - Occupational Stress Questionnaire    Feeling of Stress: Not at all  Social Connections: Socially Integrated (12/15/2023)   Social Connection and Isolation Panel    Frequency of Communication with Friends and Family: More than three times a week    Frequency of Social Gatherings with Friends and Family: More than three times a week    Attends Religious Services: More than 4 times per year    Active Member of [employer] or Organizations: Yes    Attends Engineer, structural: More than 4 times per year    Marital Status: Married    Tobacco  Counseling Counseling given: Not Answered    Clinical Intake:  Pre-visit preparation completed: Yes  Pain : No/denies pain     BMI - recorded: 25.76 Nutritional Status: BMI 25 -29 Overweight Nutritional Risks: None Diabetes: No  Lab Results  Component Value Date   HGBA1C 6.0 03/22/2023   HGBA1C 5.9 (H) 02/20/2022   HGBA1C 6.0 05/19/2021     How often do you need to have someone help you when you read instructions, pamphlets, or other written materials from your doctor or pharmacy?: 1 - Never What is the last grade level you completed in school?: Some college  Interpreter Needed?: No  Information entered by :: Clent Damore,cma   Activities of Daily Living     12/15/2023   10:54 AM 12/16/2022   10:49 AM  In your present state of health, do you have any difficulty performing the following activities:  Hearing? 0 0  Vision? 0 0  Difficulty concentrating or making decisions? 0 0  Walking or climbing stairs? 0 0  Dressing or bathing? 0 0  Doing errands, shopping? 0 0  Preparing Food and eating ? N N  Using the Toilet? N N  In the past six months, have you accidently leaked urine? Y N  Do you have problems with loss of bowel control? N N  Managing your Medications? N N  Managing your Finances? N N  Housekeeping or managing your Housekeeping? N N    Patient Care Team: Amon Aloysius BRAVO, MD as PCP - General Lonni Slain, MD as PCP - Cardiology (Cardiology) Ottelin, Mark, MD (Inactive) as Consulting Physician (Urology) Delford Maude BROCKS, MD as Consulting Physician (Cardiology) Lelon Glendia ONEIDA DEVONNA as Physician Assistant (Cardiology)  I have updated your Care Teams any recent Medical Services you may have received from other providers in the past year.     Assessment:   This is a routine wellness examination for [place].  Hearing/Vision screen Hearing Screening - Comments:: Patient is going to see about getting hearing aids Vision Screening - Comments::  Patient wears glasses   Goals Addressed  This Visit's Progress    Maintain healthy lifestyle   On track      Depression Screen     12/15/2023   10:56 AM 06/23/2023    1:07 PM 03/22/2023   12:58 PM 12/16/2022   10:56 AM 09/18/2022    1:15 PM 06/01/2022    1:35 PM 03/03/2022    1:43 PM  PHQ 2/9 Scores  PHQ - 2 Score 2 0 0 0 0 0 0  PHQ- 9 Score 3          Fall Risk     12/15/2023   10:55 AM 06/23/2023    1:07 PM 03/22/2023   12:58 PM 12/16/2022   10:56 AM 09/18/2022    1:15 PM  Fall Risk   Falls in the past year? 0 0 0 0 0  Number falls in past yr: 0 0 0 0 0  Injury with Fall? 0 0 0 0 0  Risk for fall due to : No Fall Risks   No Fall Risks   Follow up Falls evaluation completed Falls evaluation completed;Education provided Falls evaluation completed Falls evaluation completed Falls evaluation completed    MEDICARE RISK AT HOME:  Medicare Risk at Home Any stairs in or around the home?: Yes If so, are there any without handrails?: No Home free of loose throw rugs in walkways, pet beds, electrical cords, etc?: Yes Adequate lighting in your home to reduce risk of falls?: Yes Life alert?: No Use of a cane, walker or w/c?: No Grab bars in the bathroom?: Yes Shower chair or bench in shower?: No Elevated toilet seat or a handicapped toilet?: No  TIMED UP AND GO:  Was the test performed?  No  Cognitive Function: 6CIT completed    06/25/2017   10:19 AM 06/02/2016    8:26 AM  MMSE - Mini Mental State Exam  Orientation to time 5  5   Orientation to Place 5  5   Registration 3  3   Attention/ Calculation 5  5   Recall 3  3   Language- name 2 objects 2  2   Language- repeat 1 1  Language- follow 3 step command 3  3   Language- read & follow direction 1  1   Write a sentence 1  1   Copy design 1  1   Total score 30  30      Data saved with a previous flowsheet row definition        12/15/2023   10:52 AM 12/16/2022   10:57 AM 12/08/2021    2:45 PM 01/03/2020    10:30 AM  6CIT Screen  What Year? 0 points 0 points 0 points 0 points  What month? 0 points 0 points 0 points 0 points  What time? 0 points 0 points 0 points 0 points  Count back from 20 0 points 0 points 4 points 0 points  Months in reverse 0 points 0 points 0 points 0 points  Repeat phrase 0 points 0 points 2 points 0 points  Total Score 0 points 0 points 6 points 0 points    Immunizations Immunization History  Administered Date(s) Administered   Fluad Quad(high Dose 65+) 02/22/2019   Influenza Split 03/18/2011, 03/17/2012   Influenza Whole 05/24/2007, 05/02/2008, 03/15/2009, 03/14/2010   Influenza, High Dose Seasonal PF 03/20/2013, 03/07/2018, 03/25/2021, 03/31/2022, 02/05/2023   Influenza,inj,Quad PF,6+ Mos 03/09/2014   Influenza-Unspecified 03/11/2015, 03/15/2017, 03/15/2020   Moderna Covid-19 Fall Seasonal Vaccine 62yrs & older 03/30/2023  PFIZER(Purple Top)SARS-COV-2 Vaccination 07/07/2019, 07/28/2019, 03/15/2020   Pneumococcal Conjugate-13 08/07/2014   Pneumococcal Polysaccharide-23 06/15/1996, 05/20/2009, 05/19/2021   Td 09/18/1999   Tdap 09/01/2011   Zoster Recombinant(Shingrix) 10/14/2020, 05/12/2021   Zoster, Live 05/24/2009    Screening Tests Health Maintenance  Topic Date Due   DTaP/Tdap/Td (3 - Td or Tdap) 08/31/2021   INFLUENZA VACCINE  01/14/2024   Medicare Annual Wellness (AWV)  12/14/2024   Pneumococcal Vaccine: 50+ Years  Completed   Zoster Vaccines- Shingrix  Completed   Hepatitis B Vaccines  Aged Out   HPV VACCINES  Aged Out   Meningococcal B Vaccine  Aged Out   COVID-19 Vaccine  Discontinued    Health Maintenance  Health Maintenance Due  Topic Date Due   DTaP/Tdap/Td (3 - Td or Tdap) 08/31/2021   Health Maintenance Items Addressed:   Additional Screening:  Vision Screening: Recommended annual ophthalmology exams for early detection of glaucoma and other disorders of the eye. Would you like a referral to an eye doctor? No    Dental  Screening: Recommended annual dental exams for proper oral hygiene  Community Resource Referral / Chronic Care Management: CRR required this visit?  No   CCM required this visit?  No   Plan:    I have personally reviewed and noted the following in the patient's chart:   Medical and social history Use of alcohol, tobacco or illicit drugs  Current medications and supplements including opioid prescriptions. Patient is not currently taking opioid prescriptions. Functional ability and status Nutritional status Physical activity Advanced directives List of other physicians Hospitalizations, surgeries, and ER visits in previous 12 months Vitals Screenings to include cognitive, depression, and falls Referrals and appointments  In addition, I have reviewed and discussed with patient certain preventive protocols, quality metrics, and best practice recommendations. A written personalized care plan for preventive services as well as general preventive health recommendations were provided to patient.   Lyle MARLA Right, [place]   12/15/2023   After Visit Summary: (MyChart) Due to this being a telephonic visit, the after visit summary with patients personalized plan was offered to patient via MyChart   Notes: Nothing significant to report at this time.

## 2023-12-15 NOTE — Patient Instructions (Signed)
 Mr. Gaspard , Thank you for taking time out of your busy schedule to complete your Annual Wellness Visit with me. I enjoyed our conversation and look forward to speaking with you again next year. I, as well as your care team,  appreciate your ongoing commitment to your health goals. Please review the following plan we discussed and let me know if I can assist you in the future. Your Game plan/ To Do List    Referrals: If you haven't heard from the office you've been referred to, please reach out to them at the phone provided.  none Follow up Visits: Next Medicare AWV with our clinical staff: 12/20/2024   Have you seen your provider in the last 6 months (3 months if uncontrolled diabetes)? No Next Office Visit with your provider: 12/21/2023  Clinician Recommendations:  Aim for 30 minutes of exercise or brisk walking, 6-8 glasses of water, and 5 servings of fruits and vegetables each day.       This is a list of the screening recommended for you and due dates:  Health Maintenance  Topic Date Due   DTaP/Tdap/Td vaccine (3 - Td or Tdap) 08/31/2021   Flu Shot  01/14/2024   Medicare Annual Wellness Visit  12/14/2024   Pneumococcal Vaccine for age over 89  Completed   Zoster (Shingles) Vaccine  Completed   Hepatitis B Vaccine  Aged Out   HPV Vaccine  Aged Out   Meningitis B Vaccine  Aged Out   COVID-19 Vaccine  Discontinued    Advanced directives: (In Chart) A copy of your advanced directives are scanned into your chart should your provider ever need it. Advance Care Planning is important because it:  [x]  Makes sure you receive the medical care that is consistent with your values, goals, and preferences  [x]  It provides guidance to your family and loved ones and reduces their decisional burden about whether or not they are making the right decisions based on your wishes.  Follow the link provided in your after visit summary or read over the paperwork we have mailed to you to help you  started getting your Advance Directives in place. If you need assistance in completing these, please reach out to us  so that we can help you!  See attachments for Preventive Care and Fall Prevention Tips.

## 2023-12-21 ENCOUNTER — Encounter: Payer: Self-pay | Admitting: Internal Medicine

## 2023-12-21 ENCOUNTER — Ambulatory Visit (INDEPENDENT_AMBULATORY_CARE_PROVIDER_SITE_OTHER): Payer: Medicare HMO | Admitting: Internal Medicine

## 2023-12-21 VITALS — BP 132/84 | HR 77 | Resp 16 | Ht 66.5 in | Wt 159.0 lb

## 2023-12-21 DIAGNOSIS — R350 Frequency of micturition: Secondary | ICD-10-CM | POA: Diagnosis not present

## 2023-12-21 DIAGNOSIS — N401 Enlarged prostate with lower urinary tract symptoms: Secondary | ICD-10-CM | POA: Diagnosis not present

## 2023-12-21 DIAGNOSIS — I10 Essential (primary) hypertension: Secondary | ICD-10-CM

## 2023-12-21 DIAGNOSIS — E785 Hyperlipidemia, unspecified: Secondary | ICD-10-CM

## 2023-12-21 DIAGNOSIS — R739 Hyperglycemia, unspecified: Secondary | ICD-10-CM

## 2023-12-21 DIAGNOSIS — H6123 Impacted cerumen, bilateral: Secondary | ICD-10-CM

## 2023-12-21 LAB — URINALYSIS, ROUTINE W REFLEX MICROSCOPIC
Bilirubin Urine: NEGATIVE
Ketones, ur: NEGATIVE
Leukocytes,Ua: NEGATIVE
Nitrite: NEGATIVE
Specific Gravity, Urine: 1.025 (ref 1.000–1.030)
Total Protein, Urine: NEGATIVE
Urine Glucose: NEGATIVE
Urobilinogen, UA: 0.2 (ref 0.0–1.0)
pH: 6 (ref 5.0–8.0)

## 2023-12-21 MED ORDER — TAMSULOSIN HCL 0.4 MG PO CAPS
0.4000 mg | ORAL_CAPSULE | Freq: Every day | ORAL | 3 refills | Status: DC
Start: 2023-12-21 — End: 2024-03-20

## 2023-12-21 NOTE — Patient Instructions (Addendum)
 Get Debrox over-the-counter, 5 drops on the right ear twice a day for 4 days.   Start Flomax  0.4 mg 1 tablet at bedtime.  GO TO THE LAB :  Get the blood work, provide a urine sample Your results will be posted on MyChart with my comments  Next office visit for a checkup in 4 months Please make an appointment before you leave today

## 2023-12-21 NOTE — Progress Notes (Unsigned)
 Subjective:    Patient ID: Cory Zavala, male    DOB: June 17, 1930, 89 y.o.   MRN: [id number]  DOS:  12/21/2023 Type of visit - description: Routine follow-up  Chronic medical problems addressed.  His main concern is bilateral shoulder pain, this is going on for a while. Denies fever or chills.  No headache.  No myalgias.  No pain at the hip girdle. Sees orthopedics.  History of overactive bladder, symptoms are bothering him more lately, complaining of nocturia 3-4 times.  Denies gross hematuria, dysuria. Denies except for for snoring.   Wt Readings from Last 3 Encounters:  12/21/23 159 lb (72.1 kg)  12/15/23 162 lb (73.5 kg)  06/23/23 162 lb (73.5 kg)      Review of Systems See above   Past Medical History:  Diagnosis Date   Back pain, chronic    BPH (benign prostatic hypertrophy)    Diverticulosis of colon    LEFT   ED (erectile dysfunction)    GERD (gastroesophageal reflux disease) 09/15/2012   Gross hematuria    2015   HTN (hypertension)    Nocturia    Normal cardiac stress test    PER CARDIOLOGIST NOTE, DR NISHAN, APPROX.  1994   Osteoporosis    RBBB     Past Surgical History:  Procedure Laterality Date   CATARACT EXTRACTION W/ INTRAOCULAR LENS  IMPLANT, BILATERAL  2010   CYSTOSCOPY WITH RETROGRADE PYELOGRAM, URETEROSCOPY AND STENT PLACEMENT Right 12/25/2013   Procedure: CYSTOSCOPY WITH RIGHT RETROGRADE PYELOGRAM, RIGHT URETEROSCOPY  BIOPSY AND STENT: BLADDER BIOPSY;  Surgeon: Mark C Ottelin, MD;  Location: [facility];  Service: Urology;  Laterality: Right;   VERTEBROPLASTY  2001    Current Outpatient Medications  Medication Instructions   amLODipine  (NORVASC ) 5 mg, Oral, Daily   aspirin  EC 81 mg, Oral, Daily, Swallow whole.   Cholecalciferol (D3 ADULT PO) 1 tablet, Daily   EPINEPHrine  (EPI-PEN) 0.3 mg, Intramuscular, As needed, INJECT 0.3MLS(CONTENTS OF 1 SYRINGE) INTO THE MUSCLE AS DIRECTED   loratadine  (CLARITIN ) 10 MG tablet Take 1-2  tablets 1-2 times per day   losartan  (COZAAR ) 50 mg, Oral, Daily   Multiple Vitamin (MULTIVITAMIN) tablet 1 tablet, Daily   Multiple Vitamins-Minerals (PRESERVISION AREDS) CAPS 1 capsule, Daily   Nutritional Supplements (OSTEO ADVANCE PO) 4 tablets, Daily   OMEGA-3 FATTY ACIDS PO 1 tablet, Daily   pantoprazole  (PROTONIX ) 40 mg, Oral, 2 times daily before meals   rosuvastatin  (CRESTOR ) 10 mg, Oral, Daily   vitamin B-12 (CYANOCOBALAMIN ) 500 mcg, Daily       Objective:   Physical Exam BP 132/84   Pulse 77   Resp 16   Ht 5' 6.5 (1.689 m)   Wt 159 lb (72.1 kg)   SpO2 96%   BMI 25.28 kg/m  General:   Well developed, NAD, BMI noted.  HEENT:  Normocephalic . Face symmetric, atraumatic. Bilateral cerumen impaction noted.  Ear canals are very narrow. Lungs:  CTA B Normal respiratory effort, no intercostal retractions, no accessory muscle use. Heart: RRR,  no murmur.  Abdomen:  Not distended, soft, non-tender. No rebound or rigidity.   Skin: Not pale. Not jaundice Lower extremities: no pretibial edema bilaterally  Neurologic:  alert & oriented X3.  Speech normal, gait appropriate for age, uses a cane, needs help transferring to the table. Psych--  Cognition and judgment appear intact.  Cooperative with normal attention span and concentration.  Behavior appropriate. No anxious or depressed appearing.  Assessment   Problem list: Hyperglycemia HTN High cholesterol GERD RBBB MSK: --Chronic back pain -- Vertebroplasty 2001 -- Osteoporosis T score -3.3 (2014), declined all meds 2014 and  2019 GU: ---BPH ---ED ---Gross hematuria 2015  Initial CT show a question of urothelial cancer, subsequently a ureteroscopy was done and reportedly normal, subsequently a biopsy was negative. Exercise stress test 2014 negative Bee sting allergies: has a Epi pen Stroke 02-2022   PLAN: UA urine culture BMP CBC HTN: BP looks very good, continue losartan , amlodipine , checking  labs. High cholesterol: On Crestor , last LDL very good. History of stroke, continue aspirin  BPH.  Reports a history of BPH and OAB, has not seen urology in a while, having nocturia.  Myrbetriq apparently caused urinary retention per last urology note. Plan: UA urine culture, trial with Flomax  Shoulder pain: Per Ortho Cerumen impaction:  With the patient permission I introduced a spoon to the left ear canal, a fair amount of wax was removed.  TM normal. Attempted the same of the right side, canal is very [place], could not canal is very narrow, unable to remove wax, recommend Debrox. RTC 4   months    1/8 Here for CPX - Td 2013 - PNM 23:1998, 05-2009 and 2022; prevnar 2016 - s/p zostavax & Shingrix  -- had a flu shot -Recommend: Tdap, COVID booster if not done recently, RSV. -CCS:  Not indicated -prostate ca screening: Not indicated, has history of BPH. -Healthcare POA : on file  -Labs reviewed, check FLP Hyperglycemia: Last A1c is stable. HTN: BP was 104 /70 one time. no symptoms at the time, recommend to continue amlodipine , losartan , if BP is consistently low let me know High cholesterol: On Crestor  10 mg, last LDL above goal, no changes made.  Recheck today. Gait: Using cane. Praised RTC 6 months.

## 2023-12-22 LAB — CBC WITH DIFFERENTIAL/PLATELET
Basophils Absolute: 0.1 K/uL (ref 0.0–0.1)
Basophils Relative: 0.9 % (ref 0.0–3.0)
Eosinophils Absolute: 0.2 K/uL (ref 0.0–0.7)
Eosinophils Relative: 1.9 % (ref 0.0–5.0)
HCT: 41.9 % (ref 39.0–52.0)
Hemoglobin: 13.9 g/dL (ref 13.0–17.0)
Lymphocytes Relative: 23.1 % (ref 12.0–46.0)
Lymphs Abs: 2 K/uL (ref 0.7–4.0)
MCHC: 33.2 g/dL (ref 30.0–36.0)
MCV: 95.4 fl (ref 78.0–100.0)
Monocytes Absolute: 0.7 K/uL (ref 0.1–1.0)
Monocytes Relative: 8.1 % (ref 3.0–12.0)
Neutro Abs: 5.7 K/uL (ref 1.4–7.7)
Neutrophils Relative %: 66 % (ref 43.0–77.0)
Platelets: 209 K/uL (ref 150.0–400.0)
RBC: 4.39 Mil/uL (ref 4.22–5.81)
RDW: 13.3 % (ref 11.5–15.5)
WBC: 8.6 K/uL (ref 4.0–10.5)

## 2023-12-22 LAB — URINE CULTURE
MICRO NUMBER:: 16671203
Result:: NO GROWTH
SPECIMEN QUALITY:: ADEQUATE

## 2023-12-22 LAB — BASIC METABOLIC PANEL WITH GFR
BUN: 19 mg/dL (ref 6–23)
CO2: 27 meq/L (ref 19–32)
Calcium: 9.5 mg/dL (ref 8.4–10.5)
Chloride: 108 meq/L (ref 96–112)
Creatinine, Ser: 1.33 mg/dL (ref 0.40–1.50)
GFR: 46.73 mL/min — ABNORMAL LOW (ref >60.00)
Glucose, Bld: 100 mg/dL — ABNORMAL HIGH (ref 70–99)
Potassium: 3.9 meq/L (ref 3.5–5.1)
Sodium: 144 meq/L (ref 135–145)

## 2023-12-22 NOTE — Assessment & Plan Note (Signed)
 HTN: BP looks very good, continue losartan , amlodipine , checking labs. High cholesterol: On Crestor , last LDL very good. H/o stroke, continue aspirin  BPH.  Reports a history of BPH and OAB, has not seen urology in a while, having nocturia.  Myrbetriq apparently caused urinary retention per last urology note. Plan: UA urine culture, trial with Flomax  Shoulder pain: Per Ortho Cerumen impaction:  With the patient permission I introduced a spoon to the left ear canal, a fair amount of wax was removed.  TM normal. Attempted the same on the R side, but was unable to remove wax (canal very narrow), recommend Debrox. RTC 4   months

## 2023-12-24 ENCOUNTER — Ambulatory Visit: Payer: Self-pay | Admitting: Internal Medicine

## 2023-12-24 MED ORDER — EPINEPHRINE 0.3 MG/0.3ML IJ SOAJ: 0.3000 mg | INTRAMUSCULAR | 2 refills | Status: AC | PRN

## 2023-12-28 DIAGNOSIS — I639 Cerebral infarction, unspecified: Secondary | ICD-10-CM | POA: Diagnosis not present

## 2023-12-28 DIAGNOSIS — H5319 Other subjective visual disturbances: Secondary | ICD-10-CM | POA: Diagnosis not present

## 2023-12-28 DIAGNOSIS — H43812 Vitreous degeneration, left eye: Secondary | ICD-10-CM | POA: Diagnosis not present

## 2023-12-28 DIAGNOSIS — H02831 Dermatochalasis of right upper eyelid: Secondary | ICD-10-CM | POA: Diagnosis not present

## 2023-12-28 DIAGNOSIS — H02834 Dermatochalasis of left upper eyelid: Secondary | ICD-10-CM | POA: Diagnosis not present

## 2023-12-28 DIAGNOSIS — H04123 Dry eye syndrome of bilateral lacrimal glands: Secondary | ICD-10-CM | POA: Diagnosis not present

## 2023-12-28 DIAGNOSIS — Z961 Presence of intraocular lens: Secondary | ICD-10-CM | POA: Diagnosis not present

## 2023-12-30 DIAGNOSIS — M25512 Pain in left shoulder: Secondary | ICD-10-CM | POA: Diagnosis not present

## 2023-12-30 DIAGNOSIS — M25511 Pain in right shoulder: Secondary | ICD-10-CM | POA: Diagnosis not present

## 2023-12-31 ENCOUNTER — Other Ambulatory Visit: Payer: Self-pay | Admitting: Internal Medicine

## 2024-02-18 ENCOUNTER — Other Ambulatory Visit: Payer: Self-pay | Admitting: Internal Medicine

## 2024-03-18 ENCOUNTER — Other Ambulatory Visit: Payer: Self-pay | Admitting: Internal Medicine

## 2024-04-24 ENCOUNTER — Ambulatory Visit: Admitting: Internal Medicine

## 2024-05-09 ENCOUNTER — Ambulatory Visit (INDEPENDENT_AMBULATORY_CARE_PROVIDER_SITE_OTHER): Admitting: Family

## 2024-05-09 ENCOUNTER — Encounter: Payer: Self-pay | Admitting: Family

## 2024-05-09 VITALS — BP 122/70 | HR 91 | Temp 97.9°F | Resp 18 | Ht 66.5 in | Wt 159.0 lb

## 2024-05-09 DIAGNOSIS — R739 Hyperglycemia, unspecified: Secondary | ICD-10-CM | POA: Diagnosis not present

## 2024-05-09 DIAGNOSIS — N529 Male erectile dysfunction, unspecified: Secondary | ICD-10-CM | POA: Diagnosis not present

## 2024-05-09 DIAGNOSIS — E876 Hypokalemia: Secondary | ICD-10-CM

## 2024-05-09 DIAGNOSIS — I10 Essential (primary) hypertension: Secondary | ICD-10-CM | POA: Diagnosis not present

## 2024-05-09 DIAGNOSIS — E785 Hyperlipidemia, unspecified: Secondary | ICD-10-CM | POA: Diagnosis not present

## 2024-05-09 NOTE — Progress Notes (Signed)
 Acute Office Visit  Subjective:     Patient ID: Cory Zavala, male    DOB: 1930/09/13, 89 y.o.   MRN: [id number]  Chief Complaint  Patient presents with  . Follow-up    4 months    HPI Patient is in today for follow-up of his chronic conditions.  Patient has a history of hypertension, hyperlipidemia, GERD, BPH, and erectile dysfunction.  Reports he is doing well.  Has had his flu shot this year.  Does not routinely exercise but does yard work around the house.  He is a optician, dispensing and still actively preaching.  Review of Systems  Musculoskeletal:        Bilateral shoulder pain  All other systems reviewed and are negative.       Objective:    BP 122/70   Pulse 91   Temp 97.9 F (36.6 C)   Resp 18   Ht 5' 6.5 (1.689 m)   Wt 159 lb (72.1 kg)   SpO2 93%   BMI 25.28 kg/m    Physical Exam Vitals and nursing note reviewed.  Constitutional:      Appearance: Normal appearance. He is normal weight.  HENT:     Right Ear: Tympanic membrane, ear canal and external ear normal.     Left Ear: Tympanic membrane, ear canal and external ear normal.  Cardiovascular:     Rate and Rhythm: Normal rate and regular rhythm.     Pulses: Normal pulses.     Heart sounds: Murmur heard.  Pulmonary:     Effort: Pulmonary effort is normal.     Breath sounds: Normal breath sounds.  Abdominal:     General: Abdomen is flat. Bowel sounds are normal.     Palpations: Abdomen is soft.  Musculoskeletal:        General: Normal range of motion.     Cervical back: Normal range of motion and neck supple.  Skin:    General: Skin is warm and dry.  Neurological:     General: No focal deficit present.     Mental Status: He is alert and oriented to person, place, and time. Mental status is at baseline.  Psychiatric:        Mood and Affect: Mood normal.        Behavior: Behavior normal.        Thought Content: Thought content normal.     No results found for any visits on 05/09/24.       Assessment & Plan:   Problem List Items Addressed This Visit     Hypokalemia   Relevant Orders   Basic Metabolic Panel (BMET)   Hepatic function panel   CBC w/Diff   Hyperglycemia - Primary   Relevant Orders   Basic Metabolic Panel (BMET)   Hepatic function panel   HgB A1c   CBC w/Diff   Essential hypertension   Relevant Orders   Basic Metabolic Panel (BMET)   Hepatic function panel   CBC w/Diff   Erectile dysfunction   Relevant Orders   Hepatic function panel   CBC w/Diff   Other Visit Diagnoses       Hyperlipidemia, unspecified hyperlipidemia type       Relevant Orders   Lipid panel   Hepatic function panel   CBC w/Diff       No orders of the defined types were placed in this encounter.  Call the office with any questions or concerns.  Recheck as scheduled and sooner as needed.  Return in about 4 months (around 09/06/2024).  English Craig[place] B Kinsey Cowsert, FNP

## 2024-05-10 ENCOUNTER — Ambulatory Visit: Payer: Self-pay | Admitting: Family

## 2024-05-10 LAB — LIPID PANEL
Cholesterol: 139 mg/dL (ref 0–200)
HDL: 52.4 mg/dL (ref >39.00)
LDL Cholesterol: 71 mg/dL (ref 0–99)
NonHDL: 86.15
Total CHOL/HDL Ratio: 3
Triglycerides: 77 mg/dL (ref 0.0–149.0)
VLDL: 15.4 mg/dL (ref 0.0–40.0)

## 2024-05-10 LAB — HEPATIC FUNCTION PANEL
ALT: 11 U/L (ref 0–53)
AST: 14 U/L (ref 0–37)
Albumin: 4.1 g/dL (ref 3.5–5.2)
Alkaline Phosphatase: 77 U/L (ref 39–117)
Bilirubin, Direct: 0.1 mg/dL (ref 0.0–0.3)
Total Bilirubin: 0.6 mg/dL (ref 0.2–1.2)
Total Protein: 6.2 g/dL (ref 6.0–8.3)

## 2024-05-10 LAB — BASIC METABOLIC PANEL WITH GFR
BUN: 27 mg/dL — ABNORMAL HIGH (ref 6–23)
CO2: 27 meq/L (ref 19–32)
Calcium: 9.7 mg/dL (ref 8.4–10.5)
Chloride: 107 meq/L (ref 96–112)
Creatinine, Ser: 1.41 mg/dL (ref 0.40–1.50)
GFR: 43.45 mL/min — ABNORMAL LOW (ref >60.00)
Glucose, Bld: 115 mg/dL — ABNORMAL HIGH (ref 70–99)
Potassium: 4 meq/L (ref 3.5–5.1)
Sodium: 143 meq/L (ref 135–145)

## 2024-05-10 LAB — HEMOGLOBIN A1C: Hgb A1c MFr Bld: 5.8 % (ref 4.6–6.5)

## 2024-05-10 LAB — CBC WITH DIFFERENTIAL/PLATELET
Basophils Absolute: 0.1 K/uL (ref 0.0–0.1)
Basophils Relative: 1.3 % (ref 0.0–3.0)
Eosinophils Absolute: 0.3 K/uL (ref 0.0–0.7)
Eosinophils Relative: 3.4 % (ref 0.0–5.0)
HCT: 40.1 % (ref 39.0–52.0)
Hemoglobin: 13.4 g/dL (ref 13.0–17.0)
Lymphocytes Relative: 29.4 % (ref 12.0–46.0)
Lymphs Abs: 2.3 K/uL (ref 0.7–4.0)
MCHC: 33.4 g/dL (ref 30.0–36.0)
MCV: 94.4 fl (ref 78.0–100.0)
Monocytes Absolute: 0.8 K/uL (ref 0.1–1.0)
Monocytes Relative: 10.2 % (ref 3.0–12.0)
Neutro Abs: 4.4 K/uL (ref 1.4–7.7)
Neutrophils Relative %: 55.7 % (ref 43.0–77.0)
Platelets: 236 K/uL (ref 150.0–400.0)
RBC: 4.25 Mil/uL (ref 4.22–5.81)
RDW: 13.1 % (ref 11.5–15.5)
WBC: 7.8 K/uL (ref 4.0–10.5)

## 2024-05-11 ENCOUNTER — Other Ambulatory Visit: Payer: Self-pay | Admitting: Internal Medicine

## 2024-06-09 ENCOUNTER — Other Ambulatory Visit: Payer: Self-pay | Admitting: Internal Medicine

## 2024-06-30 ENCOUNTER — Other Ambulatory Visit: Payer: Self-pay | Admitting: Internal Medicine

## 2024-07-16 ENCOUNTER — Other Ambulatory Visit: Payer: Self-pay | Admitting: Internal Medicine

## 2024-07-17 ENCOUNTER — Other Ambulatory Visit: Payer: Self-pay

## 2024-07-17 MED ORDER — PANTOPRAZOLE SODIUM 40 MG PO TBEC: 40.0000 mg | DELAYED_RELEASE_TABLET | Freq: Two times a day (BID) | ORAL | 1 refills | Status: AC

## 2024-09-06 ENCOUNTER — Ambulatory Visit: Admitting: Internal Medicine

## 2024-12-20 ENCOUNTER — Ambulatory Visit
